# Patient Record
Sex: Female | Born: 1980 | Race: White | Hispanic: No | Marital: Married | Smoking: Never smoker
Health system: Southern US, Community
[De-identification: ages and names within clinical notes are randomized; demographics above are authoritative.]

## PROBLEM LIST (undated history)

## (undated) ENCOUNTER — Ambulatory Visit: Discharge status: Absent without leave | Payer: BC Managed Care – PPO

## (undated) DIAGNOSIS — F509 Eating disorder, unspecified: Secondary | ICD-10-CM

## (undated) DIAGNOSIS — F32A Depression, unspecified: Secondary | ICD-10-CM

## (undated) DIAGNOSIS — F329 Major depressive disorder, single episode, unspecified: Secondary | ICD-10-CM

## (undated) DIAGNOSIS — U071 COVID-19: Secondary | ICD-10-CM

## (undated) DIAGNOSIS — G43909 Migraine, unspecified, not intractable, without status migrainosus: Secondary | ICD-10-CM

## (undated) HISTORY — DX: Depression, unspecified: F32.A

## (undated) HISTORY — DX: Eating disorder, unspecified: F50.9

## (undated) HISTORY — DX: Major depressive disorder, single episode, unspecified: F32.9

## (undated) HISTORY — DX: Migraine, unspecified, not intractable, without status migrainosus: G43.909

## (undated) HISTORY — DX: COVID-19: U07.1

---

## 2004-07-21 ENCOUNTER — Ambulatory Visit: Payer: Self-pay | Admitting: Otolaryngology

## 2004-07-24 HISTORY — PX: TONSILLECTOMY: SUR1361

## 2005-10-30 ENCOUNTER — Ambulatory Visit: Payer: Self-pay | Admitting: Otolaryngology

## 2006-07-13 ENCOUNTER — Ambulatory Visit: Payer: Self-pay | Admitting: Family Medicine

## 2007-02-18 ENCOUNTER — Ambulatory Visit: Payer: Self-pay | Admitting: Gastroenterology

## 2007-07-25 HISTORY — PX: LAPAROSCOPIC GASTRIC BANDING: SHX1100

## 2007-07-25 HISTORY — PX: CHOLECYSTECTOMY: SHX55

## 2012-05-30 LAB — HM PAP SMEAR: HM Pap smear: NORMAL

## 2012-07-24 HISTORY — PX: UPPER GI ENDOSCOPY: SHX6162

## 2012-07-25 ENCOUNTER — Ambulatory Visit: Payer: Self-pay | Admitting: Specialist

## 2012-07-25 DIAGNOSIS — Z01810 Encounter for preprocedural cardiovascular examination: Secondary | ICD-10-CM

## 2012-07-26 ENCOUNTER — Other Ambulatory Visit: Payer: Self-pay | Admitting: Specialist

## 2012-07-26 LAB — COMPREHENSIVE METABOLIC PANEL
Albumin: 3.2 g/dL — ABNORMAL LOW (ref 3.4–5.0)
Alkaline Phosphatase: 71 U/L (ref 50–136)
Anion Gap: 7 (ref 7–16)
BUN: 12 mg/dL (ref 7–18)
Bilirubin,Total: 0.3 mg/dL (ref 0.2–1.0)
Calcium, Total: 8.8 mg/dL (ref 8.5–10.1)
Chloride: 108 mmol/L — ABNORMAL HIGH (ref 98–107)
Co2: 24 mmol/L (ref 21–32)
Creatinine: 0.74 mg/dL (ref 0.60–1.30)
EGFR (African American): >60
EGFR (Non-African Amer.): >60
Glucose: 89 mg/dL (ref 65–99)
Osmolality: 277 (ref 275–301)
Potassium: 3.9 mmol/L (ref 3.5–5.1)
SGOT(AST): 13 U/L — ABNORMAL LOW (ref 15–37)
SGPT (ALT): 19 U/L (ref 12–78)
Sodium: 139 mmol/L (ref 136–145)
Total Protein: 7.5 g/dL (ref 6.4–8.2)

## 2012-07-26 LAB — CBC WITH DIFFERENTIAL/PLATELET
Basophil #: 0 10*3/uL (ref 0.0–0.1)
Basophil %: 0.5 %
Eosinophil #: 0.2 10*3/uL (ref 0.0–0.7)
Eosinophil %: 2.9 %
HCT: 38.8 % (ref 35.0–47.0)
HGB: 12.9 g/dL (ref 12.0–16.0)
Lymphocyte #: 2.2 10*3/uL (ref 1.0–3.6)
Lymphocyte %: 25.7 %
MCH: 26.3 pg (ref 26.0–34.0)
MCHC: 33.3 g/dL (ref 32.0–36.0)
MCV: 79 fL — ABNORMAL LOW (ref 80–100)
Monocyte #: 0.5 x10 3/mm (ref 0.2–0.9)
Monocyte %: 6.1 %
Neutrophil #: 5.4 10*3/uL (ref 1.4–6.5)
Neutrophil %: 64.8 %
Platelet: 319 10*3/uL (ref 150–440)
RBC: 4.91 10*6/uL (ref 3.80–5.20)
RDW: 15.5 % — ABNORMAL HIGH (ref 11.5–14.5)
WBC: 8.4 10*3/uL (ref 3.6–11.0)

## 2012-07-26 LAB — PHOSPHORUS: Phosphorus: 3 mg/dL (ref 2.5–4.9)

## 2012-07-26 LAB — AMYLASE: Amylase: 58 U/L (ref 25–115)

## 2012-07-26 LAB — IRON AND TIBC
Iron Bind.Cap.(Total): 502 ug/dL — ABNORMAL HIGH (ref 250–450)
Iron Saturation: 15 %
Iron: 73 ug/dL (ref 50–170)
Unbound Iron-Bind.Cap.: 429 ug/dL

## 2012-07-26 LAB — FERRITIN: Ferritin (ARMC): 25 ng/mL (ref 8–388)

## 2012-07-26 LAB — TSH: Thyroid Stimulating Horm: 1.42 u[IU]/mL

## 2012-07-26 LAB — FOLATE: Folic Acid: 18.9 ng/mL (ref 3.1–100.0)

## 2012-07-26 LAB — PROTIME-INR
INR: 0.8
Prothrombin Time: 11.5 secs (ref 11.5–14.7)

## 2012-07-26 LAB — APTT: Activated PTT: 24.3 secs (ref 23.6–35.9)

## 2012-07-26 LAB — HEMOGLOBIN A1C: Hemoglobin A1C: 5.3 % (ref 4.2–6.3)

## 2012-07-26 LAB — LIPASE, BLOOD: Lipase: 296 U/L (ref 73–393)

## 2012-07-26 LAB — MAGNESIUM: Magnesium: 2 mg/dL

## 2012-07-26 LAB — BILIRUBIN, DIRECT: Bilirubin, Direct: <0.1 mg/dL (ref 0.00–0.20)

## 2012-08-02 ENCOUNTER — Ambulatory Visit: Payer: Self-pay | Admitting: Specialist

## 2012-08-30 ENCOUNTER — Encounter: Payer: Self-pay | Admitting: Internal Medicine

## 2012-08-30 ENCOUNTER — Ambulatory Visit (INDEPENDENT_AMBULATORY_CARE_PROVIDER_SITE_OTHER): Payer: BC Managed Care – PPO | Admitting: Internal Medicine

## 2012-08-30 DIAGNOSIS — E669 Obesity, unspecified: Secondary | ICD-10-CM | POA: Insufficient documentation

## 2012-08-30 DIAGNOSIS — G43909 Migraine, unspecified, not intractable, without status migrainosus: Secondary | ICD-10-CM

## 2012-08-30 DIAGNOSIS — F32A Depression, unspecified: Secondary | ICD-10-CM | POA: Insufficient documentation

## 2012-08-30 DIAGNOSIS — F3289 Other specified depressive episodes: Secondary | ICD-10-CM

## 2012-08-30 DIAGNOSIS — F329 Major depressive disorder, single episode, unspecified: Secondary | ICD-10-CM

## 2012-08-30 MED ORDER — DESVENLAFAXINE SUCCINATE ER 50 MG PO TB24: 50.0000 mg | ORAL_TABLET | Freq: Every day | ORAL | Status: DC

## 2012-08-30 NOTE — Assessment & Plan Note (Signed)
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 Patient would like to stop medications. Will taper Pristique to 50 mg daily. For now, we'll continue fluoxetine. Patient will followup in one month or sooner as needed.

## 2012-08-30 NOTE — Progress Notes (Signed)
Subjective:    Patient ID: Sue Hernandez, female    DOB: September 20, 1980, 31 y.o.   MRN: [id number]  HPI 32 year old female with history of obesity, depression, and migraine headaches presents to establish care. She reports that she had lap-band surgery several years ago. After the surgery, she experienced persistent nausea and vomiting. Ultimately, the band had to be released and she regained 75lbs. she has now sought the care of another bariatric surgeon and they're planning to do gastric sleeve procedure. She is currently keeping a food diary and is on a six-month schedule of monthly visits prior to her surgical procedure which should be in June.   She also notes a history of chronic depression. She describes this as low level depression. She has been able to function and works as a Runner, broadcasting/film/video. She has been on several medications to help with symptoms. She is currently taking proceed and fluoxetine. She reports minimal improvement with these medications. She is hoping to get off the medicines. She is currently under the care of a psychologist who she reports agrees with her plan to get off medicines. She denies suicidal or homicidal ideation. She has no history of suicide attempts in the past.  She also is a history of migraine headaches. She reports symptoms are very well-controlled with use of propanolol. She denies any recent headaches.  Outpatient Encounter Prescriptions as of 08/30/2012  Medication Sig Dispense Refill  . desvenlafaxine (PRISTIQ) 50 MG 24 hr tablet Take 1 tablet (50 mg total) by mouth daily.  30 tablet  6  . etonogestrel-ethinyl estradiol (NUVARING) 0.12-0.015 MG/24HR vaginal ring Place 1 each vaginally every 28 (twenty-eight) days. Insert vaginally and leave in place for 3 consecutive weeks, then remove for 1 week.      Marland Kitchen FLUoxetine (PROZAC) 10 MG capsule Take 10 mg by mouth Daily.      . Multiple Vitamin (MULTIVITAMIN) capsule Take 1 capsule by mouth daily.      . propranolol  (INDERAL) 10 MG tablet Take 10 mg by mouth Daily.      . vitamin B-12 (CYANOCOBALAMIN) 1000 MCG tablet Take 1,000 mcg by mouth daily.      . [DISCONTINUED] PRISTIQ 100 MG 24 hr tablet Take 100 mg by mouth Daily.       BP 120/79  Pulse 104  Temp 98.3 F (36.8 C) (Oral)  Ht 5\' 4"  (1.626 m)  Wt 244 lb (110.678 kg)  BMI 41.88 kg/m2  SpO2 98%  LMP 08/16/2012  Review of Systems  Constitutional: Negative for fever, chills, appetite change, fatigue and unexpected weight change.  HENT: Negative for ear pain, congestion, sore throat, trouble swallowing, neck pain, voice change and sinus pressure.   Eyes: Negative for visual disturbance.  Respiratory: Negative for cough, shortness of breath, wheezing and stridor.   Cardiovascular: Negative for chest pain, palpitations and leg swelling.  Gastrointestinal: Negative for nausea, vomiting, abdominal pain, diarrhea, constipation, blood in stool, abdominal distention and anal bleeding.  Genitourinary: Negative for dysuria and flank pain.  Musculoskeletal: Negative for myalgias, arthralgias and gait problem.  Skin: Negative for color change and rash.  Neurological: Negative for dizziness and headaches.  Hematological: Negative for adenopathy. Does not bruise/bleed easily.  Psychiatric/Behavioral: Positive for dysphoric mood. Negative for suicidal ideas and sleep disturbance. The patient is not nervous/anxious.        Objective:   Physical Exam  Constitutional: She is oriented to person, place, and time. She appears well-developed and well-nourished. No distress.  HENT:  Head: Normocephalic  and atraumatic.  Right Ear: External ear normal.  Left Ear: External ear normal.  Nose: Nose normal.  Mouth/Throat: Oropharynx is clear and moist. No oropharyngeal exudate.  Eyes: Conjunctivae normal are normal. Pupils are equal, round, and reactive to light. Right eye exhibits no discharge. Left eye exhibits no discharge. No scleral icterus.  Neck: Normal  range of motion. Neck supple. No tracheal deviation present. No thyromegaly present.  Cardiovascular: Normal rate, regular rhythm, normal heart sounds and intact distal pulses.  Exam reveals no gallop and no friction rub.   No murmur heard. Pulmonary/Chest: Effort normal and breath sounds normal. No respiratory distress. She has no wheezes. She has no rales. She exhibits no tenderness.  Musculoskeletal: Normal range of motion. She exhibits no edema and no tenderness.  Lymphadenopathy:    She has no cervical adenopathy.  Neurological: She is alert and oriented to person, place, and time. No cranial nerve deficit. She exhibits normal muscle tone. Coordination normal.  Skin: Skin is warm and dry. No rash noted. She is not diaphoretic. No erythema. No pallor.  Psychiatric: She has a normal mood and affect. Her behavior is normal. Judgment and thought content normal.          Assessment & Plan:

## 2012-08-30 NOTE — Assessment & Plan Note (Signed)
Symptoms well controlled on propanolol. Will continue.

## 2012-08-30 NOTE — Assessment & Plan Note (Signed)
Body mass index is 41.88 kg/(m^2). Patient is planning for gastric sleeve procedure. Will request records on previous evaluation and management from her bariatric surgeon. Discussed CBT to help in weight loss process. Gave reference of Feeling Good by Burns.

## 2012-10-09 ENCOUNTER — Ambulatory Visit (INDEPENDENT_AMBULATORY_CARE_PROVIDER_SITE_OTHER): Payer: BC Managed Care – PPO | Admitting: Internal Medicine

## 2012-10-09 ENCOUNTER — Ambulatory Visit: Payer: BC Managed Care – PPO | Admitting: Internal Medicine

## 2012-10-09 ENCOUNTER — Encounter: Payer: Self-pay | Admitting: Internal Medicine

## 2012-10-09 VITALS — BP 118/80 | HR 78 | Temp 98.2°F | Wt 250.0 lb

## 2012-10-09 DIAGNOSIS — F32A Depression, unspecified: Secondary | ICD-10-CM

## 2012-10-09 DIAGNOSIS — F3289 Other specified depressive episodes: Secondary | ICD-10-CM

## 2012-10-09 DIAGNOSIS — F329 Major depressive disorder, single episode, unspecified: Secondary | ICD-10-CM

## 2012-10-09 NOTE — Assessment & Plan Note (Signed)
Symptoms stable with decrease in Pristiq to 50mg  daily. Pt would like to taper off medication. Will stop medication. Continue Fluoxetine. If any problems coming off Pristiq, pt will call or email. Follow up 1 month and prn.

## 2012-10-09 NOTE — Progress Notes (Signed)
Subjective:    Patient ID: Sue Hernandez, female    DOB: Jun 06, 1981, 31 y.o.   MRN: [id number]  HPI 32 year old female with history of depression and anxiety presents for followup. At her last visit, we tapered down her dose of Pristiq to 50 mg daily. She reports no change in symptoms with tapering of medication. She would like to come off this medication. She would like to see how she deals with symptoms of anxiety and depression without use of medication. She feels that she is coping well at this point. She denies any new concerns today.  Outpatient Encounter Prescriptions as of 10/09/2012  Medication Sig Dispense Refill  . desvenlafaxine (PRISTIQ) 50 MG 24 hr tablet Take 1 tablet (50 mg total) by mouth daily.  30 tablet  6  . etonogestrel-ethinyl estradiol (NUVARING) 0.12-0.015 MG/24HR vaginal ring Place 1 each vaginally every 28 (twenty-eight) days. Insert vaginally and leave in place for 3 consecutive weeks, then remove for 1 week.      Marland Kitchen FLUoxetine (PROZAC) 10 MG capsule Take 10 mg by mouth Daily.      . Multiple Vitamin (MULTIVITAMIN) capsule Take 1 capsule by mouth daily.      . propranolol (INDERAL) 10 MG tablet Take 10 mg by mouth Daily.      . vitamin B-12 (CYANOCOBALAMIN) 1000 MCG tablet Take 1,000 mcg by mouth daily.       No facility-administered encounter medications on file as of 10/09/2012.   BP 118/80  Pulse 78  Temp(Src) 98.2 F (36.8 C) (Oral)  Wt 250 lb (113.399 kg)  BMI 42.89 kg/m2  SpO2 97%  LMP 09/11/2012  Review of Systems  Constitutional: Negative for fever, chills, appetite change, fatigue and unexpected weight change.  HENT: Negative for ear pain, congestion, sore throat, trouble swallowing, neck pain, voice change and sinus pressure.   Eyes: Negative for visual disturbance.  Respiratory: Negative for cough, shortness of breath, wheezing and stridor.   Cardiovascular: Negative for chest pain, palpitations and leg swelling.  Gastrointestinal: Negative for  nausea, vomiting, abdominal pain, diarrhea, constipation, blood in stool, abdominal distention and anal bleeding.  Genitourinary: Negative for dysuria and flank pain.  Musculoskeletal: Negative for myalgias, arthralgias and gait problem.  Skin: Negative for color change and rash.  Neurological: Negative for dizziness and headaches.  Hematological: Negative for adenopathy. Does not bruise/bleed easily.  Psychiatric/Behavioral: Negative for suicidal ideas, sleep disturbance and dysphoric mood. The patient is not nervous/anxious.        Objective:   Physical Exam  Constitutional: She is oriented to person, place, and time. She appears well-developed and well-nourished. No distress.  HENT:  Head: Normocephalic and atraumatic.  Right Ear: External ear normal.  Left Ear: External ear normal.  Nose: Nose normal.  Mouth/Throat: Oropharynx is clear and moist.  Eyes: Conjunctivae are normal. Pupils are equal, round, and reactive to light. Right eye exhibits no discharge. Left eye exhibits no discharge. No scleral icterus.  Neck: Normal range of motion. Neck supple. No tracheal deviation present. No thyromegaly present.  Cardiovascular: Normal rate, regular rhythm, normal heart sounds and intact distal pulses.  Exam reveals no gallop and no friction rub.   No murmur heard. Pulmonary/Chest: Effort normal and breath sounds normal. No respiratory distress. She has no wheezes. She has no rales. She exhibits no tenderness.  Musculoskeletal: Normal range of motion. She exhibits no edema and no tenderness.  Lymphadenopathy:    She has no cervical adenopathy.  Neurological: She is alert and oriented to  person, place, and time. No cranial nerve deficit. She exhibits normal muscle tone. Coordination normal.  Skin: Skin is warm and dry. No rash noted. She is not diaphoretic. No erythema. No pallor.  Psychiatric: She has a normal mood and affect. Her behavior is normal. Judgment and thought content normal.           Assessment & Plan:

## 2012-10-09 NOTE — Patient Instructions (Signed)
Stop Pristique. Follow up with email next week and then visit in 1 month.

## 2012-10-14 ENCOUNTER — Encounter: Payer: Self-pay | Admitting: Internal Medicine

## 2013-01-21 HISTORY — PX: LAPAROSCOPIC GASTRIC SLEEVE RESECTION: SHX5895

## 2013-02-10 ENCOUNTER — Encounter: Payer: Self-pay | Admitting: Adult Health

## 2013-02-10 ENCOUNTER — Ambulatory Visit: Payer: BC Managed Care – PPO | Admitting: Adult Health

## 2013-02-10 ENCOUNTER — Ambulatory Visit (INDEPENDENT_AMBULATORY_CARE_PROVIDER_SITE_OTHER): Payer: BC Managed Care – PPO | Admitting: Adult Health

## 2013-02-10 VITALS — BP 108/60 | HR 86 | Temp 98.1°F | Resp 12 | Wt 240.5 lb

## 2013-02-10 DIAGNOSIS — IMO0002 Reserved for concepts with insufficient information to code with codable children: Secondary | ICD-10-CM

## 2013-02-10 DIAGNOSIS — T8189XA Other complications of procedures, not elsewhere classified, initial encounter: Secondary | ICD-10-CM | POA: Insufficient documentation

## 2013-02-10 MED ORDER — TRIAMCINOLONE ACETONIDE 0.1 % EX CREA: TOPICAL_CREAM | Freq: Two times a day (BID) | CUTANEOUS | Status: DC

## 2013-02-10 MED ORDER — SULFAMETHOXAZOLE-TRIMETHOPRIM 200-40 MG/5ML PO SUSP: 10.0000 mL | Freq: Two times a day (BID) | ORAL | Status: DC

## 2013-02-10 NOTE — Patient Instructions (Addendum)
  Continue the augmentin  I have added another antibiotic - Bactrim. Take 10ml twice a day for 7 days.  Apply triamcinolone cream to the affected areas twice a day.  Please schedule an appointment with your surgeon for evaluation as soon as possible.

## 2013-02-10 NOTE — Assessment & Plan Note (Signed)
Patient is one week status post bariatric surgery who presents to clinic with irritation, hives on abdomen surrounding each incision. She also has diffuse rash. She was started on Augmentin by on-call surgeon. 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 Add Bactrim. Triamcinolone topical twice a day to affected areas causing significant itchiness. Discussed inability to use prednisone as this may impede healing process. Recommend seeing her surgeon as soon as possible. Patient called surgeon's office and obtained appointment for tomorrow at 11:30 AM.

## 2013-02-10 NOTE — Progress Notes (Signed)
  Subjective:    Patient ID: Sue Hernandez, female    DOB: 07/28/1980, 31 y.o.   MRN: [id number]  HPI  Sue Hernandez is a pleasant 32 year old female who presents to clinic status post bariatric surgery one week ago with complaints of redness, itching at the incision sites. She reports that the redness began yesterday morning. She has multiple incisions on her abdomen with redness and itching. She denies fever or drainage. She also reports a diffuse rash throughout her abdomen. She reports calling the surgeon's office but there was no one that was able to see her.   Review of Systems  Constitutional: Negative for fever and chills.  Respiratory: Negative.   Cardiovascular: Negative.   Skin: Positive for rash.       Multiple incisions on abdomen with redness and itching. Diffuse rash throughout abdomen  Neurological: Negative.   Psychiatric/Behavioral: The patient is nervous/anxious.      BP 108/60  Pulse 86  Temp(Src) 98.1 F (36.7 C) (Oral)  Resp 12  Wt 240 lb 8 oz (109.09 kg)  BMI 41.26 kg/m2  SpO2 97%    Objective:   Physical Exam  Constitutional: She is oriented to person, place, and time.  Cardiovascular: Normal rate and regular rhythm.   Pulmonary/Chest: Effort normal. No respiratory distress.  Neurological: She is alert and oriented to person, place, and time.  Skin: Rash noted. There is erythema.  Multiple incisions on the abdomen status post bariatric surgery one week ago with erythema, hives. She also has diffuse rash on the lateral sides of abdomen, lower abdomen. There is no drainage from any incision site. There is good approximation.  Psychiatric: She has a normal mood and affect. Her behavior is normal. Judgment and thought content normal.          Assessment & Plan:

## 2013-04-02 ENCOUNTER — Encounter: Payer: Self-pay | Admitting: Internal Medicine

## 2013-04-02 ENCOUNTER — Ambulatory Visit (INDEPENDENT_AMBULATORY_CARE_PROVIDER_SITE_OTHER): Payer: BC Managed Care – PPO | Admitting: Internal Medicine

## 2013-04-02 VITALS — BP 100/60 | HR 73 | Temp 98.2°F | Ht 64.0 in | Wt 225.0 lb

## 2013-04-02 DIAGNOSIS — F3289 Other specified depressive episodes: Secondary | ICD-10-CM

## 2013-04-02 DIAGNOSIS — F329 Major depressive disorder, single episode, unspecified: Secondary | ICD-10-CM

## 2013-04-02 DIAGNOSIS — F32A Depression, unspecified: Secondary | ICD-10-CM

## 2013-04-02 DIAGNOSIS — G43909 Migraine, unspecified, not intractable, without status migrainosus: Secondary | ICD-10-CM

## 2013-04-02 MED ORDER — PROPRANOLOL HCL 10 MG PO TABS: 10.0000 mg | ORAL_TABLET | Freq: Every day | ORAL | Status: DC

## 2013-04-02 MED ORDER — BUPROPION HCL ER (XL) 150 MG PO TB24: 150.0000 mg | ORAL_TABLET | Freq: Every day | ORAL | Status: DC

## 2013-04-02 NOTE — Assessment & Plan Note (Signed)
Wt Readings from Last 3 Encounters:  04/02/13 225 lb (102.059 kg)  02/10/13 240 lb 8 oz (109.09 kg)  10/09/12 250 lb (113.399 kg)   Body mass index is 38.6 kg/(m^2).  Congratulated pt on weight loss after gastric sleeve procedure. Encouraged her to continue effort at healthy diet and regular physical activity.

## 2013-04-02 NOTE — Progress Notes (Signed)
Subjective:    Patient ID: Sue Hernandez, female    DOB: [birth year], 32 y.o.   MRN: [id number]  HPI 32YO female with h/o depression, migraines, and obesity presents for follow up. Completed gastric sleeve procedure in 01/2013. Has lost 25lb since that time. Doing well with minimal nausea that is controlled by occasional Ondansetron.  Primary concern today increased symptoms of depressed mood and anxiety with ongoing family stressors. Currently her sister and sister's child are living with her. Also notes stress at work. Undergoing counseling and would like to restart Wellbutrin which worked well for her in the past.   Outpatient Encounter Prescriptions as of 04/02/2013  Medication Sig Dispense Refill  . Calcium 1200-1000 MG-UNIT CHEW Chew by mouth.      . etonogestrel-ethinyl estradiol (NUVARING) 0.12-0.015 MG/24HR vaginal ring Place 1 each vaginally every 28 (twenty-eight) days. Insert vaginally and leave in place for 3 consecutive weeks, then remove for 1 week.      . Prenatal Vit-Fe Fumarate-FA (M-VIT) tablet Take 1 tablet by mouth daily.      . propranolol (INDERAL) 10 MG tablet Take 1 tablet (10 mg total) by mouth daily.  30 tablet  6  . VITAMIN D, CHOLECALCIFEROL, PO Take by mouth.      . ondansetron (ZOFRAN-ODT) 4 MG disintegrating tablet        No facility-administered encounter medications on file as of 04/02/2013.   BP 100/60  Pulse 73  Temp(Src) 98.2 F (36.8 C) (Oral)  Ht 5\' 4"  (1.626 m)  Wt 225 lb (102.059 kg)  BMI 38.6 kg/m2  SpO2 97%  LMP 03/17/2013  Review of Systems  Constitutional: Negative for fever, chills, appetite change, fatigue and unexpected weight change.  HENT: Negative for ear pain, congestion, sore throat, trouble swallowing, neck pain, voice change and sinus pressure.   Eyes: Negative for visual disturbance.  Respiratory: Negative for cough, shortness of breath, wheezing and stridor.   Cardiovascular: Negative for chest pain, palpitations and leg  swelling.  Gastrointestinal: Negative for nausea, vomiting, abdominal pain, diarrhea, constipation, blood in stool, abdominal distention and anal bleeding.  Genitourinary: Negative for dysuria and flank pain.  Musculoskeletal: Negative for myalgias, arthralgias and gait problem.  Skin: Negative for color change and rash.  Neurological: Negative for dizziness and headaches.  Hematological: Negative for adenopathy. Does not bruise/bleed easily.  Psychiatric/Behavioral: Positive for dysphoric mood. Negative for suicidal ideas and sleep disturbance. The patient is not nervous/anxious.        Objective:   Physical Exam  Constitutional: She is oriented to person, place, and time. She appears well-developed and well-nourished. No distress.  HENT:  Head: Normocephalic and atraumatic.  Right Ear: External ear normal.  Left Ear: External ear normal.  Nose: Nose normal.  Mouth/Throat: Oropharynx is clear and moist. No oropharyngeal exudate.  Eyes: Conjunctivae are normal. Pupils are equal, round, and reactive to light. Right eye exhibits no discharge. Left eye exhibits no discharge. No scleral icterus.  Neck: Normal range of motion. Neck supple. No tracheal deviation present. No thyromegaly present.  Cardiovascular: Normal rate, regular rhythm, normal heart sounds and intact distal pulses.  Exam reveals no gallop and no friction rub.   No murmur heard. Pulmonary/Chest: Effort normal and breath sounds normal. No accessory muscle usage. Not tachypneic. No respiratory distress. She has no decreased breath sounds. She has no wheezes. She has no rhonchi. She has no rales. She exhibits no tenderness.  Musculoskeletal: Normal range of motion. She exhibits no edema and no tenderness.  Lymphadenopathy:    She has no cervical adenopathy.  Neurological: She is alert and oriented to person, place, and time. No cranial nerve deficit. She exhibits normal muscle tone. Coordination normal.  Skin: Skin is warm and  dry. No rash noted. She is not diaphoretic. No erythema. No pallor.  Psychiatric: She has a normal mood and affect. Her behavior is normal. Judgment and thought content normal. She expresses no suicidal ideation.          Assessment & Plan:

## 2013-04-02 NOTE — Assessment & Plan Note (Signed)
Symptoms well controlled with use of Propranolol 10mg  daily. Discussed that this is very low dose of medication. Pt may try stopping medication to see if any recurrent symptoms of headache.

## 2013-04-02 NOTE — Assessment & Plan Note (Signed)
Recent increased symptoms of depression and anxiety as pt deals with family stressors. Will restart Wellbutrin 150mg  daily. Follow up in 4 weeks or sooner as needed.

## 2013-05-05 ENCOUNTER — Ambulatory Visit: Payer: BC Managed Care – PPO | Admitting: Internal Medicine

## 2013-05-29 ENCOUNTER — Other Ambulatory Visit: Payer: Self-pay

## 2013-06-02 LAB — HM PAP SMEAR

## 2013-08-13 ENCOUNTER — Encounter: Payer: Self-pay | Admitting: Adult Health

## 2013-08-13 ENCOUNTER — Ambulatory Visit (INDEPENDENT_AMBULATORY_CARE_PROVIDER_SITE_OTHER): Payer: BC Managed Care – PPO | Admitting: Adult Health

## 2013-08-13 VITALS — BP 100/66 | HR 78 | Temp 98.3°F | Resp 12 | Wt 195.0 lb

## 2013-08-13 DIAGNOSIS — R509 Fever, unspecified: Secondary | ICD-10-CM

## 2013-08-13 DIAGNOSIS — R6889 Other general symptoms and signs: Secondary | ICD-10-CM

## 2013-08-13 DIAGNOSIS — J029 Acute pharyngitis, unspecified: Secondary | ICD-10-CM

## 2013-08-13 LAB — POCT INFLUENZA A/B
Influenza A, POC: NEGATIVE
Influenza B, POC: NEGATIVE

## 2013-08-13 LAB — POCT RAPID STREP A (OFFICE): Rapid Strep A Screen: NEGATIVE

## 2013-08-13 NOTE — Patient Instructions (Addendum)
  Take tylenol for general discomfort and fever  Flu test was negative  Strep test was negative.  Gargle with salt water. Ice cold drinks may soothe your throat.  Over the counter medications for symptoms such as Chloraseptic spray or lozenges.  If you develop a stuffy nose you can use Afrin for 3 days only.

## 2013-08-13 NOTE — Progress Notes (Signed)
   Subjective:    Patient ID: Sue Hernandez, female    DOB: 07/13/1981, 33 y.o.   MRN: [id number]  HPI  Patient is a pleasant 33 year old female who presents to clinic with flulike symptoms of sore throat, fever, chills and body ache. Symptoms began last night. She presents to clinic for evaluation. Denies cough, rash, shortness of breath.  Current Outpatient Prescriptions on File Prior to Visit  Medication Sig Dispense Refill  . buPROPion (WELLBUTRIN XL) 150 MG 24 hr tablet Take 1 tablet (150 mg total) by mouth daily.  30 tablet  6  . Calcium 1200-1000 MG-UNIT CHEW Chew by mouth.      . etonogestrel-ethinyl estradiol (NUVARING) 0.12-0.015 MG/24HR vaginal ring Place 1 each vaginally every 28 (twenty-eight) days. Insert vaginally and leave in place for 3 consecutive weeks, then remove for 1 week.      . ondansetron (ZOFRAN-ODT) 4 MG disintegrating tablet       . propranolol (INDERAL) 10 MG tablet Take 1 tablet (10 mg total) by mouth daily.  30 tablet  6  . VITAMIN D, CHOLECALCIFEROL, PO Take by mouth.       No current facility-administered medications on file prior to visit.     Review of Systems  Constitutional: Positive for fever and chills.  HENT: Positive for sore throat.   Respiratory: Negative for cough, shortness of breath and wheezing.   Cardiovascular: Negative.   Gastrointestinal: Negative.   Musculoskeletal:       Body aches, malaise  Neurological: Negative.   Psychiatric/Behavioral: Negative.        Objective:   Physical Exam  Constitutional: She is oriented to person, place, and time. She appears well-developed and well-nourished.  Appears acutely ill  HENT:  Head: Normocephalic and atraumatic.  Right Ear: External ear normal.  Left Ear: External ear normal.  Posterior pharynx with significant erythema.  Cardiovascular: Normal rate and regular rhythm.   Pulmonary/Chest: Effort normal. No respiratory distress.  Musculoskeletal: Normal range of motion.    Lymphadenopathy:    She has cervical adenopathy.  Neurological: She is alert and oriented to person, place, and time.  Skin: Skin is warm and dry.  Psychiatric: She has a normal mood and affect. Her behavior is normal. Judgment and thought content normal.          Assessment & Plan:

## 2013-08-13 NOTE — Assessment & Plan Note (Signed)
Negative for flu and strep. Drink fluids. Tylenol for general discomfort and fever. OTC medications for symptoms such as Chloraseptic spray or lozenges.

## 2013-08-13 NOTE — Progress Notes (Signed)
Pre visit review using our clinic review tool, if applicable. No additional management support is needed unless otherwise documented below in the visit note. 

## 2013-09-23 ENCOUNTER — Other Ambulatory Visit: Payer: Self-pay | Admitting: Specialist

## 2013-09-23 LAB — CBC WITH DIFFERENTIAL/PLATELET
Basophil #: 0 10*3/uL (ref 0.0–0.1)
Basophil %: 0.3 %
Eosinophil #: 0.1 10*3/uL (ref 0.0–0.7)
Eosinophil %: 1.7 %
HCT: 37.5 % (ref 35.0–47.0)
HGB: 12.6 g/dL (ref 12.0–16.0)
Lymphocyte #: 1.8 10*3/uL (ref 1.0–3.6)
Lymphocyte %: 26.8 %
MCH: 27.7 pg (ref 26.0–34.0)
MCHC: 33.7 g/dL (ref 32.0–36.0)
MCV: 82 fL (ref 80–100)
Monocyte #: 0.4 x10 3/mm (ref 0.2–0.9)
Monocyte %: 5.7 %
Neutrophil #: 4.3 10*3/uL (ref 1.4–6.5)
Neutrophil %: 65.5 %
Platelet: 310 10*3/uL (ref 150–440)
RBC: 4.55 10*6/uL (ref 3.80–5.20)
RDW: 13.8 % (ref 11.5–14.5)
WBC: 6.6 10*3/uL (ref 3.6–11.0)

## 2013-09-23 LAB — COMPREHENSIVE METABOLIC PANEL
Albumin: 3 g/dL — ABNORMAL LOW (ref 3.4–5.0)
Alkaline Phosphatase: 73 U/L
Anion Gap: 3 — ABNORMAL LOW (ref 7–16)
BUN: 11 mg/dL (ref 7–18)
Bilirubin,Total: 0.3 mg/dL (ref 0.2–1.0)
Calcium, Total: 8.7 mg/dL (ref 8.5–10.1)
Chloride: 110 mmol/L — ABNORMAL HIGH (ref 98–107)
Co2: 25 mmol/L (ref 21–32)
Creatinine: 0.93 mg/dL (ref 0.60–1.30)
EGFR (African American): >60
EGFR (Non-African Amer.): >60
Glucose: 102 mg/dL — ABNORMAL HIGH (ref 65–99)
Osmolality: 275 (ref 275–301)
Potassium: 3.6 mmol/L (ref 3.5–5.1)
SGOT(AST): 17 U/L (ref 15–37)
SGPT (ALT): 18 U/L (ref 12–78)
Sodium: 138 mmol/L (ref 136–145)
Total Protein: 7.1 g/dL (ref 6.4–8.2)

## 2013-09-23 LAB — IRON AND TIBC
Iron Bind.Cap.(Total): 451 ug/dL — ABNORMAL HIGH (ref 250–450)
Iron Saturation: 18 %
Iron: 82 ug/dL (ref 50–170)
Unbound Iron-Bind.Cap.: 369 ug/dL

## 2013-09-23 LAB — MAGNESIUM: Magnesium: 1.9 mg/dL

## 2013-09-23 LAB — PHOSPHORUS: Phosphorus: 3.3 mg/dL (ref 2.5–4.9)

## 2013-09-23 LAB — FOLATE: Folic Acid: 19.6 ng/mL (ref 3.1–100.0)

## 2013-09-23 LAB — FERRITIN: Ferritin (ARMC): 42 ng/mL (ref 8–388)

## 2013-09-23 LAB — AMYLASE: Amylase: 62 U/L (ref 25–115)

## 2013-09-30 ENCOUNTER — Encounter: Payer: Self-pay | Admitting: Internal Medicine

## 2013-09-30 ENCOUNTER — Ambulatory Visit (INDEPENDENT_AMBULATORY_CARE_PROVIDER_SITE_OTHER): Payer: BC Managed Care – PPO | Admitting: Internal Medicine

## 2013-09-30 VITALS — BP 108/68 | HR 63 | Temp 97.9°F | Wt 191.0 lb

## 2013-09-30 DIAGNOSIS — F3289 Other specified depressive episodes: Secondary | ICD-10-CM

## 2013-09-30 DIAGNOSIS — E669 Obesity, unspecified: Secondary | ICD-10-CM

## 2013-09-30 DIAGNOSIS — F329 Major depressive disorder, single episode, unspecified: Secondary | ICD-10-CM

## 2013-09-30 DIAGNOSIS — F32A Depression, unspecified: Secondary | ICD-10-CM

## 2013-09-30 MED ORDER — ESCITALOPRAM OXALATE 5 MG PO TABS: 5.0000 mg | ORAL_TABLET | Freq: Every day | ORAL | Status: DC

## 2013-09-30 NOTE — Progress Notes (Signed)
   Subjective:    Patient ID: Sue Hernandez, female    DOB: 06/12/1981, 33 y.o.   MRN: [id number]  HPI 32YO female with h/o depression and obesity s/p gastric sleeve presents for follow up. Continues to have some nausea, particularly in the morning. Taking Zofran with some improvement. Cannot tolerate protein shakes because of nausea. Sweet foods make nausea worse. Trying to get 60-80gm of protein per day. Eating chicken, cottage cheese. No abdominal pain. Symptoms of depression have recently been well controlled. Notes some decrease in Libido on Wellbutrin and would like to change to Lexapro, as this was recommended by her psychiatrist.     Review of Systems  Constitutional: Negative for fever, chills, appetite change, fatigue and unexpected weight change.  HENT: Negative for congestion, ear pain, sinus pressure, sore throat, trouble swallowing and voice change.   Eyes: Negative for visual disturbance.  Respiratory: Negative for cough, shortness of breath, wheezing and stridor.   Cardiovascular: Negative for chest pain, palpitations and leg swelling.  Gastrointestinal: Positive for nausea. Negative for vomiting, abdominal pain, diarrhea, constipation, blood in stool, abdominal distention and anal bleeding.  Genitourinary: Negative for dysuria and flank pain.  Musculoskeletal: Negative for arthralgias, gait problem, myalgias and neck pain.  Skin: Negative for color change and rash.  Neurological: Negative for dizziness and headaches.  Hematological: Negative for adenopathy. Does not bruise/bleed easily.  Psychiatric/Behavioral: Negative for suicidal ideas, sleep disturbance and dysphoric mood. The patient is not nervous/anxious.        Objective:    BP 108/68  Pulse 63  Temp(Src) 97.9 F (36.6 C) (Oral)  Wt 191 lb (86.637 kg)  SpO2 97%  LMP 09/30/2013 Physical Exam  Constitutional: She is oriented to person, place, and time. She appears well-developed and well-nourished. No  distress.  HENT:  Head: Normocephalic and atraumatic.  Right Ear: External ear normal.  Left Ear: External ear normal.  Nose: Nose normal.  Mouth/Throat: Oropharynx is clear and moist. No oropharyngeal exudate.  Eyes: Conjunctivae are normal. Pupils are equal, round, and reactive to light. Right eye exhibits no discharge. Left eye exhibits no discharge. No scleral icterus.  Neck: Normal range of motion. Neck supple. No tracheal deviation present. No thyromegaly present.  Cardiovascular: Normal rate, regular rhythm, normal heart sounds and intact distal pulses.  Exam reveals no gallop and no friction rub.   No murmur heard. Pulmonary/Chest: Effort normal and breath sounds normal. No accessory muscle usage. Not tachypneic. No respiratory distress. She has no decreased breath sounds. She has no wheezes. She has no rhonchi. She has no rales. She exhibits no tenderness.  Musculoskeletal: Normal range of motion. She exhibits no edema and no tenderness.  Lymphadenopathy:    She has no cervical adenopathy.  Neurological: She is alert and oriented to person, place, and time. No cranial nerve deficit. She exhibits normal muscle tone. Coordination normal.  Skin: Skin is warm and dry. No rash noted. She is not diaphoretic. No erythema. No pallor.  Psychiatric: She has a normal mood and affect. Her speech is normal and behavior is normal. Judgment and thought content normal. Cognition and memory are normal.          Assessment & Plan:   Problem List Items Addressed This Visit   Depression - Primary   Relevant Medications      escitalopram (LEXAPRO) tablet   Obesity (BMI 30-39.9)       Return in about 4 weeks (around 10/28/2013) for Follow up new medication.

## 2013-09-30 NOTE — Assessment & Plan Note (Signed)
Wt Readings from Last 3 Encounters:  09/30/13 191 lb (86.637 kg)  08/13/13 195 lb (88.451 kg)  04/02/13 225 lb (102.059 kg)   Congratulated pt on weight loss. Encouraged continued efforts at healthy diet and exercise. Will request recent labs from her bariatric surgeon.

## 2013-09-30 NOTE — Assessment & Plan Note (Signed)
Symptoms well controlled on Wellbutrin however pt notes decreased libido. We discussed that all SSRIs can have this side effect. Will give trial of Lexapro, starting at 5mg  daily. Follow up 4 weeks and prn.

## 2013-09-30 NOTE — Patient Instructions (Signed)
Email in 1-2 weeks with update regarding Lexapro.

## 2013-10-22 ENCOUNTER — Encounter: Payer: Self-pay | Admitting: *Deleted

## 2013-10-28 ENCOUNTER — Ambulatory Visit: Payer: Self-pay | Admitting: Specialist

## 2013-11-03 ENCOUNTER — Ambulatory Visit (INDEPENDENT_AMBULATORY_CARE_PROVIDER_SITE_OTHER): Payer: BC Managed Care – PPO | Admitting: Internal Medicine

## 2013-11-03 ENCOUNTER — Encounter: Payer: Self-pay | Admitting: Internal Medicine

## 2013-11-03 VITALS — BP 100/70 | HR 73 | Temp 98.2°F | Wt 196.0 lb

## 2013-11-03 DIAGNOSIS — F3289 Other specified depressive episodes: Secondary | ICD-10-CM

## 2013-11-03 DIAGNOSIS — L989 Disorder of the skin and subcutaneous tissue, unspecified: Secondary | ICD-10-CM | POA: Insufficient documentation

## 2013-11-03 DIAGNOSIS — R5383 Other fatigue: Secondary | ICD-10-CM

## 2013-11-03 DIAGNOSIS — F329 Major depressive disorder, single episode, unspecified: Secondary | ICD-10-CM

## 2013-11-03 DIAGNOSIS — R5381 Other malaise: Secondary | ICD-10-CM | POA: Insufficient documentation

## 2013-11-03 DIAGNOSIS — F32A Depression, unspecified: Secondary | ICD-10-CM

## 2013-11-03 LAB — CBC WITH DIFFERENTIAL/PLATELET
Basophils Absolute: 0 10*3/uL (ref 0.0–0.1)
Basophils Relative: 0.5 % (ref 0.0–3.0)
Eosinophils Absolute: 0.1 10*3/uL (ref 0.0–0.7)
Eosinophils Relative: 1.7 % (ref 0.0–5.0)
HCT: 38.8 % (ref 36.0–46.0)
Hemoglobin: 13 g/dL (ref 12.0–15.0)
Lymphocytes Relative: 28.8 % (ref 12.0–46.0)
Lymphs Abs: 2.1 10*3/uL (ref 0.7–4.0)
MCHC: 33.4 g/dL (ref 30.0–36.0)
MCV: 82.6 fl (ref 78.0–100.0)
Monocytes Absolute: 0.4 10*3/uL (ref 0.1–1.0)
Monocytes Relative: 6.1 % (ref 3.0–12.0)
Neutro Abs: 4.6 10*3/uL (ref 1.4–7.7)
Neutrophils Relative %: 62.9 % (ref 43.0–77.0)
Platelets: 309 10*3/uL (ref 150.0–400.0)
RBC: 4.7 Mil/uL (ref 3.87–5.11)
RDW: 13.6 % (ref 11.5–14.6)
WBC: 7.4 10*3/uL (ref 4.5–10.5)

## 2013-11-03 LAB — T4, FREE: Free T4: 0.83 ng/dL (ref 0.60–1.60)

## 2013-11-03 LAB — VITAMIN B12: Vitamin B-12: 438 pg/mL (ref 211–911)

## 2013-11-03 LAB — TSH: TSH: 1.72 u[IU]/mL (ref 0.35–5.50)

## 2013-11-03 MED ORDER — ESCITALOPRAM OXALATE 10 MG PO TABS: 10.0000 mg | ORAL_TABLET | Freq: Every day | ORAL | Status: DC

## 2013-11-03 MED ORDER — NYSTATIN-TRIAMCINOLONE 100000-0.1 UNIT/GM-% EX OINT: 1.0000 "application " | TOPICAL_OINTMENT | Freq: Two times a day (BID) | CUTANEOUS | Status: DC

## 2013-11-03 NOTE — Progress Notes (Signed)
Subjective:    Patient ID: Sue Hernandez, female    DOB: 02/12/1981, 33 y.o.   MRN: [id number]  HPI 32YO female presents for follow up.  Depression - Started Lexapro 5mg  last month. Notes some improvement in moodiness but not complete resolution of symptoms. Would like to increase dose of Lexapro.  Fatigue - Bariatric surgeon recently increased B12 with SL dosing. However, continues to feel extreme fatigue, particularly in the afternoons. No recent illnesses.  Skin lesion - scaling area of left leg x 1.5 weeks. No new lotions or creams. Not applying anything to area.  Review of Systems  Constitutional: Positive for fatigue. Negative for fever, chills, appetite change and unexpected weight change.  HENT: Negative for congestion, ear pain, sinus pressure, sore throat, trouble swallowing and voice change.   Eyes: Negative for visual disturbance.  Respiratory: Negative for cough, shortness of breath, wheezing and stridor.   Cardiovascular: Negative for chest pain, palpitations and leg swelling.  Gastrointestinal: Negative for nausea, vomiting, abdominal pain, diarrhea, constipation, blood in stool, abdominal distention and anal bleeding.  Genitourinary: Negative for dysuria and flank pain.  Musculoskeletal: Negative for arthralgias, gait problem, myalgias and neck pain.  Skin: Positive for color change and rash.  Neurological: Negative for dizziness and headaches.  Hematological: Negative for adenopathy. Does not bruise/bleed easily.  Psychiatric/Behavioral: Positive for dysphoric mood. Negative for suicidal ideas and sleep disturbance. The patient is not nervous/anxious.        Objective:    BP 100/70  Pulse 73  Temp(Src) 98.2 F (36.8 C) (Oral)  Wt 196 lb (88.905 kg)  SpO2 96%  LMP 10/27/2013 Physical Exam  Constitutional: She is oriented to person, place, and time. She appears well-developed and well-nourished. No distress.  HENT:  Head: Normocephalic and atraumatic.    Right Ear: External ear normal.  Left Ear: External ear normal.  Nose: Nose normal.  Mouth/Throat: Oropharynx is clear and moist. No oropharyngeal exudate.  Eyes: Conjunctivae are normal. Pupils are equal, round, and reactive to light. Right eye exhibits no discharge. Left eye exhibits no discharge. No scleral icterus.  Neck: Normal range of motion. Neck supple. No tracheal deviation present. No thyromegaly present.  Cardiovascular: Normal rate, regular rhythm, normal heart sounds and intact distal pulses.  Exam reveals no gallop and no friction rub.   No murmur heard. Pulmonary/Chest: Effort normal and breath sounds normal. No accessory muscle usage. Not tachypneic. No respiratory distress. She has no decreased breath sounds. She has no wheezes. She has no rhonchi. She has no rales. She exhibits no tenderness.  Musculoskeletal: Normal range of motion. She exhibits no edema and no tenderness.  Lymphadenopathy:    She has no cervical adenopathy.  Neurological: She is alert and oriented to person, place, and time. No cranial nerve deficit. She exhibits normal muscle tone. Coordination normal.  Skin: Skin is warm and dry. Lesion noted. No rash noted. She is not diaphoretic. There is erythema. No pallor.     Psychiatric: She has a normal mood and affect. Her behavior is normal. Judgment and thought content normal.          Assessment & Plan:   Problem List Items Addressed This Visit   Depression - Primary     Symptoms improved with Lexapro, but still have room for improvement. Will increase Lexapro to 10mg  daily. Follow up 4 weeks and prn.    Relevant Medications      escitalopram (LEXAPRO) tablet   Other malaise and fatigue  Recent extreme fatigue. Will recheck CBC, B12, TSH with labs today.    Relevant Orders      CBC with Differential      B12      TSH      T4, free   Skin lesion of left leg     Appearance most consistent with tinea corporis versus dermatitis. Will treat  with topical Mycolog. Follow up 4 weeks and prn    Relevant Medications      nystatin-triamcinolone (MYCOLOG) ointment       Return in about 4 weeks (around 12/01/2013) for Recheck.

## 2013-11-03 NOTE — Progress Notes (Signed)
Pre visit review using our clinic review tool, if applicable. No additional management support is needed unless otherwise documented below in the visit note. 

## 2013-11-03 NOTE — Patient Instructions (Addendum)
Increase Lexapro to 10mg  daily.  We will check labs today.

## 2013-11-03 NOTE — Assessment & Plan Note (Signed)
Symptoms improved with Lexapro, but still have room for improvement. Will increase Lexapro to 10mg  daily. Follow up 4 weeks and prn.

## 2013-11-03 NOTE — Assessment & Plan Note (Signed)
Appearance most consistent with tinea corporis versus dermatitis. Will treat with topical Mycolog. Follow up 4 weeks and prn

## 2013-11-03 NOTE — Assessment & Plan Note (Signed)
Recent extreme fatigue. Will recheck CBC, B12, TSH with labs today.

## 2013-11-21 ENCOUNTER — Ambulatory Visit: Payer: Self-pay | Admitting: Specialist

## 2013-11-27 ENCOUNTER — Encounter: Payer: Self-pay | Admitting: Internal Medicine

## 2013-11-27 NOTE — Telephone Encounter (Signed)
Appointment already canceled

## 2013-12-01 ENCOUNTER — Ambulatory Visit: Payer: BC Managed Care – PPO | Admitting: Internal Medicine

## 2014-01-12 ENCOUNTER — Other Ambulatory Visit: Payer: Self-pay | Admitting: Internal Medicine

## 2014-03-27 ENCOUNTER — Other Ambulatory Visit: Payer: Self-pay | Admitting: Internal Medicine

## 2014-04-13 ENCOUNTER — Ambulatory Visit (INDEPENDENT_AMBULATORY_CARE_PROVIDER_SITE_OTHER): Payer: BC Managed Care – PPO | Admitting: Internal Medicine

## 2014-04-13 ENCOUNTER — Encounter: Payer: Self-pay | Admitting: Internal Medicine

## 2014-04-13 VITALS — BP 108/68 | HR 77 | Temp 98.3°F | Wt 201.0 lb

## 2014-04-13 DIAGNOSIS — J0100 Acute maxillary sinusitis, unspecified: Secondary | ICD-10-CM

## 2014-04-13 DIAGNOSIS — T3695XA Adverse effect of unspecified systemic antibiotic, initial encounter: Secondary | ICD-10-CM

## 2014-04-13 DIAGNOSIS — B379 Candidiasis, unspecified: Secondary | ICD-10-CM

## 2014-04-13 MED ORDER — FLUCONAZOLE 150 MG PO TABS: 150.0000 mg | ORAL_TABLET | Freq: Once | ORAL | Status: DC

## 2014-04-13 MED ORDER — AMOXICILLIN-POT CLAVULANATE 875-125 MG PO TABS: 1.0000 | ORAL_TABLET | Freq: Two times a day (BID) | ORAL | Status: DC

## 2014-04-13 NOTE — Progress Notes (Signed)
Subjective:    Patient ID: Sue Hernandez, female    DOB: 08-Nov-1980, 33 y.o.   MRN: [id number]  HPI Patient presents with facial pain and pressure, sore throat, nasal congestion with brown nasal discharge, post nasal drip for the past three weeks.  She repots using a neti pot and tylenol cold and sinus with little relief.  She has had a low grade fever of 99 off and on.  She reports history of sinus infections.  Review of Systems  Past Medical History  Diagnosis Date  . Eating disorder     bulemia  . Migraines   . Depression     Dr. Ardelle Anton    Current Outpatient Prescriptions  Medication Sig Dispense Refill  . escitalopram (LEXAPRO) 10 MG tablet TAKE 1 TABLET (10 MG TOTAL) BY MOUTH DAILY.  30 tablet  3  . etonogestrel-ethinyl estradiol (NUVARING) 0.12-0.015 MG/24HR vaginal ring Place 1 each vaginally every 28 (twenty-eight) days. Insert vaginally and leave in place for 3 consecutive weeks, then remove for 1 week.      . propranolol (INDERAL) 10 MG tablet TAKE 1 TABLET EVERY DAY  30 tablet  2  . VITAMIN D, CHOLECALCIFEROL, PO Take by mouth.       No current facility-administered medications for this visit.    Allergies  Allergen Reactions  . Ibuprofen Swelling    Swelling of face and hands  . Derma Guard [Protective Adhesive Powder]     Dermabond - urticaria    Family History  Problem Relation Age of Onset  . Cancer Paternal Grandmother     Breast  . Heart disease Paternal Grandfather   . Cancer Paternal Grandfather     renal cancer went to his lungs  . Cancer Maternal Grandmother     lung  . Cancer Maternal Grandfather     lung    History   Social History  . Marital Status: Single    Spouse Name: N/A    Number of Children: N/A  . Years of Education: N/A   Occupational History  . Not on file.   Social History Main Topics  . Smoking status: Never Smoker   . Smokeless tobacco: Never Used  . Alcohol Use: Yes     Comment: Maybe once a month  . Drug  Use: No  . Sexual Activity: Not on file   Other Topics Concern  . Not on file   Social History Narrative   Lives in [place] with parents.      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  HEENT: Denies eye pain, eye redness, ear pain, ringing in the ears, wax buildup,  Respiratory: Denies difficulty breathing, shortness of breath,  No other specific complaints in a complete review of systems (except as listed in HPI above).     Objective:   Physical Exam  Wt 201 lb (91.173 kg) Wt Readings from Last 3 Encounters:  04/13/14 201 lb (91.173 kg)  11/03/13 196 lb (88.905 kg)  09/30/13 191 lb (86.637 kg)    General: Appears their stated age, well developed, well nourished in NAD. HEENT: Ears: Tm's gray and intact, normal light reflex; Nose: mucosa pink and moist, septum midline; Throat/Mouth: Slightly erythematous Neck:  Neck supple,no lymphadenopathy noted Cardiovascular: Normal rate and rhythm. S1,S2 noted.  No murmur, rubs or gallops noted.Pulmonary/Chest: Normal effort and positive vesicular  breath sounds. No respiratory distress. No wheezes, rales or ronchi noted.   BMET No results found for this basename: na, k, cl, co2, glucose, bun, creatinine, calcium, gfrnonaa, gfraa    Lipid Panel  No results found for this basename: chol, trig, hdl, cholhdl, vldl, ldlcalc    CBC    Component Value Date/Time   WBC 7.4 11/03/2013 0838   RBC 4.70 11/03/2013 0838   HGB 13.0 11/03/2013 0838   HCT 38.8 11/03/2013 0838   PLT 309.0 11/03/2013 0838   MCV 82.6 11/03/2013 0838   MCHC 33.4 11/03/2013 0838   RDW 13.6 11/03/2013 0838   LYMPHSABS 2.1 11/03/2013 0838   MONOABS 0.4 11/03/2013 0838   EOSABS 0.1 11/03/2013 0838   BASOSABS 0.0 11/03/2013 0838    Hgb A1C No results found for this basename: HGBA1C         Assessment & Plan:  1. Acute maxillary  sinusitis, recurrence not specified - amoxicillin-clavulanate (AUGMENTIN) 875-125 MG per tablet; Take 1 tablet by mouth 2 (two) times daily.  Dispense: 20 tablet; Refill: 0  2. Antibiotic-induced yeast infection  - fluconazole (DIFLUCAN) 150 MG tablet; Take 1 tablet (150 mg total) by mouth once.  Dispense: 1 tablet; Refill: 0

## 2014-04-13 NOTE — Progress Notes (Signed)
Pre visit review using our clinic review tool, if applicable. No additional management support is needed unless otherwise documented below in the visit note. 

## 2014-04-13 NOTE — Progress Notes (Signed)
HPI  Pt presents to the clinic today with c/o facial pain and pressure, nasal congestion and sore throat. She reports this started 3 weeks ago. She is blowing mucous out of her nose. She has run low grade fevers and had chills and body aches. She has tried Tylenol cold and sinus. She has also been using a Environmental consultant. She has had issues with sinus infection in the past. She has not had sick contacts that she is aware of.  Review of Systems    Past Medical History  Diagnosis Date  . Eating disorder     bulemia  . Migraines   . Depression     Dr. Ardelle Anton    Family History  Problem Relation Age of Onset  . Cancer Paternal Grandmother     Breast  . Heart disease Paternal Grandfather   . Cancer Paternal Grandfather     renal cancer went to his lungs  . Cancer Maternal Grandmother     lung  . Cancer Maternal Grandfather     lung    History   Social History  . Marital Status: Single    Spouse Name: N/A    Number of Children: N/A  . Years of Education: N/A   Occupational History  . Not on file.   Social History Main Topics  . Smoking status: Never Smoker   . Smokeless tobacco: Never Used  . Alcohol Use: Yes     Comment: Maybe once a month  . Drug Use: No  . Sexual Activity: Not on file   Other Topics Concern  . Not on file   Social History Narrative   Lives in [place] with parents.      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 Ibuprofen Swelling    Swelling of face and hands  . Derma Guard [Protective Adhesive Powder]     Dermabond - urticaria     Constitutional: Positive headache. Denies fatigue, fever or abrupt weight changes.  HEENT:  Positive facial pain, nasal congestion and sore throat. Denies eye redness, ear pain, ringing in the ears, wax buildup, runny nose or bloody nose. Respiratory:  Denies cough, difficulty breathing or shortness of breath.  Cardiovascular:  Denies chest pain, chest tightness, palpitations or swelling in the hands or feet.   No other specific complaints in a complete review of systems (except as listed in HPI above).  Objective:  BP 108/68  Pulse 77  Temp(Src) 98.3 F (36.8 C) (Oral)  Wt 201 lb (91.173 kg)  SpO2 98%   General: Appears his stated age, well developed, well nourished in NAD. HEENT: Head: normal shape and size, maxillary sinus tenderness noted; Eyes: sclera white, no icterus, conjunctiva pink, PERRLA and EOMs intact; Ears: Tm's gray and intact, normal light reflex; Nose: mucosa pink and moist, septum midline; Throat/Mouth: + PND. Teeth present, mucosa erythematous and moist, no exudate noted, no lesions or ulcerations noted.  Cardiovascular: Normal rate and rhythm. S1,S2 noted.  No murmur, rubs or gallops noted. No JVD or BLE edema. No carotid bruits noted. Pulmonary/Chest: Normal effort and positive vesicular breath sounds. No respiratory distress. No wheezes, rales or ronchi noted.      Assessment & Plan:   Acute bacterial sinusitis  Can use a Neti Pot which can be purchased from your local drug store. Flonase 2 sprays each nostril for 3 days and then as needed. Augmentin  BID for 10 days Diflucan for antibiotic induced yeast  RTC as needed or if symptoms persist.

## 2014-04-13 NOTE — Patient Instructions (Signed)

## 2014-04-20 ENCOUNTER — Other Ambulatory Visit: Payer: Self-pay | Admitting: *Deleted

## 2014-04-20 DIAGNOSIS — B379 Candidiasis, unspecified: Secondary | ICD-10-CM

## 2014-04-20 DIAGNOSIS — T3695XA Adverse effect of unspecified systemic antibiotic, initial encounter: Principal | ICD-10-CM

## 2014-04-20 MED ORDER — FLUCONAZOLE 150 MG PO TABS: 150.0000 mg | ORAL_TABLET | Freq: Once | ORAL | Status: DC

## 2014-04-20 NOTE — Telephone Encounter (Signed)
Pt was prescribed Diflucan last week to take after abx, she says she needs a 2nd dose. Ok to refill?

## 2014-04-21 NOTE — Telephone Encounter (Signed)
I called and lmom that rx was sent in.

## 2014-05-07 ENCOUNTER — Other Ambulatory Visit: Payer: Self-pay | Admitting: Internal Medicine

## 2014-06-16 ENCOUNTER — Ambulatory Visit (INDEPENDENT_AMBULATORY_CARE_PROVIDER_SITE_OTHER): Payer: BC Managed Care – PPO | Admitting: *Deleted

## 2014-06-16 ENCOUNTER — Ambulatory Visit (INDEPENDENT_AMBULATORY_CARE_PROVIDER_SITE_OTHER): Payer: BC Managed Care – PPO | Admitting: Internal Medicine

## 2014-06-16 ENCOUNTER — Encounter: Payer: Self-pay | Admitting: Internal Medicine

## 2014-06-16 VITALS — BP 106/60 | HR 75 | Temp 98.3°F | Ht 64.0 in | Wt 201.5 lb

## 2014-06-16 DIAGNOSIS — F32A Depression, unspecified: Secondary | ICD-10-CM

## 2014-06-16 DIAGNOSIS — Z23 Encounter for immunization: Secondary | ICD-10-CM

## 2014-06-16 DIAGNOSIS — F329 Major depressive disorder, single episode, unspecified: Secondary | ICD-10-CM

## 2014-06-16 DIAGNOSIS — E669 Obesity, unspecified: Secondary | ICD-10-CM

## 2014-06-16 DIAGNOSIS — G43009 Migraine without aura, not intractable, without status migrainosus: Secondary | ICD-10-CM

## 2014-06-16 MED ORDER — ESCITALOPRAM OXALATE 10 MG PO TABS: ORAL_TABLET | ORAL | Status: DC

## 2014-06-16 MED ORDER — PROPRANOLOL HCL 10 MG PO TABS: 10.0000 mg | ORAL_TABLET | Freq: Every day | ORAL | Status: DC

## 2014-06-16 NOTE — Assessment & Plan Note (Signed)
Wt Readings from Last 3 Encounters:  06/16/14 201 lb 8 oz (91.4 kg)  04/13/14 201 lb (91.173 kg)  11/03/13 196 lb (88.905 kg)   Body mass index is 34.57 kg/(m^2). Encourage healthy diet and exercise.

## 2014-06-16 NOTE — Progress Notes (Signed)
Pre visit review using our clinic review tool, if applicable. No additional management support is needed unless otherwise documented below in the visit note. 

## 2014-06-16 NOTE — Progress Notes (Signed)
   Subjective:    Patient ID: Sue Hernandez, female    DOB: 01/31/1981, 33 y.o.   MRN: [id number]  HPI 33YO female presents for follow up.  Had PAP today with Acquanetta BellingAngela Lugiano. Results pending.  Symptoms of depression well controlled with lexapro.  No recent migraine headaches with use of Propranolol.  Feeling well. No concerns today.   Review of Systems  Constitutional: Negative for fever, chills, appetite change, fatigue and unexpected weight change.  Eyes: Negative for visual disturbance.  Respiratory: Negative for shortness of breath.   Cardiovascular: Negative for chest pain and leg swelling.  Gastrointestinal: Negative for nausea, vomiting, abdominal pain, diarrhea and constipation.  Skin: Negative for color change and rash.  Hematological: Negative for adenopathy. Does not bruise/bleed easily.  Psychiatric/Behavioral: Negative for dysphoric mood. The patient is not nervous/anxious.        Objective:    BP 106/60 mmHg  Pulse 75  Temp(Src) 98.3 F (36.8 C) (Oral)  Ht 5\' 4"  (1.626 m)  Wt 201 lb 8 oz (91.4 kg)  BMI 34.57 kg/m2  SpO2 97%  LMP 05/16/2014 Physical Exam  Constitutional: She is oriented to person, place, and time. She appears well-developed and well-nourished. No distress.  HENT:  Head: Normocephalic and atraumatic.  Right Ear: External ear normal.  Left Ear: External ear normal.  Nose: Nose normal.  Mouth/Throat: Oropharynx is clear and moist. No oropharyngeal exudate.  Eyes: Conjunctivae are normal. Pupils are equal, round, and reactive to light. Right eye exhibits no discharge. Left eye exhibits no discharge. No scleral icterus.  Neck: Normal range of motion. Neck supple. No tracheal deviation present. No thyromegaly present.  Cardiovascular: Normal rate, regular rhythm, normal heart sounds and intact distal pulses.  Exam reveals no gallop and no friction rub.   No murmur heard. Pulmonary/Chest: Effort normal and breath sounds normal. No accessory  muscle usage. No tachypnea. No respiratory distress. She has no decreased breath sounds. She has no wheezes. She has no rhonchi. She has no rales. She exhibits no tenderness.  Musculoskeletal: Normal range of motion. She exhibits no edema or tenderness.  Lymphadenopathy:    She has no cervical adenopathy.  Neurological: She is alert and oriented to person, place, and time. No cranial nerve deficit. She exhibits normal muscle tone. Coordination normal.  Skin: Skin is warm and dry. No rash noted. She is not diaphoretic. No erythema. No pallor.  Psychiatric: She has a normal mood and affect. Her behavior is normal. Judgment and thought content normal.          Assessment & Plan:   Problem List Items Addressed This Visit      Unprioritized   Depression - Primary    Symptoms well controlled on Lexapro. Will continue.     Relevant Medications      escitalopram (LEXAPRO) tablet   Migraine    Symptoms well controlled on Propranolol. Will continue.    Relevant Medications      propranolol (INDERAL) IR tablet      escitalopram (LEXAPRO) tablet   Obesity (BMI 30-39.9)    Wt Readings from Last 3 Encounters:  06/16/14 201 lb 8 oz (91.4 kg)  04/13/14 201 lb (91.173 kg)  11/03/13 196 lb (88.905 kg)   Body mass index is 34.57 kg/(m^2). Encourage healthy diet and exercise.        No Follow-up on file.

## 2014-06-16 NOTE — Assessment & Plan Note (Signed)
Symptoms well controlled on Propranolol. Will continue.

## 2014-06-16 NOTE — Assessment & Plan Note (Signed)
Symptoms well controlled on Lexapro. Will continue. 

## 2014-06-16 NOTE — Patient Instructions (Signed)
Follow up in 1 year.

## 2014-09-11 ENCOUNTER — Encounter: Payer: Self-pay | Admitting: Internal Medicine

## 2014-09-22 ENCOUNTER — Other Ambulatory Visit (INDEPENDENT_AMBULATORY_CARE_PROVIDER_SITE_OTHER): Payer: BC Managed Care – PPO

## 2014-09-22 ENCOUNTER — Telehealth: Payer: Self-pay | Admitting: *Deleted

## 2014-09-22 DIAGNOSIS — E669 Obesity, unspecified: Secondary | ICD-10-CM

## 2014-09-22 LAB — CBC WITH DIFFERENTIAL/PLATELET
Basophils Absolute: 0 10*3/uL (ref 0.0–0.1)
Basophils Relative: 0.7 % (ref 0.0–3.0)
Eosinophils Absolute: 0.1 10*3/uL (ref 0.0–0.7)
Eosinophils Relative: 2.2 % (ref 0.0–5.0)
HCT: 40.7 % (ref 36.0–46.0)
Hemoglobin: 13.5 g/dL (ref 12.0–15.0)
Lymphocytes Relative: 31.3 % (ref 12.0–46.0)
Lymphs Abs: 1.8 10*3/uL (ref 0.7–4.0)
MCHC: 33.2 g/dL (ref 30.0–36.0)
MCV: 81.8 fl (ref 78.0–100.0)
Monocytes Absolute: 0.4 10*3/uL (ref 0.1–1.0)
Monocytes Relative: 6.5 % (ref 3.0–12.0)
Neutro Abs: 3.4 10*3/uL (ref 1.4–7.7)
Neutrophils Relative %: 59.3 % (ref 43.0–77.0)
Platelets: 224 10*3/uL (ref 150.0–400.0)
RBC: 4.97 Mil/uL (ref 3.87–5.11)
RDW: 13.6 % (ref 11.5–15.5)
WBC: 5.7 10*3/uL (ref 4.0–10.5)

## 2014-09-22 LAB — COMPREHENSIVE METABOLIC PANEL
ALT: 13 U/L (ref 0–35)
AST: 12 U/L (ref 0–37)
Albumin: 3.8 g/dL (ref 3.5–5.2)
Alkaline Phosphatase: 49 U/L (ref 39–117)
BUN: 9 mg/dL (ref 6–23)
CO2: 25 mEq/L (ref 19–32)
Calcium: 9.5 mg/dL (ref 8.4–10.5)
Chloride: 108 mEq/L (ref 96–112)
Creatinine, Ser: 0.83 mg/dL (ref 0.40–1.20)
GFR: 84.41 mL/min (ref >60.00)
Glucose, Bld: 123 mg/dL — ABNORMAL HIGH (ref 70–99)
Potassium: 3.9 mEq/L (ref 3.5–5.1)
Sodium: 140 mEq/L (ref 135–145)
Total Bilirubin: 0.3 mg/dL (ref 0.2–1.2)
Total Protein: 6.7 g/dL (ref 6.0–8.3)

## 2014-09-22 LAB — HEMOGLOBIN A1C: Hgb A1c MFr Bld: 5.6 % (ref 4.6–6.5)

## 2014-09-22 LAB — LIPID PANEL
Cholesterol: 176 mg/dL (ref 0–200)
HDL: 51.1 mg/dL (ref >39.00)
LDL Cholesterol: 86 mg/dL (ref 0–99)
NonHDL: 124.9
Total CHOL/HDL Ratio: 3
Triglycerides: 197 mg/dL — ABNORMAL HIGH (ref 0.0–149.0)
VLDL: 39.4 mg/dL (ref 0.0–40.0)

## 2014-09-22 LAB — VITAMIN B12: Vitamin B-12: 470 pg/mL (ref 211–911)

## 2014-09-22 LAB — TSH: TSH: 1.9 u[IU]/mL (ref 0.35–4.50)

## 2014-09-22 NOTE — Telephone Encounter (Signed)
B12, TSH, CMP, CBC, lipids, A1c obesity

## 2014-09-22 NOTE — Telephone Encounter (Signed)
Labs and dx?  

## 2014-11-07 ENCOUNTER — Encounter: Payer: Self-pay | Admitting: Internal Medicine

## 2014-11-11 ENCOUNTER — Encounter: Payer: Self-pay | Admitting: Internal Medicine

## 2014-11-11 ENCOUNTER — Ambulatory Visit (INDEPENDENT_AMBULATORY_CARE_PROVIDER_SITE_OTHER): Payer: BC Managed Care – PPO | Admitting: Internal Medicine

## 2014-11-11 VITALS — BP 96/68 | HR 84 | Temp 98.6°F | Ht 64.0 in | Wt 203.0 lb

## 2014-11-11 DIAGNOSIS — G43009 Migraine without aura, not intractable, without status migrainosus: Secondary | ICD-10-CM | POA: Diagnosis not present

## 2014-11-11 DIAGNOSIS — B001 Herpesviral vesicular dermatitis: Secondary | ICD-10-CM | POA: Diagnosis not present

## 2014-11-11 DIAGNOSIS — F32A Depression, unspecified: Secondary | ICD-10-CM

## 2014-11-11 DIAGNOSIS — F329 Major depressive disorder, single episode, unspecified: Secondary | ICD-10-CM | POA: Diagnosis not present

## 2014-11-11 MED ORDER — VALACYCLOVIR HCL 1 G PO TABS: 1000.0000 mg | ORAL_TABLET | Freq: Two times a day (BID) | ORAL | Status: DC

## 2014-11-11 MED ORDER — ESCITALOPRAM OXALATE 20 MG PO TABS: ORAL_TABLET | ORAL | Status: DC

## 2014-11-11 NOTE — Progress Notes (Signed)
   Subjective:    Patient ID: [name], female    DOB: 02/11/1981, 34 y.o.   MRN: [id number]  HPI  34YO female presents for acute visit.  Depression - Worsening depressed mood. Apathy. Sleeping more. Decreased concentration. Inability to focus. Taking 15mg  Lexapro last few days.  Having more cold sores. Would like to start back on Valtrex.  Migraine - Has loss of peripheral vision with migraine. On Nuvaring for birth control  Past medical, surgical, family  and social history per today's encounter.  Review of Systems  Constitutional: Negative for fever, chills, appetite change, fatigue and unexpected weight change.  Eyes: Negative for visual disturbance.  Respiratory: Negative for shortness of breath.   Cardiovascular: Negative for chest pain and leg swelling.  Gastrointestinal: Negative for abdominal pain.  Skin: Negative for color change and rash.  Hematological: Negative for adenopathy. Does not bruise/bleed easily.  Psychiatric/Behavioral: Positive for dysphoric mood. The patient is not nervous/anxious.        Objective:    BP 96/68 mmHg  Pulse 84  Temp(Src) 98.6 F (37 C) (Oral)  Ht 5\' 4"  (1.626 m)  Wt 203 lb (92.08 kg)  BMI 34.83 kg/m2  SpO2 99%  LMP 11/06/2014 Physical Exam  Constitutional: She is oriented to person, place, and time. She appears well-developed and well-nourished. No distress.  HENT:  Head: Normocephalic and atraumatic.  Right Ear: External ear normal.  Left Ear: External ear normal.  Nose: Nose normal.  Mouth/Throat: Oropharynx is clear and moist. No oropharyngeal exudate.  Eyes: Conjunctivae are normal. Pupils are equal, round, and reactive to light. Right eye exhibits no discharge. Left eye exhibits no discharge. No scleral icterus.  Neck: Normal range of motion. Neck supple. No tracheal deviation present. No thyromegaly present.  Cardiovascular: Normal rate, regular rhythm, normal heart sounds and intact distal pulses.  Exam reveals  no gallop and no friction rub.   No murmur heard. Pulmonary/Chest: Effort normal and breath sounds normal. No respiratory distress. She has no wheezes. She has no rales. She exhibits no tenderness.  Musculoskeletal: Normal range of motion. She exhibits no edema or tenderness.  Lymphadenopathy:    She has no cervical adenopathy.  Neurological: She is alert and oriented to person, place, and time. No cranial nerve deficit. She exhibits normal muscle tone. Coordination normal.  Skin: Skin is warm and dry. No rash noted. She is not diaphoretic. No erythema. No pallor.  Psychiatric: Her speech is normal and behavior is normal. Judgment and thought content normal. She exhibits a depressed mood. She expresses no suicidal ideation.          Assessment & Plan:   Problem List Items Addressed This Visit      Unprioritized   Cold sore    Will restart prn Valtrex.      Relevant Medications   valACYclovir (VALTREX) 1000 MG tablet   Depression - Primary    Worsening depression. Will increase Lexapro to 20mg  daily. Continue counseling. Follow up in 2 weeks and prn.      Relevant Medications   escitalopram (LEXAPRO) 20 MG tablet   Migraine    Reviewed CDC guidelines about avoidance of combined contraceptives in women with migraine with aura. She will discuss with her OB.      Relevant Medications   escitalopram (LEXAPRO) 20 MG tablet       Return in about 2 weeks (around 11/25/2014) for Recheck.

## 2014-11-11 NOTE — Assessment & Plan Note (Signed)
Will restart prn Valtrex.

## 2014-11-11 NOTE — Assessment & Plan Note (Signed)
Worsening depression. Will increase Lexapro to 20mg  daily. Continue counseling. Follow up in 2 weeks and prn.

## 2014-11-11 NOTE — Progress Notes (Signed)
Pre visit review using our clinic review tool, if applicable. No additional management support is needed unless otherwise documented below in the visit note. 

## 2014-11-11 NOTE — Patient Instructions (Addendum)
Increase Lexapro to 20mg  daily.  Follow up in 2 weeks or sooner as needed.

## 2014-11-11 NOTE — Assessment & Plan Note (Signed)
Reviewed CDC guidelines about avoidance of combined contraceptives in women with migraine with aura. She will discuss with her OB.

## 2014-12-02 ENCOUNTER — Encounter: Payer: Self-pay | Admitting: Internal Medicine

## 2014-12-02 ENCOUNTER — Ambulatory Visit (INDEPENDENT_AMBULATORY_CARE_PROVIDER_SITE_OTHER): Payer: BC Managed Care – PPO | Admitting: Internal Medicine

## 2014-12-02 VITALS — BP 110/76 | HR 82 | Temp 98.4°F | Ht 64.0 in | Wt 203.2 lb

## 2014-12-02 DIAGNOSIS — E669 Obesity, unspecified: Secondary | ICD-10-CM

## 2014-12-02 DIAGNOSIS — M545 Low back pain, unspecified: Secondary | ICD-10-CM | POA: Insufficient documentation

## 2014-12-02 DIAGNOSIS — F32A Depression, unspecified: Secondary | ICD-10-CM

## 2014-12-02 DIAGNOSIS — M5442 Lumbago with sciatica, left side: Secondary | ICD-10-CM

## 2014-12-02 DIAGNOSIS — F329 Major depressive disorder, single episode, unspecified: Secondary | ICD-10-CM

## 2014-12-02 NOTE — Assessment & Plan Note (Signed)
Wt Readings from Last 3 Encounters:  12/02/14 203 lb 4 oz (92.194 kg)  11/11/14 203 lb (92.08 kg)  06/16/14 201 lb 8 oz (91.4 kg)   Body mass index is 34.87 kg/(m^2). Encouraged healthy diet and exercise such as water activity.

## 2014-12-02 NOTE — Assessment & Plan Note (Signed)
Pt reports xray showed compression fracture. She will request a copy of these for us to review. Continue prn Naprosyn and decompression therapy. Follow up prn.

## 2014-12-02 NOTE — Assessment & Plan Note (Signed)
Symptoms improved. Continue Lexapro and counseling.

## 2014-12-02 NOTE — Progress Notes (Signed)
Subjective:    Patient ID: Sue Hernandez, female    DOB: 11/20/1980, 34 y.o.   MRN: [id number]  HPI  33YO female presents for follow up.  Last seen 4/20 for worsening depression. Dose of Lexapro increased to 20mg  daily. Symptoms much improved with higher dose of Lexapro. No side effects noted. Continues with counseling. 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  Left hip pain - over last 3 months, "searing" pain. Radiating down leg to knee. Worse with weight bearing or walking. Limited ability to walk and do other cardio. Seen by chiropractor with some intermittent improvement. Xray showed compression at L5-S1 by her report. Had previous injury with falls, so questions if this may have caused compression. Started decompression therapy. Pain has improved. Taking occasional Naproxen for pain.    Past medical, surgical, family and social history per today's encounter.  Review of Systems  Constitutional: Negative for fever, chills, appetite change, fatigue and unexpected weight change.  Eyes: Negative for visual disturbance.  Respiratory: Negative for shortness of breath.   Cardiovascular: Negative for chest pain and leg swelling.  Gastrointestinal: Negative for abdominal pain.  Musculoskeletal: Positive for myalgias, back pain and arthralgias.  Skin: Negative for color change and rash.  Hematological: Negative for adenopathy. Does not bruise/bleed easily.  Psychiatric/Behavioral: Negative for sleep disturbance and dysphoric mood. The patient is not nervous/anxious.        Objective:    BP 110/76 mmHg  Pulse 82  Temp(Src) 98.4 F (36.9 C) (Oral)  Ht 5\' 4"  (1.626 m)  Wt 203 lb 4 oz (92.194 kg)  BMI 34.87 kg/m2  SpO2 97%  LMP 11/06/2014 Physical Exam  Constitutional: She is oriented to person, place, and time. She appears well-developed and well-nourished. No distress.  HENT:  Head: Normocephalic and atraumatic.  Right Ear: External ear normal.  Left Ear:  External ear normal.  Nose: Nose normal.  Mouth/Throat: Oropharynx is clear and moist. No oropharyngeal exudate.  Eyes: Conjunctivae are normal. Pupils are equal, round, and reactive to light. Right eye exhibits no discharge. Left eye exhibits no discharge. No scleral icterus.  Neck: Normal range of motion. Neck supple. No tracheal deviation present. No thyromegaly present.  Cardiovascular: Normal rate, regular rhythm, normal heart sounds and intact distal pulses.  Exam reveals no gallop and no friction rub.   No murmur heard. Pulmonary/Chest: Effort normal and breath sounds normal. No respiratory distress. She has no wheezes. She has no rales. She exhibits no tenderness.  Musculoskeletal: She exhibits no edema or tenderness.       Lumbar back: She exhibits normal range of motion, no tenderness and no pain.  Lymphadenopathy:    She has no cervical adenopathy.  Neurological: She is alert and oriented to person, place, and time. No cranial nerve deficit. She exhibits normal muscle tone. Coordination normal.  Skin: Skin is warm and dry. No rash noted. She is not diaphoretic. No erythema. No pallor.  Psychiatric: She has a normal mood and affect. Her behavior is normal. Judgment and thought content normal.          Assessment & Plan:   Problem List Items Addressed This Visit      Unprioritized   Depression - Primary    Symptoms improved. Continue Lexapro and counseling.      Lumbago    Pt reports xray showed compression fracture. She will request a copy of these for us to review. Continue prn Naprosyn and decompression therapy. Follow up  prn.      Obesity (BMI 30-39.9)    Wt Readings from Last 3 Encounters:  12/02/14 203 lb 4 oz (92.194 kg)  11/11/14 203 lb (92.08 kg)  06/16/14 201 lb 8 oz (91.4 kg)   Body mass index is 34.87 kg/(m^2). Encouraged healthy diet and exercise such as water activity.          Return in about 6 months (around 06/04/2015) for Recheck.

## 2014-12-02 NOTE — Patient Instructions (Signed)
Follow up 6 months and prn.

## 2014-12-02 NOTE — Progress Notes (Signed)
Pre visit review using our clinic review tool, if applicable. No additional management support is needed unless otherwise documented below in the visit note. 

## 2014-12-09 ENCOUNTER — Ambulatory Visit (INDEPENDENT_AMBULATORY_CARE_PROVIDER_SITE_OTHER): Payer: BC Managed Care – PPO | Admitting: Nurse Practitioner

## 2014-12-09 VITALS — BP 108/60 | HR 86 | Temp 98.3°F | Resp 16 | Ht 63.0 in | Wt 202.8 lb

## 2014-12-09 DIAGNOSIS — N898 Other specified noninflammatory disorders of vagina: Secondary | ICD-10-CM | POA: Diagnosis not present

## 2014-12-09 DIAGNOSIS — Z139 Encounter for screening, unspecified: Secondary | ICD-10-CM | POA: Diagnosis not present

## 2014-12-09 NOTE — Progress Notes (Signed)
   Subjective:    Patient ID: Sue Hernandez, female    DOB: 03/19/1981, 34 y.o.   MRN: [id number]  HPI  Sue Hernandez is a 34 yo female with a CC of vaginitis x 3 days.   1) No antibiotics recently, period last week. Itchy, burning, and increase in clear discharge. She describes burning on the external vulva.   Review of Systems  Constitutional: Negative for fever, chills, diaphoresis and fatigue.  Eyes: Negative for visual disturbance.  Gastrointestinal: Negative for nausea, vomiting and diarrhea.  Genitourinary: Positive for vaginal discharge. Negative for dysuria, urgency, frequency, hematuria, flank pain, difficulty urinating, menstrual problem and pelvic pain.       Vaginal irritation with burning  Skin: Negative for rash.      Objective:   Physical Exam  Constitutional:  BP 108/60 mmHg  Pulse 86  Temp(Src) 98.3 F (36.8 C)  Resp 16  Ht 5\' 3"  (1.6 m)  Wt 202 lb 12.8 oz (91.989 kg)  BMI 35.93 kg/m2  SpO2 98%  LMP 11/06/2014   Genitourinary: Vagina normal. No vaginal discharge found.  Did not visualize any abnormalities on vaginal exam.   Skin: Skin is warm and dry. No rash noted. No erythema. No pallor.  Psychiatric: She has a normal mood and affect. Her behavior is normal. Judgment and thought content normal.      Assessment & Plan:

## 2014-12-09 NOTE — Progress Notes (Signed)
Pre visit review using our clinic review tool, if applicable. No additional management support is needed unless otherwise documented below in the visit note. 

## 2014-12-10 ENCOUNTER — Encounter: Payer: Self-pay | Admitting: Nurse Practitioner

## 2014-12-10 LAB — WET PREP BY MOLECULAR PROBE
Candida species: NEGATIVE
Gardnerella vaginalis: NEGATIVE
Trichomonas vaginosis: NEGATIVE

## 2014-12-15 ENCOUNTER — Ambulatory Visit: Payer: BC Managed Care – PPO | Admitting: Nurse Practitioner

## 2014-12-26 ENCOUNTER — Encounter: Payer: Self-pay | Admitting: Nurse Practitioner

## 2014-12-26 DIAGNOSIS — N898 Other specified noninflammatory disorders of vagina: Secondary | ICD-10-CM | POA: Insufficient documentation

## 2014-12-26 NOTE — Assessment & Plan Note (Signed)
Wet prep performed today. Worsening symptoms over 3 days. Will follow up after results.

## 2015-02-04 LAB — HEPATIC FUNCTION PANEL
ALT: 20 U/L (ref 7–35)
AST: 14 U/L (ref 13–35)
Alkaline Phosphatase: 61 U/L (ref 25–125)
Bilirubin, Total: 0.2 mg/dL

## 2015-02-04 LAB — BASIC METABOLIC PANEL
BUN: 10 mg/dL (ref 4–21)
Creatinine: 0.8 mg/dL (ref 0.5–1.1)
Glucose: 98 mg/dL
Potassium: 4.1 mmol/L (ref 3.4–5.3)
Sodium: 142 mmol/L (ref 137–147)

## 2015-02-04 LAB — CBC AND DIFFERENTIAL
HCT: 38 % (ref 36–46)
Hemoglobin: 12.8 g/dL (ref 12.0–16.0)
Neutrophils Absolute: 4 /uL
Platelets: 279 10*3/uL (ref 150–399)
WBC: 6.5 10^3/mL

## 2015-02-04 LAB — TSH: TSH: 2.18 u[IU]/mL (ref 0.41–5.90)

## 2015-02-04 LAB — HEMOGLOBIN A1C: Hemoglobin A1C: 5.5

## 2015-03-12 ENCOUNTER — Encounter: Payer: Self-pay | Admitting: Family Medicine

## 2015-03-12 ENCOUNTER — Ambulatory Visit (INDEPENDENT_AMBULATORY_CARE_PROVIDER_SITE_OTHER): Payer: BC Managed Care – PPO | Admitting: Family Medicine

## 2015-03-12 VITALS — BP 116/74 | HR 93 | Temp 98.3°F | Ht 63.0 in | Wt 204.8 lb

## 2015-03-12 DIAGNOSIS — R05 Cough: Secondary | ICD-10-CM | POA: Diagnosis not present

## 2015-03-12 DIAGNOSIS — R059 Cough, unspecified: Secondary | ICD-10-CM | POA: Insufficient documentation

## 2015-03-12 MED ORDER — GUAIFENESIN-CODEINE 100-10 MG/5ML PO SYRP: 5.0000 mL | ORAL_SOLUTION | Freq: Four times a day (QID) | ORAL | Status: DC | PRN

## 2015-03-12 NOTE — Progress Notes (Addendum)
Patient ID: Bascom Levels, female   DOB: 15-Nov-1980, 35 y.o.   MRN: [id number]  Marikay Alar, MD Phone: [phone]  Sue Hernandez is a 34 y.o. female who presents today for same day appointment.  Cough: patient reports cough, sore throat, sinus congestion and hoarseness since last Friday. Was seen on 8/14 at [facility] and started on omnicef for possible sinusitis. She notes she had fevers and chills with this as well. Cough was productive of clear sputum until last night when she coughed up grayish sputum on one episode. Notes she has not coughed anything up since then. No blood in sputum. Notes she had mild shortness of breath after walking up several flights of stairs during the first couple of days of illness. No issues with shortness of breath in the past several days. No chest pain with this. No shortness of breath at this time. Notes her cough has improved, is not productive at this time. Sinus congestion has improved. No fevers in several days. Feels much improved overall. Most bothersome thing is her cough.   PMH: nonsmoker. History of bronchitis in past.    ROS see HPI  Objective  Physical Exam Filed Vitals:   03/12/15 1406  BP: 116/74  Pulse: 93  Temp: 98.3 F (36.8 C)  Ambulatory O2 sat 95%  Physical Exam  Constitutional: She is well-developed, well-nourished, and in no distress.  Well appearing, dry cough during exam  HENT:  Head: Normocephalic and atraumatic.  Right Ear: External ear normal.  Left Ear: External ear normal.  Mouth/Throat: Oropharynx is clear and moist. No oropharyngeal exudate.  Normal TM bilaterally  Eyes: Conjunctivae are normal. Pupils are equal, round, and reactive to light.  Neck: Neck supple.  Cardiovascular: Normal rate and normal heart sounds.   Pulmonary/Chest: Effort normal. No respiratory distress. She has no wheezes.  Right lower lobe with few scattered crackles, no wheezes  Musculoskeletal: She exhibits no edema.    Lymphadenopathy:    She has no cervical adenopathy.  Neurological: She is alert.  Skin: Skin is warm and dry. She is not diaphoretic.     Assessment/Plan: Please see individual problem list.  Cough Patient with improving symptoms at this time. Mild right lower lobe crackles possibly indicating atelectasis vs resolving PNA. Suspect patient may have had PNA given symptoms, though could also have been sinusitis. At this time she appears well. Symptoms over all improving. Her vitals are stable. Had ambulatory O2 sat of 95%. No shortness of breath in several days. Discussed CXR with patient given crackles, though she declined given improvement. Will continue omnicef. Prescription given for cough suppressant. Given return precautions.     Meds ordered this encounter  Medications  . cefdinir (OMNICEF) 300 MG capsule    Sig: Take by mouth.  . DISCONTD: guaiFENesin-codeine (ROBITUSSIN AC) 100-10 MG/5ML syrup    Sig: Take by mouth.  Marland Kitchen guaiFENesin-codeine (ROBITUSSIN AC) 100-10 MG/5ML syrup    Sig: Take 5 mLs by mouth 4 (four) times daily as needed for cough.    Dispense:  120 mL    Refill:  0    Marikay Alar

## 2015-03-12 NOTE — Patient Instructions (Signed)
Nice to meet you. I'm glad you are doing better. Please finish the course of antibiotics. You can use the cough suppresant for cough.  If you develop worsening cough, or fever, shortness of breath, chest pain, chills, sweats, or return of symptoms please seek medical attention.

## 2015-03-12 NOTE — Assessment & Plan Note (Signed)
Patient with improving symptoms at this time. Mild right lower lobe crackles possibly indicating atelectasis vs resolving PNA. Suspect patient may have had PNA given symptoms, though could also have been sinusitis. At this time she appears well. Symptoms over all improving. Her vitals are stable. Had ambulatory O2 sat of 95%. No shortness of breath in several days. Discussed CXR with patient given crackles, though she declined given improvement. Will continue omnicef. Prescription given for cough suppressant. Given return precautions.

## 2015-03-12 NOTE — Progress Notes (Signed)
Pre visit review using our clinic review tool, if applicable. No additional management support is needed unless otherwise documented below in the visit note. 

## 2015-04-01 ENCOUNTER — Other Ambulatory Visit: Payer: Self-pay | Admitting: Internal Medicine

## 2015-06-09 ENCOUNTER — Ambulatory Visit (INDEPENDENT_AMBULATORY_CARE_PROVIDER_SITE_OTHER): Payer: BC Managed Care – PPO | Admitting: Internal Medicine

## 2015-06-09 ENCOUNTER — Encounter: Payer: Self-pay | Admitting: Internal Medicine

## 2015-06-09 VITALS — BP 95/65 | HR 79 | Temp 98.7°F | Ht 64.0 in | Wt 213.5 lb

## 2015-06-09 DIAGNOSIS — Z23 Encounter for immunization: Secondary | ICD-10-CM

## 2015-06-09 DIAGNOSIS — F32A Depression, unspecified: Secondary | ICD-10-CM

## 2015-06-09 DIAGNOSIS — E669 Obesity, unspecified: Secondary | ICD-10-CM | POA: Diagnosis not present

## 2015-06-09 DIAGNOSIS — G43009 Migraine without aura, not intractable, without status migrainosus: Secondary | ICD-10-CM

## 2015-06-09 DIAGNOSIS — F329 Major depressive disorder, single episode, unspecified: Secondary | ICD-10-CM

## 2015-06-09 MED ORDER — PROPRANOLOL HCL 10 MG PO TABS: 10.0000 mg | ORAL_TABLET | Freq: Every day | ORAL | Status: DC

## 2015-06-09 MED ORDER — ESCITALOPRAM OXALATE 20 MG PO TABS: 20.0000 mg | ORAL_TABLET | Freq: Every day | ORAL | Status: DC

## 2015-06-09 NOTE — Assessment & Plan Note (Signed)
Symptoms well controlled on Lexapro. Discussed potential implications of taking SSRI during pregnancy. She will discuss with OB.

## 2015-06-09 NOTE — Assessment & Plan Note (Signed)
No recent migraines. Will try stopping Propranolol. Follow up prn.

## 2015-06-09 NOTE — Patient Instructions (Addendum)
It would fine to give a trial off Propranol. Check blood pressure 1-2 times weekly after stopping. Call or email if any concerns.    Follow up in 6 months or sooner as needed.

## 2015-06-09 NOTE — Progress Notes (Signed)
Subjective:    Patient ID: Sue Hernandez, female    DOB: 03/06/1981, 34 y.o.   MRN: [id number]  HPI  34YO female presents for follow up.  Got married last week. No recent migraines. Last about 2 months ago. Considering stopping Propranolol. Plans to meet with OB. Planning to try to get pregnant at some point soon.  Concerned about using Lexapro during pregnancy and breastfeeding.  Wt Readings from Last 3 Encounters:  06/09/15 213 lb 8 oz (96.843 kg)  03/12/15 204 lb 12.8 oz (92.897 kg)  12/09/14 202 lb 12.8 oz (91.989 kg)   BP Readings from Last 3 Encounters:  06/09/15 95/65  03/12/15 116/74  12/09/14 108/60    Past Medical History  Diagnosis Date  . Eating disorder     bulemia  . Migraines   . Depression     Dr. Ardelle AntonWagoner   Family History  Problem Relation Age of Onset  . Cancer Paternal Grandmother     Breast  . Heart disease Paternal Grandfather   . Cancer Paternal Grandfather     renal cancer went to his lungs  . Cancer Maternal Grandmother     lung  . Cancer Maternal Grandfather     lung   Past Surgical History  Procedure Laterality Date  . Cholecystectomy  2009  . Laparoscopic gastric banding  2009    Dr. Terrill MohrEanic  . Tonsillectomy  2006  . Upper gi endoscopy  2014    hiatial hernia, Dr. Smitty CordsBruce  . 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 Marital Status: Single    Spouse Name: N/A  . Number of Children: N/A  . Years of Education: N/A   Social History Main Topics  . Smoking status: Never Smoker   . Smokeless tobacco: Never Used  . Alcohol Use: Yes     Comment: Maybe once a month  . Drug Use: No  . Sexual Activity: Not Asked   Other Topics Concern  . None   Social History Narrative   [place]      Work - Runner, broadcasting/film/videoTeacher for [employer]      Diet - regular, followed bariatric clinic    Review of Systems  Constitutional: Negative for fever, chills, appetite change,  fatigue and unexpected weight change.  Eyes: Negative for visual disturbance.  Respiratory: Negative for shortness of breath.   Cardiovascular: Negative for chest pain and leg swelling.  Gastrointestinal: Negative for nausea, vomiting, abdominal pain, diarrhea and constipation.  Musculoskeletal: Negative for myalgias and arthralgias.  Skin: Negative for color change and rash.  Hematological: Negative for adenopathy. Does not bruise/bleed easily.  Psychiatric/Behavioral: Negative for sleep disturbance and dysphoric mood. The patient is not nervous/anxious.        Objective:    BP 95/65 mmHg  Pulse 79  Temp(Src) 98.7 F (37.1 C) (Oral)  Ht 5\' 4"  (1.626 m)  Wt 213 lb 8 oz (96.843 kg)  BMI 36.63 kg/m2  SpO2 98%  LMP 05/18/2015 (Approximate) Physical Exam  Constitutional: She is oriented to person, place, and time. She appears well-developed and well-nourished. No distress.  HENT:  Head: Normocephalic and atraumatic.  Right Ear: External ear normal.  Left Ear: External ear normal.  Nose: Nose normal.  Mouth/Throat: Oropharynx is clear and moist. No oropharyngeal exudate.  Eyes: Conjunctivae are normal. Pupils are equal, round, and reactive to light. Right eye exhibits no discharge. Left eye exhibits no discharge. No scleral  icterus.  Neck: Normal range of motion. Neck supple. No tracheal deviation present. No thyromegaly present.  Cardiovascular: Normal rate, regular rhythm, normal heart sounds and intact distal pulses.  Exam reveals no gallop and no friction rub.   No murmur heard. Pulmonary/Chest: Effort normal and breath sounds normal. No respiratory distress. She has no wheezes. She has no rales. She exhibits no tenderness.  Musculoskeletal: Normal range of motion. She exhibits no edema or tenderness.  Lymphadenopathy:    She has no cervical adenopathy.  Neurological: She is alert and oriented to person, place, and time. No cranial nerve deficit. She exhibits normal muscle tone.  Coordination normal.  Skin: Skin is warm and dry. No rash noted. She is not diaphoretic. No erythema. No pallor.  Psychiatric: She has a normal mood and affect. Her behavior is normal. Judgment and thought content normal.          Assessment & Plan:   Problem List Items Addressed This Visit      Unprioritized   Depression - Primary    Symptoms well controlled on Lexapro. Discussed potential implications of taking SSRI during pregnancy. She will discuss with OB.      Relevant Medications   escitalopram (LEXAPRO) 20 MG tablet   Migraine    No recent migraines. Will try stopping Propranolol. Follow up prn.      Relevant Medications   escitalopram (LEXAPRO) 20 MG tablet   propranolol (INDERAL) 10 MG tablet   Obesity (BMI 30-39.9)    Wt Readings from Last 3 Encounters:  06/09/15 213 lb 8 oz (96.843 kg)  03/12/15 204 lb 12.8 oz (92.897 kg)  12/09/14 202 lb 12.8 oz (91.989 kg)   Encouraged healthy diet and exercise.           Return in about 6 months (around 12/07/2015) for Recheck.

## 2015-06-09 NOTE — Progress Notes (Signed)
Pre visit review using our clinic review tool, if applicable. No additional management support is needed unless otherwise documented below in the visit note. 

## 2015-06-09 NOTE — Assessment & Plan Note (Signed)
Wt Readings from Last 3 Encounters:  06/09/15 213 lb 8 oz (96.843 kg)  03/12/15 204 lb 12.8 oz (92.897 kg)  12/09/14 202 lb 12.8 oz (91.989 kg)   Encouraged healthy diet and exercise.

## 2015-06-21 ENCOUNTER — Telehealth: Payer: Self-pay | Admitting: *Deleted

## 2015-06-21 NOTE — Telephone Encounter (Signed)
Patient dropped off lab result sheet from [facility], sheet is in  Dr. Sonny DandyWalkers mailbox

## 2015-07-20 ENCOUNTER — Encounter: Payer: Self-pay | Admitting: Family Medicine

## 2015-07-20 ENCOUNTER — Ambulatory Visit (INDEPENDENT_AMBULATORY_CARE_PROVIDER_SITE_OTHER): Payer: BC Managed Care – PPO | Admitting: Family Medicine

## 2015-07-20 VITALS — BP 122/70 | HR 90 | Temp 98.5°F | Ht 64.0 in | Wt 215.5 lb

## 2015-07-20 DIAGNOSIS — J0110 Acute frontal sinusitis, unspecified: Secondary | ICD-10-CM | POA: Insufficient documentation

## 2015-07-20 DIAGNOSIS — J0100 Acute maxillary sinusitis, unspecified: Secondary | ICD-10-CM | POA: Diagnosis not present

## 2015-07-20 DIAGNOSIS — J019 Acute sinusitis, unspecified: Secondary | ICD-10-CM | POA: Insufficient documentation

## 2015-07-20 MED ORDER — AMOXICILLIN-POT CLAVULANATE 875-125 MG PO TABS: 1.0000 | ORAL_TABLET | Freq: Two times a day (BID) | ORAL | Status: DC

## 2015-07-20 MED ORDER — FLUCONAZOLE 150 MG PO TABS: 150.0000 mg | ORAL_TABLET | Freq: Once | ORAL | Status: DC

## 2015-07-20 NOTE — Assessment & Plan Note (Signed)
New problem. Patient with signs and symptoms consistent with acute maxillary sinusitis. Treating with Augmentin. Diflucan given as patient is prone to yeast infections antibiotic therapy.

## 2015-07-20 NOTE — Progress Notes (Signed)
Subjective:  Patient ID: Sue Hernandez, female    DOB: 01/13/1981  Age: 34 y.o. MRN: [id number]  CC: Sinus pain/pressure  HPI:  34 year old female presents to clinic today for an acute visit with the above complaints.  Sinus pain/pressure  Patient states that she has had severe sinus pain and pressure for the past 5 days.  She reports that she's blowing discolored mucus out of her nose. She states that it is yellow.  She been taking Tylenol Sinus and over-the-counter Zyrtec with no improvement.  No known exacerbating factors.  No associated fevers or chills.  No other associated symptoms.  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  Social Hx   Social History   Social History  . Marital Status: Single    Spouse Name: N/A  . Number of Children: N/A  . Years of Education: N/A   Social History Main Topics  . Smoking status: Never Smoker   . Smokeless tobacco: Never Used  . Alcohol Use: Yes     Comment: Maybe once a month  . Drug Use: No  . Sexual Activity: Not Asked   Other Topics Concern  . 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  HENT: Positive for congestion and sinus pressure.    Objective:  BP 122/70 mmHg  Pulse 90  Temp(Src) 98.5 F (36.9 C) (Oral)  Ht 5\' 4"  (1.626 m)  Wt 215 lb 8 oz (97.75 kg)  BMI 36.97 kg/m2  SpO2 97%  BP/Weight 07/20/2015 06/09/2015 03/12/2015  Systolic BP 122 95 116  Diastolic BP 70 65 74  Wt. (Lbs) 215.5 213.5 204.8  BMI 36.97 36.63 36.29   Physical Exam  Constitutional: She is oriented to person, place, and time. She appears well-developed. No distress.  HENT:  Head: Normocephalic and atraumatic.  Mild oropharyngeal erythema. Severe right maxillary sinus tenderness. Swollen nasal turbinates.    Eyes: Conjunctivae are normal.  Neck: Neck supple.  Cardiovascular: Normal rate and regular rhythm.   No murmur heard. Pulmonary/Chest: Effort normal and breath sounds normal. No respiratory distress. She has no wheezes. She has no rales.  Lymphadenopathy:    She has no cervical adenopathy.  Neurological: She is alert and oriented to person, place, and time.  Psychiatric: She has a normal mood and affect.  Vitals reviewed.  Lab Results  Component Value Date   WBC 5.7 09/22/2014   HGB 13.5 09/22/2014   HCT 40.7 09/22/2014   PLT 224.0 09/22/2014   GLUCOSE 123* 09/22/2014   CHOL 176 09/22/2014   TRIG 197.0* 09/22/2014   HDL 51.10 09/22/2014   LDLCALC 86 09/22/2014   ALT 13 09/22/2014   AST 12 09/22/2014   NA 140 09/22/2014   K 3.9 09/22/2014   CL 108 09/22/2014   CREATININE 0.83 09/22/2014   BUN 9 09/22/2014   CO2 25 09/22/2014   TSH 1.90 09/22/2014   INR 0.8 07/26/2012   HGBA1C 5.6 09/22/2014   Assessment & Plan:   Problem List Items Addressed This Visit    Acute sinusitis - Primary    New problem. Patient with signs and symptoms consistent with acute maxillary sinusitis. Treating with Augmentin. Diflucan given as patient is prone to yeast infections antibiotic therapy.  Relevant Medications   amoxicillin-clavulanate (AUGMENTIN) 875-125 MG tablet   fluconazole (DIFLUCAN) 150 MG tablet      Meds ordered this encounter  Medications  . amoxicillin-clavulanate (AUGMENTIN) 875-125 MG tablet    Sig: Take 1 tablet by mouth 2 (two) times daily.    Dispense:  20 tablet    Refill:  0  . fluconazole (DIFLUCAN) 150 MG tablet    Sig: Take 1 tablet (150 mg total) by mouth once. Repeat x 1 in 72 hours.    Dispense:  2 tablet    Refill:  2    Follow-up: Return if symptoms worsen or fail to improve.  Everlene Other DO [facility]

## 2015-07-20 NOTE — Progress Notes (Signed)
Pre visit review using our clinic review tool, if applicable. No additional management support is needed unless otherwise documented below in the visit note. 

## 2015-07-20 NOTE — Patient Instructions (Addendum)
It was nice to see you today.  Take the antibiotic (augmentin) as prescribed.  Call if you worsen or fail to improve.  Follow up:  Return if symptoms worsen or fail to improve.  Take care & have a happy new year  Dr. Adriana Simasook

## 2015-09-30 ENCOUNTER — Encounter: Payer: Self-pay | Admitting: Internal Medicine

## 2015-11-29 ENCOUNTER — Encounter: Payer: Self-pay | Admitting: Internal Medicine

## 2016-01-17 ENCOUNTER — Other Ambulatory Visit: Payer: Self-pay | Admitting: Internal Medicine

## 2016-01-28 ENCOUNTER — Encounter: Payer: Self-pay | Admitting: Internal Medicine

## 2016-01-28 ENCOUNTER — Ambulatory Visit (INDEPENDENT_AMBULATORY_CARE_PROVIDER_SITE_OTHER): Payer: BC Managed Care – PPO | Admitting: Internal Medicine

## 2016-01-28 VITALS — BP 104/62 | HR 85 | Ht 63.0 in | Wt 217.4 lb

## 2016-01-28 DIAGNOSIS — F329 Major depressive disorder, single episode, unspecified: Secondary | ICD-10-CM | POA: Diagnosis not present

## 2016-01-28 DIAGNOSIS — B001 Herpesviral vesicular dermatitis: Secondary | ICD-10-CM | POA: Diagnosis not present

## 2016-01-28 DIAGNOSIS — F32A Depression, unspecified: Secondary | ICD-10-CM

## 2016-01-28 MED ORDER — VALACYCLOVIR HCL 1 G PO TABS: 1000.0000 mg | ORAL_TABLET | Freq: Two times a day (BID) | ORAL | Status: DC

## 2016-01-28 MED ORDER — PRENATAL VITAMIN PLUS LOW IRON 27-1 MG PO TABS: 1.0000 | ORAL_TABLET | Freq: Every day | ORAL | Status: DC

## 2016-01-28 NOTE — Progress Notes (Signed)
Subjective:    Patient ID: Sue Hernandez, female    DOB: 11/22/1980, 35 y.o.   MRN: [id number]  HPI  35YO female presents for follow up.  Feeling well. Trying to get pregnant. Working with [employer] at [employer]. On prenatal vitamin. No concerns today. No recent symptoms of depression.   Wt Readings from Last 3 Encounters:  01/28/16 217 lb 6.4 oz (98.612 kg)  07/20/15 215 lb 8 oz (97.75 kg)  06/09/15 213 lb 8 oz (96.843 kg)   BP Readings from Last 3 Encounters:  01/28/16 104/62  07/20/15 122/70  06/09/15 95/65    Past Medical History  Diagnosis Date  . Eating disorder     bulemia  . Migraines   . Depression     Dr. Ardelle AntonWagoner   Family History  Problem Relation Age of Onset  . Cancer Paternal Grandmother     Breast  . Heart disease Paternal Grandfather   . Cancer Paternal Grandfather     renal cancer went to his lungs  . Cancer Maternal Grandmother     lung  . Cancer Maternal Grandfather     lung   Past Surgical History  Procedure Laterality Date  . Cholecystectomy  2009  . Laparoscopic gastric banding  2009    Dr. Terrill MohrEanic  . Tonsillectomy  2006  . Upper gi endoscopy  2014    hiatial hernia, Dr. Smitty CordsBruce  . 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 Marital Status: Single    Spouse Name: N/A  . Number of Children: N/A  . Years of Education: N/A   Social History Main Topics  . Smoking status: Never Smoker   . Smokeless tobacco: Never Used  . Alcohol Use: Yes     Comment: Maybe once a month  . Drug Use: No  . Sexual Activity: Not Asked   Other Topics Concern  . None   Social History Narrative   [place]      Work - Runner, broadcasting/film/videoTeacher for [employer]      Diet - regular, followed bariatric clinic    Review of Systems  Constitutional: Negative for fever, chills, appetite change, fatigue and unexpected weight change.  Eyes: Negative for visual disturbance.  Respiratory: Negative for  shortness of breath.   Cardiovascular: Negative for chest pain and leg swelling.  Gastrointestinal: Negative for nausea, vomiting, abdominal pain, diarrhea and constipation.  Skin: Negative for color change and rash.  Hematological: Negative for adenopathy. Does not bruise/bleed easily.  Psychiatric/Behavioral: Negative for suicidal ideas, sleep disturbance and dysphoric mood. The patient is not nervous/anxious.        Objective:    BP 104/62 mmHg  Pulse 85  Ht 5\' 3"  (1.6 m)  Wt 217 lb 6.4 oz (98.612 kg)  BMI 38.52 kg/m2  SpO2 99%  LMP 01/06/2016 (Approximate) Physical Exam  Constitutional: She is oriented to person, place, and time. She appears well-developed and well-nourished. No distress.  HENT:  Head: Normocephalic and atraumatic.  Right Ear: External ear normal.  Left Ear: External ear normal.  Nose: Nose normal.  Mouth/Throat: Oropharynx is clear and moist. No oropharyngeal exudate.  Eyes: Conjunctivae are normal. Pupils are equal, round, and reactive to light. Right eye exhibits no discharge. Left eye exhibits no discharge. No scleral icterus.  Neck: Normal range of motion. Neck supple. No tracheal deviation present. No thyromegaly present.  Cardiovascular: Normal rate, regular rhythm, normal heart sounds and intact distal  pulses.  Exam reveals no gallop and no friction rub.   No murmur heard. Pulmonary/Chest: Effort normal and breath sounds normal. No respiratory distress. She has no wheezes. She has no rales. She exhibits no tenderness.  Musculoskeletal: Normal range of motion. She exhibits no edema or tenderness.  Lymphadenopathy:    She has no cervical adenopathy.  Neurological: She is alert and oriented to person, place, and time. No cranial nerve deficit. She exhibits normal muscle tone. Coordination normal.  Skin: Skin is warm and dry. No rash noted. She is not diaphoretic. No erythema. No pallor.  Psychiatric: She has a normal mood and affect. Her behavior is  normal. Judgment and thought content normal.          Assessment & Plan:   Problem List Items Addressed This Visit      Unprioritized   Depression - Primary    Symptoms stable on Lexapro. Will plan to continue medication. Follow up with new provider in 6 months and prn.       Other Visit Diagnoses    Cold sore        Relevant Medications    valACYclovir (VALTREX) 1000 MG tablet        Return in about 6 months (around 07/30/2016) for New Patient.  Ronna PolioJennifer Anysia Choi, MD Internal Medicine [facility]

## 2016-01-28 NOTE — Assessment & Plan Note (Signed)
Symptoms stable on Lexapro. Will plan to continue medication. Follow up with new provider in 6 months and prn.

## 2016-01-28 NOTE — Progress Notes (Signed)
Pre visit review using our clinic review tool, if applicable. No additional management support is needed unless otherwise documented below in the visit note. 

## 2016-02-11 ENCOUNTER — Ambulatory Visit: Payer: BC Managed Care – PPO | Admitting: Family Medicine

## 2016-03-15 LAB — OB RESULTS CONSOLE VARICELLA ZOSTER ANTIBODY, IGG: Varicella: IMMUNE

## 2016-03-15 LAB — OB RESULTS CONSOLE HIV ANTIBODY (ROUTINE TESTING): HIV: NONREACTIVE

## 2016-03-15 LAB — OB RESULTS CONSOLE HEPATITIS B SURFACE ANTIGEN: Hepatitis B Surface Ag: NEGATIVE

## 2016-03-15 LAB — OB RESULTS CONSOLE RUBELLA ANTIBODY, IGM: Rubella: IMMUNE

## 2016-07-28 ENCOUNTER — Telehealth: Payer: Self-pay | Admitting: Family

## 2016-07-28 NOTE — Telephone Encounter (Signed)
Pt continued care with Rennie PlowmanMargaret Arnett does not need appt at this time. Thank you!

## 2016-09-08 LAB — OB RESULTS CONSOLE RPR: RPR: NONREACTIVE

## 2016-09-11 ENCOUNTER — Other Ambulatory Visit: Payer: Self-pay | Admitting: Obstetrics and Gynecology

## 2016-09-11 DIAGNOSIS — Z3689 Encounter for other specified antenatal screening: Secondary | ICD-10-CM

## 2016-09-14 LAB — OB RESULTS CONSOLE GC/CHLAMYDIA
Chlamydia: NEGATIVE
Gonorrhea: NEGATIVE

## 2016-09-14 LAB — OB RESULTS CONSOLE GBS: GBS: NEGATIVE

## 2016-09-28 ENCOUNTER — Other Ambulatory Visit: Payer: BC Managed Care – PPO

## 2016-09-28 ENCOUNTER — Ambulatory Visit: Payer: BC Managed Care – PPO

## 2016-09-28 ENCOUNTER — Ambulatory Visit
Admission: RE | Admit: 2016-09-28 | Discharge: 2016-09-28 | Disposition: A | Discharge status: Home / Self Care | Payer: BC Managed Care – PPO | Source: Ambulatory Visit | Attending: Obstetrics & Gynecology | Admitting: Obstetrics & Gynecology

## 2016-09-28 ENCOUNTER — Ambulatory Visit (HOSPITAL_BASED_OUTPATIENT_CLINIC_OR_DEPARTMENT_OTHER)
Admission: RE | Admit: 2016-09-28 | Discharge: 2016-09-28 | Disposition: A | Discharge status: Home / Self Care | Payer: BC Managed Care – PPO | Source: Ambulatory Visit | Attending: Obstetrics & Gynecology | Admitting: Obstetrics & Gynecology

## 2016-09-28 DIAGNOSIS — Z3689 Encounter for other specified antenatal screening: Secondary | ICD-10-CM

## 2016-09-28 DIAGNOSIS — Z3A38 38 weeks gestation of pregnancy: Secondary | ICD-10-CM | POA: Insufficient documentation

## 2016-09-28 DIAGNOSIS — O403XX0 Polyhydramnios, third trimester, not applicable or unspecified: Secondary | ICD-10-CM | POA: Insufficient documentation

## 2016-09-28 DIAGNOSIS — O409XX0 Polyhydramnios, unspecified trimester, not applicable or unspecified: Secondary | ICD-10-CM

## 2016-09-28 NOTE — Progress Notes (Signed)
[facility] See US report from today.  Berry Godsey, [place] A, MD

## 2016-10-06 ENCOUNTER — Inpatient Hospital Stay
Admission: EM | Admit: 2016-10-06 | Discharge: 2016-10-11 | DRG: 765 | Disposition: A | Discharge status: Home / Self Care | Payer: BC Managed Care – PPO | Attending: Obstetrics and Gynecology | Admitting: Obstetrics and Gynecology

## 2016-10-06 DIAGNOSIS — B001 Herpesviral vesicular dermatitis: Secondary | ICD-10-CM | POA: Diagnosis present

## 2016-10-06 DIAGNOSIS — E669 Obesity, unspecified: Secondary | ICD-10-CM | POA: Diagnosis present

## 2016-10-06 DIAGNOSIS — O99344 Other mental disorders complicating childbirth: Secondary | ICD-10-CM | POA: Diagnosis present

## 2016-10-06 DIAGNOSIS — Z3493 Encounter for supervision of normal pregnancy, unspecified, third trimester: Secondary | ICD-10-CM | POA: Diagnosis present

## 2016-10-06 DIAGNOSIS — Z6841 Body Mass Index (BMI) 40.0 and over, adult: Secondary | ICD-10-CM | POA: Diagnosis not present

## 2016-10-06 DIAGNOSIS — Z3A39 39 weeks gestation of pregnancy: Secondary | ICD-10-CM | POA: Diagnosis not present

## 2016-10-06 DIAGNOSIS — O9852 Other viral diseases complicating childbirth: Secondary | ICD-10-CM | POA: Diagnosis present

## 2016-10-06 DIAGNOSIS — O99214 Obesity complicating childbirth: Secondary | ICD-10-CM | POA: Diagnosis present

## 2016-10-06 DIAGNOSIS — K909 Intestinal malabsorption, unspecified: Secondary | ICD-10-CM | POA: Diagnosis present

## 2016-10-06 DIAGNOSIS — O409XX0 Polyhydramnios, unspecified trimester, not applicable or unspecified: Secondary | ICD-10-CM

## 2016-10-06 DIAGNOSIS — O403XX0 Polyhydramnios, third trimester, not applicable or unspecified: Secondary | ICD-10-CM | POA: Diagnosis present

## 2016-10-06 DIAGNOSIS — Z8249 Family history of ischemic heart disease and other diseases of the circulatory system: Secondary | ICD-10-CM | POA: Diagnosis not present

## 2016-10-06 DIAGNOSIS — Z98891 History of uterine scar from previous surgery: Secondary | ICD-10-CM

## 2016-10-06 DIAGNOSIS — O329XX0 Maternal care for malpresentation of fetus, unspecified, not applicable or unspecified: Secondary | ICD-10-CM | POA: Diagnosis present

## 2016-10-06 DIAGNOSIS — F329 Major depressive disorder, single episode, unspecified: Secondary | ICD-10-CM | POA: Diagnosis present

## 2016-10-06 DIAGNOSIS — L259 Unspecified contact dermatitis, unspecified cause: Secondary | ICD-10-CM | POA: Diagnosis present

## 2016-10-06 LAB — CBC
HCT: 36.5 % (ref 35.0–47.0)
Hemoglobin: 12.2 g/dL (ref 12.0–16.0)
MCH: 26.5 pg (ref 26.0–34.0)
MCHC: 33.5 g/dL (ref 32.0–36.0)
MCV: 79.3 fL — ABNORMAL LOW (ref 80.0–100.0)
Platelets: 305 10*3/uL (ref 150–440)
RBC: 4.6 MIL/uL (ref 3.80–5.20)
RDW: 15.4 % — ABNORMAL HIGH (ref 11.5–14.5)
WBC: 16.3 10*3/uL — ABNORMAL HIGH (ref 3.6–11.0)

## 2016-10-06 LAB — RAPID HIV SCREEN (HIV 1/2 AB+AG)
HIV 1/2 Antibodies: NONREACTIVE
HIV-1 P24 Antigen - HIV24: NONREACTIVE

## 2016-10-06 LAB — TYPE AND SCREEN
ABO/RH(D): A POS
Antibody Screen: NEGATIVE

## 2016-10-06 MED ORDER — ONDANSETRON HCL 4 MG/2ML IJ SOLN: 4.0000 mg | Freq: Four times a day (QID) | INTRAMUSCULAR | Status: DC | PRN

## 2016-10-06 MED ORDER — OXYTOCIN 40 UNITS IN LACTATED RINGERS INFUSION - SIMPLE MED: 2.5000 [IU]/h | INTRAVENOUS | Status: DC

## 2016-10-06 MED ORDER — ACETAMINOPHEN 325 MG PO TABS: 650.0000 mg | ORAL_TABLET | ORAL | Status: DC | PRN

## 2016-10-06 MED ORDER — LIDOCAINE HCL (PF) 1 % IJ SOLN: 30.0000 mL | INTRAMUSCULAR | Status: DC | PRN

## 2016-10-06 MED ORDER — OXYTOCIN BOLUS FROM INFUSION: 500.0000 mL | Freq: Once | INTRAVENOUS | Status: DC

## 2016-10-06 MED ORDER — SOD CITRATE-CITRIC ACID 500-334 MG/5ML PO SOLN
30.0000 mL | ORAL | Status: DC | PRN
  Administered 2016-10-07 (×3): 30 mL via ORAL
  Filled 2016-10-06 (×3): qty 15

## 2016-10-06 MED ORDER — LACTATED RINGERS IV SOLN
500.0000 mL | INTRAVENOUS | Status: DC | PRN
  Administered 2016-10-07: 500 mL via INTRAVENOUS

## 2016-10-06 MED ORDER — LACTATED RINGERS IV SOLN
INTRAVENOUS | Status: DC
  Administered 2016-10-06 – 2016-10-07 (×2): via INTRAVENOUS

## 2016-10-06 NOTE — H&P (Signed)
Sue Hernandez is a 36 y.o. female G1P0 with LMP of 01/05/16 & EDD 10/11/16 presenting for  Labor contractions since [facility] significant for polyhydramnios, depression, hx of gastric sleeve, LGSIL, Type 1 HSV (oral lesions only), Bulimia, migraines, malabsorption syndrome, Obesity. Pt has had no VB, decreased FM, no LOF, +UC's becoming more uncomfortable.  OB History    Gravida Para Term Preterm AB Living   1         0   SAB TAB Ectopic Multiple Live Births                 Past Medical History:  Diagnosis Date  . Depression    Dr. Ardelle AntonWagoner  . Eating disorder    bulemia  . Migraines    Past Surgical History:  Procedure Laterality Date  . CHOLECYSTECTOMY  2009  . LAPAROSCOPIC GASTRIC BANDING  2009   Dr. Terrill MohrEanic  . LAPAROSCOPIC GASTRIC SLEEVE RESECTION  01/2013  . TONSILLECTOMY  2006  . UPPER GI ENDOSCOPY  2014   hiatial hernia, Dr. Smitty CordsBruce   Family History: family history includes Cancer in her maternal grandfather, maternal grandmother, paternal grandfather, and paternal grandmother; Heart disease in her paternal grandfather. Social History:  reports that she has never smoked. She has never used smokeless tobacco. She reports that she does not drink alcohol or use drugs. Allergy: Ibuprofen, Morphine    Maternal Diabetes: 128 Genetic Screening: see AP chart Maternal Ultrasounds/Referrals: normal anatomy Fetal Ultrasounds or other Referrals:   Maternal Substance Abuse: neg Significant Maternal Medications: PNV Significant Maternal Lab Results:   Other Comments:    Review of Systems  Constitutional: Negative.   HENT: Negative.   Eyes: Negative.   Respiratory: Negative.   Cardiovascular: Negative.   Gastrointestinal: Negative.   Genitourinary: Negative.   Musculoskeletal: Negative.   Skin: Negative.   Neurological: Negative.   Endo/Heme/Allergies: Negative.   Psychiatric/Behavioral: Negative.   GYN:+UC's History Dilation: 10 Effacement (%):  100 Station: -1 Exam by:: Yetta Barre. Jones, CNM Last menstrual period 01/05/2016. Exam Physical Exam  HEENT: Eyes non-icteric. Heart: S1S2, RRR, no M/R/G. Lungs: CTA bilat Extrems: 2+/0 cx:C/C-2 BBOW, Vtx Prenatal labs: ABO, Rh:    A pos Antibody:   Neg Rubella:  Immune RPR:   NR HBsAg:   Neg  HIV:   Neg GBS:   Neg  Assessment/Plan: A: 1. IUP at 39 2/7 weeks 2. Morbid Obesity 3. Active Labor pattern P: 1. Admit for delivery 2. GBS neg: no antibiotics needed 3. Plans birth plan: no Pitocin per pt.   Sharee PimpleCaron W Jones 10/06/2016, 9:17 PM

## 2016-10-06 NOTE — Progress Notes (Signed)
Patient ID: [name], female   DOB: 01/17/1981, 36 y.o.   MRN: [id number] 2250 AROM for clear fluid mod amt. FHR:150 after AROM. Working well with UC's. VSS.

## 2016-10-06 NOTE — Discharge Summary (Signed)
Obstetrical Discharge Summary  Patient Name: Sue Hernandez DOB: 29-Nov-1980 MRN: [id number]  Date of Admission: 10/06/2016 Date of Discharge: 10/11/2016  Primary OB: [facility]   Gestational Age at Delivery: [redacted]w[redacted]d   Antepartum complications: polyhydramnios, depression, hx of gastric sleeve, LGSIL, Type 1 HSV (oral lesions only), Bulimia, migraines, malabsorption syndrome, Obesity Admitting Diagnosis: active labor Secondary Diagnosis:  Patient Active Problem List   Diagnosis Date Noted  . Indication for care in labor and delivery, antepartum 10/06/2016  . Indication for care in labor or delivery 10/06/2016  . Polyhydramnios affecting pregnancy 09/28/2016  . Obesity (BMI 30-39.9) 08/30/2012  . Depression 08/30/2012  . Migraine 08/30/2012    Augmentation: AROM Complications: arrest of dilation, Cat II strip Intrapartum complications/course: 35yo G1P0 now at 39+3wks with arrest of dilation/descent for many hours. She presented with a fully dilated cervix and bulging bag, but even with AROM no progression was made. At her request, and while the FHT remained reassuring and maternal status not acute, we continued to trial of labor. However, she did not make change in 16 hours, and we proceeded cesarean section. Date of Delivery: 10/07/16 Delivered By: Elenora Fender Ward, MD Delivery Type: primary cesarean section, low transverse incision Anesthesia: epidural,  Local and nitrous Placenta: Spontaneous  Newborn Data: Live born female Alex Birth Weight: 8 lb 0.8 oz (3650 g) APGAR: 8, 9    Discharge Physical Exam BP 135/88 (BP Location: Left Arm)   Pulse 80   Temp 98.6 F (37 C) (Oral)   Resp 20   Ht 5' 3.6" (1.615 m)   Wt 111.6 kg (246 lb)   LMP 01/05/2016   SpO2 99%   Breastfeeding? Unknown   BMI 42.76 kg/m   General: NAD CV: RRR Pulm: CTABL, nl effort ABD: s/nd/nt, fundus firm and below the umbilicus Lochia: moderate Incision: c/d/i  DVT Evaluation: LE non-ttp,  no evidence of DVT on exam.  Hemoglobin  Date Value Ref Range Status  10/08/2016 9.1 (L) 12.0 - 16.0 g/dL Final    Comment:    RESULT REPEATED AND VERIFIED   HGB  Date Value Ref Range Status  09/23/2013 12.6 12.0 - 16.0 g/dL Final   HCT  Date Value Ref Range Status  10/08/2016 27.4 (L) 35.0 - 47.0 % Final  09/23/2013 37.5 35.0 - 47.0 % Final    Post partum course: routine, had rash on abdomen and back, likely due to iodine adhesive Postpartum Procedures: none Disposition: stable, discharge to home. Baby Feeding: breastmilk Baby Disposition: SCN  Rh Immune globulin given:n/a Rubella vaccine given: n/a Tdap vaccine given in AP or PP setting: 08/01/16 Flu vaccine given in AP or PP setting: 06/14/16  Contraception: TBD  Prenatal Labs:  MBT A+ Ab screen Neg Pap 07/14/15 Neg HIV Neg Hep B/RPR Neg/NR GC/C Neg Ua culture Neg Rubella Immune VZV Immune   Plan:  Bascom Levels was discharged to home in good condition. Follow-up appointment at [facility] 2 weeks   Discharge Medications: Allergies as of 10/11/2016      Reactions   Ibuprofen Swelling   Swelling of face and hands   Derma Guard [protective Adhesive Powder]    Dermabond - urticaria      Medication List    STOP taking these medications   valACYclovir 1000 MG tablet Commonly known as:  VALTREX     TAKE these medications   acetaminophen 325 MG tablet Commonly known as:  TYLENOL Take 2 tablets (650 mg total) by mouth every  4 (four) hours as needed (for pain scale < 4).   B COMPLEX PO Take by mouth.   B-12 SL Place under the tongue.   BIOTIN PO Take by mouth.   escitalopram 20 MG tablet Commonly known as:  LEXAPRO TAKE 1 TABLET (20 MG TOTAL) BY MOUTH DAILY.   HYDROcodone-acetaminophen 5-325 MG tablet Commonly known as:  NORCO/VICODIN Take 2 tablets by mouth every 4 (four) hours as needed for severe pain.   PRENATAL VITAMIN PLUS LOW IRON 27-1 MG Tabs Take 1 tablet by mouth daily.    STOOL SOFTENER PO Take by mouth.       Follow-up Information    Sue Hernandez, BETHANY, MD Follow up in 2 week(s).   Specialty:  Obstetrics and Gynecology Why:  for incision check, then in 6 weeks for post partum check Contact information: [address] [place] [id number] [phone]           Signed: Elenora FenderChelsea C Ward 11:43 AM 10/11/16

## 2016-10-06 NOTE — Progress Notes (Addendum)
Sue Hernandez is a 36 y.o. G1P0 at 3533w2d by dating with LMP of 01/05/16 & EDD of 10/11/16. Pt started laboring today and was 2/70%/vtx-2 in the office at 1630pm  Subjective: "I want to go naturally and do not want Pitocin"  Objective: Temp 98.1 F (36.7 C) (Axillary)   LMP 01/05/2016  No intake/output data recorded. No intake/output data recorded.  FHT:  130, 1 early decel noted, Cat 1 UC:   q 5 mins, pt bending over during them and laboring naturally SVE:   Dilation: 10 Effacement (%): 100 Station: -1 Exam by:: Sue Hernandez. Sue Hernandez, CNM  Labs: Lab Results  Component Value Date   WBC 16.3 (H) 10/06/2016   HGB 12.2 10/06/2016   HCT 36.5 10/06/2016   MCV 79.3 (L) 10/06/2016   PLT 305 10/06/2016    Assessment / Plan: A: IUP at 39 2/7 weeks 2. Polyhydramnios 3. Morbid Obesity P: Admit for delivery 2. GBs neg 3. Allow pt to have birth plan:she is OOB and standing for now and we will move to intermittant monitoring per the policy.   Labor:Progressing Preeclampsia:  None Fetal Wellbeing:Reassuring Pain Control:  Natural  I/D:  None Anticipated MOD: NSVD, Partner and mom with pt Sue Hernandez 10/06/2016, 9:57 PM

## 2016-10-07 ENCOUNTER — Encounter
Admission: EM | Disposition: A | Discharge status: Home / Self Care | Payer: Self-pay | Source: Home / Self Care | Attending: Obstetrics and Gynecology

## 2016-10-07 ENCOUNTER — Inpatient Hospital Stay: Payer: BC Managed Care – PPO | Admitting: Anesthesiology

## 2016-10-07 SURGERY — Surgical Case
Anesthesia: Epidural

## 2016-10-07 SURGERY — Surgical Case

## 2016-10-07 MED ORDER — TERBUTALINE SULFATE 1 MG/ML IJ SOLN: 0.2500 mg | Freq: Once | INTRAMUSCULAR | Status: DC | PRN

## 2016-10-07 MED ORDER — SODIUM CHLORIDE 0.9 % IV SOLN
INTRAVENOUS | Status: DC | PRN
  Administered 2016-10-07 (×4): 5 mL via EPIDURAL

## 2016-10-07 MED ORDER — FENTANYL CITRATE (PF) 100 MCG/2ML IJ SOLN
INTRAMUSCULAR | Status: DC | PRN
  Administered 2016-10-07: 100 ug via EPIDURAL

## 2016-10-07 MED ORDER — ACETAMINOPHEN 10 MG/ML IV SOLN
INTRAVENOUS | Status: DC | PRN
  Administered 2016-10-07: 1000 mg via INTRAVENOUS

## 2016-10-07 MED ORDER — SIMETHICONE 80 MG PO CHEW: 80.0000 mg | CHEWABLE_TABLET | ORAL | Status: DC | PRN

## 2016-10-07 MED ORDER — PRENATAL MULTIVITAMIN CH
1.0000 | ORAL_TABLET | Freq: Every day | ORAL | Status: DC
  Administered 2016-10-08 – 2016-10-11 (×4): 1 via ORAL
  Filled 2016-10-07 (×4): qty 1

## 2016-10-07 MED ORDER — KETAMINE HCL 10 MG/ML IJ SOLN
INTRAMUSCULAR | Status: DC | PRN
  Administered 2016-10-07 (×2): 25 mg via INTRAVENOUS

## 2016-10-07 MED ORDER — TETANUS-DIPHTH-ACELL PERTUSSIS 5-2.5-18.5 LF-MCG/0.5 IM SUSP: 0.5000 mL | Freq: Once | INTRAMUSCULAR | Status: DC

## 2016-10-07 MED ORDER — SODIUM CHLORIDE 0.9 % IJ SOLN
INTRAMUSCULAR | Status: AC
  Filled 2016-10-07: qty 50

## 2016-10-07 MED ORDER — OXYTOCIN 40 UNITS IN LACTATED RINGERS INFUSION - SIMPLE MED
2.5000 [IU]/h | INTRAVENOUS | Status: AC
  Administered 2016-10-08: 2.5 [IU]/h via INTRAVENOUS
  Filled 2016-10-07: qty 1000

## 2016-10-07 MED ORDER — OXYCODONE-ACETAMINOPHEN 5-325 MG PO TABS
1.0000 | ORAL_TABLET | ORAL | Status: DC | PRN
  Administered 2016-10-08 – 2016-10-09 (×2): 1 via ORAL
  Filled 2016-10-07: qty 1

## 2016-10-07 MED ORDER — BISACODYL 10 MG RE SUPP: 10.0000 mg | Freq: Every day | RECTAL | Status: DC | PRN

## 2016-10-07 MED ORDER — LIDOCAINE HCL 2 % IJ SOLN
INTRAMUSCULAR | Status: DC | PRN
Start: 2016-10-07 — End: 2016-10-07
  Administered 2016-10-07: 7 mL

## 2016-10-07 MED ORDER — OXYTOCIN 40 UNITS IN LACTATED RINGERS INFUSION - SIMPLE MED
1.0000 m[IU]/min | INTRAVENOUS | Status: DC
  Administered 2016-10-07: 1 m[IU]/min via INTRAVENOUS
  Administered 2016-10-07: 750 mL via INTRAVENOUS
  Administered 2016-10-07: 50 mL via INTRAVENOUS
  Filled 2016-10-07: qty 1000

## 2016-10-07 MED ORDER — FENTANYL 2.5 MCG/ML W/ROPIVACAINE 0.2% IN NS 100 ML EPIDURAL INFUSION (ARMC-ANES)
EPIDURAL | Status: AC
  Filled 2016-10-07: qty 100

## 2016-10-07 MED ORDER — SIMETHICONE 80 MG PO CHEW
80.0000 mg | CHEWABLE_TABLET | ORAL | Status: DC
  Administered 2016-10-10: 80 mg via ORAL
  Filled 2016-10-07: qty 1

## 2016-10-07 MED ORDER — BUPIVACAINE HCL (PF) 0.25 % IJ SOLN
INTRAMUSCULAR | Status: DC | PRN
  Administered 2016-10-07: 30 mL

## 2016-10-07 MED ORDER — FLEET ENEMA 7-19 GM/118ML RE ENEM: 1.0000 | ENEMA | Freq: Every day | RECTAL | Status: DC | PRN

## 2016-10-07 MED ORDER — KETAMINE HCL 10 MG/ML IJ SOLN
INTRAMUSCULAR | Status: AC
  Filled 2016-10-07: qty 1

## 2016-10-07 MED ORDER — LIDOCAINE HCL (PF) 2 % IJ SOLN
INTRAMUSCULAR | Status: AC
  Filled 2016-10-07: qty 8

## 2016-10-07 MED ORDER — MENTHOL 3 MG MT LOZG
1.0000 | LOZENGE | OROMUCOSAL | Status: DC | PRN
  Filled 2016-10-07: qty 9

## 2016-10-07 MED ORDER — OXYCODONE-ACETAMINOPHEN 5-325 MG PO TABS
2.0000 | ORAL_TABLET | ORAL | Status: DC | PRN
  Administered 2016-10-08 – 2016-10-09 (×6): 2 via ORAL
  Filled 2016-10-07 (×7): qty 2

## 2016-10-07 MED ORDER — SIMETHICONE 80 MG PO CHEW
80.0000 mg | CHEWABLE_TABLET | Freq: Three times a day (TID) | ORAL | Status: DC
  Administered 2016-10-07 – 2016-10-11 (×13): 80 mg via ORAL
  Filled 2016-10-07 (×13): qty 1

## 2016-10-07 MED ORDER — ESCITALOPRAM OXALATE 10 MG PO TABS
20.0000 mg | ORAL_TABLET | Freq: Every day | ORAL | Status: DC
  Administered 2016-10-08 – 2016-10-11 (×4): 20 mg via ORAL
  Filled 2016-10-07 (×5): qty 2

## 2016-10-07 MED ORDER — CEFAZOLIN SODIUM-DEXTROSE 2-4 GM/100ML-% IV SOLN: 2.0000 g | Freq: Once | INTRAVENOUS | Status: DC

## 2016-10-07 MED ORDER — MEASLES, MUMPS & RUBELLA VAC ~~LOC~~ INJ
0.5000 mL | INJECTION | Freq: Once | SUBCUTANEOUS | Status: DC
  Filled 2016-10-07: qty 0.5

## 2016-10-07 MED ORDER — ACETAMINOPHEN 10 MG/ML IV SOLN
INTRAVENOUS | Status: AC
  Filled 2016-10-07: qty 100

## 2016-10-07 MED ORDER — BUPIVACAINE HCL (PF) 0.25 % IJ SOLN
INTRAMUSCULAR | Status: DC | PRN
  Administered 2016-10-07: 1 mL via EPIDURAL

## 2016-10-07 MED ORDER — SENNOSIDES-DOCUSATE SODIUM 8.6-50 MG PO TABS
2.0000 | ORAL_TABLET | ORAL | Status: DC
  Administered 2016-10-08 – 2016-10-10 (×3): 2 via ORAL
  Filled 2016-10-07 (×4): qty 2

## 2016-10-07 MED ORDER — DIBUCAINE 1 % RE OINT: 1.0000 "application " | TOPICAL_OINTMENT | RECTAL | Status: DC | PRN

## 2016-10-07 MED ORDER — SODIUM CHLORIDE FLUSH 0.9 % IV SOLN
INTRAVENOUS | Status: AC
  Filled 2016-10-07: qty 30

## 2016-10-07 MED ORDER — ACETAMINOPHEN 325 MG PO TABS
650.0000 mg | ORAL_TABLET | ORAL | Status: DC | PRN
  Administered 2016-10-07: 650 mg via ORAL
  Filled 2016-10-07: qty 2

## 2016-10-07 MED ORDER — FENTANYL CITRATE (PF) 100 MCG/2ML IJ SOLN
INTRAMUSCULAR | Status: AC
  Filled 2016-10-07: qty 2

## 2016-10-07 MED ORDER — CEFAZOLIN SODIUM-DEXTROSE 2-3 GM-% IV SOLR
2.0000 g | Freq: Once | INTRAVENOUS | Status: AC
  Administered 2016-10-07: 2 g via INTRAVENOUS
  Filled 2016-10-07: qty 50

## 2016-10-07 MED ORDER — FENTANYL 2.5 MCG/ML W/ROPIVACAINE 0.2% IN NS 100 ML EPIDURAL INFUSION (ARMC-ANES)
EPIDURAL | Status: DC | PRN
  Administered 2016-10-07: 10 mL/h via EPIDURAL

## 2016-10-07 MED ORDER — BUPIVACAINE HCL (PF) 0.25 % IJ SOLN
INTRAMUSCULAR | Status: AC
  Filled 2016-10-07: qty 30

## 2016-10-07 MED ORDER — LACTATED RINGERS IV SOLN
INTRAVENOUS | Status: DC
  Administered 2016-10-07: 19:00:00 via INTRAVENOUS

## 2016-10-07 MED ORDER — LIDOCAINE HCL (PF) 2 % IJ SOLN
INTRAMUSCULAR | Status: DC | PRN
  Administered 2016-10-07: 3 mL via EPIDURAL
  Administered 2016-10-07: 2 mL via EPIDURAL
  Administered 2016-10-07: 3 mL via EPIDURAL
  Administered 2016-10-07: 5 mL via EPIDURAL
  Administered 2016-10-07: 2 mL via EPIDURAL
  Administered 2016-10-07: 5 mL via EPIDURAL

## 2016-10-07 MED ORDER — BUPIVACAINE LIPOSOME 1.3 % IJ SUSP
INTRAMUSCULAR | Status: AC
  Filled 2016-10-07: qty 20

## 2016-10-07 MED ORDER — WITCH HAZEL-GLYCERIN EX PADS: 1.0000 "application " | MEDICATED_PAD | CUTANEOUS | Status: DC | PRN

## 2016-10-07 MED ORDER — LIDOCAINE-EPINEPHRINE (PF) 1.5 %-1:200000 IJ SOLN
INTRAMUSCULAR | Status: DC | PRN
  Administered 2016-10-07: 3 mL

## 2016-10-07 MED ORDER — COCONUT OIL OIL
1.0000 "application " | TOPICAL_OIL | Status: DC | PRN
  Filled 2016-10-07: qty 120

## 2016-10-07 MED ORDER — CEFAZOLIN IN D5W 1 GM/50ML IV SOLN
INTRAVENOUS | Status: AC
  Filled 2016-10-07: qty 50

## 2016-10-07 MED ORDER — BUPIVACAINE HCL (PF) 0.5 % IJ SOLN
INTRAMUSCULAR | Status: AC
  Filled 2016-10-07: qty 30

## 2016-10-07 MED ORDER — DIPHENHYDRAMINE HCL 25 MG PO CAPS: 25.0000 mg | ORAL_CAPSULE | Freq: Four times a day (QID) | ORAL | Status: DC | PRN

## 2016-10-07 MED ORDER — BUPIVACAINE LIPOSOME 1.3 % IJ SUSP
INTRAMUSCULAR | Status: DC | PRN
  Administered 2016-10-07: 60 mL

## 2016-10-07 SURGICAL SUPPLY — 41 items
ARTERIAL BLOOD GAS ×2 IMPLANT
BAG COUNTER SPONGE EZ (MISCELLANEOUS) ×8 IMPLANT
BARRIER ADHS 3X4 INTERCEED (GAUZE/BANDAGES/DRESSINGS) ×2 IMPLANT
CANISTER SUCT 3000ML (MISCELLANEOUS) ×2 IMPLANT
CATH KIT ON-Q SILVERSOAK 5IN (CATHETERS) IMPLANT
CHLORAPREP W/TINT 26ML (MISCELLANEOUS) ×2 IMPLANT
DRSG TELFA 3X8 NADH (GAUZE/BANDAGES/DRESSINGS) ×2 IMPLANT
ELECT CAUTERY BLADE 6.4 (BLADE) ×2 IMPLANT
ELECT REM PT RETURN 9FT ADLT (ELECTROSURGICAL) ×2
ELECTRODE REM PT RTRN 9FT ADLT (ELECTROSURGICAL) ×1 IMPLANT
GAUZE SPONGE 4X4 12PLY STRL (GAUZE/BANDAGES/DRESSINGS) ×2 IMPLANT
GLOVE BIOGEL PI IND STRL 7.0 (GLOVE) ×3 IMPLANT
GLOVE BIOGEL PI INDICATOR 7.0 (GLOVE) ×3
GLOVE SKINSENSE NS SZ7.0 (GLOVE) ×3
GLOVE SKINSENSE STRL SZ7.0 (GLOVE) ×3 IMPLANT
GOWN STRL REUS W/ TWL LRG LVL3 (GOWN DISPOSABLE) ×3 IMPLANT
GOWN STRL REUS W/TWL LRG LVL3 (GOWN DISPOSABLE) ×3
NEEDLE HYPO 22GX1.5 SAFETY (NEEDLE) ×2 IMPLANT
NEEDLE HYPO 25X1 1.5 SAFETY (NEEDLE) ×4 IMPLANT
NS IRRIG 1000ML POUR BTL (IV SOLUTION) ×2 IMPLANT
PAD OB MATERNITY 4.3X12.25 (PERSONAL CARE ITEMS) ×2 IMPLANT
PAD PREP 24X41 OB/GYN DISP (PERSONAL CARE ITEMS) ×2 IMPLANT
RTRCTR C-SECT PINK 25CM LRG (MISCELLANEOUS) ×2 IMPLANT
SPONGE LAP 18X18 5 PK (GAUZE/BANDAGES/DRESSINGS) ×10 IMPLANT
STAPLER INSORB 30 2030 C-SECTI (MISCELLANEOUS) ×2 IMPLANT
STRAP SAFETY BODY (MISCELLANEOUS) ×2 IMPLANT
SUT MNCRL 4-0 (SUTURE)
SUT MNCRL 4-0 27XMFL (SUTURE)
SUT PDS AB 1 TP1 96 (SUTURE) IMPLANT
SUT PLAIN 2 0 XLH (SUTURE) IMPLANT
SUT PLAIN GUT 2-0 30 C14 SG823 (SUTURE) ×2
SUT VIC AB 0 CT1 36 (SUTURE) ×8 IMPLANT
SUT VIC AB 0 CTX 36 (SUTURE) ×3
SUT VIC AB 0 CTX36XBRD ANBCTRL (SUTURE) ×3 IMPLANT
SUT VIC AB 2-0 SH 27 (SUTURE) ×1
SUT VIC AB 2-0 SH 27XBRD (SUTURE) ×1 IMPLANT
SUT VIC AB 3-0 SH 27 (SUTURE)
SUT VIC AB 3-0 SH 27X BRD (SUTURE) IMPLANT
SUTURE MNCRL 4-0 27XMF (SUTURE) IMPLANT
SUTURE PLN GUT2-0 30 C14 SG823 (SUTURE) ×1 IMPLANT
SYR 30ML LL (SYRINGE) ×4 IMPLANT

## 2016-10-07 SURGICAL SUPPLY — 22 items
CANISTER SUCT 3000ML (MISCELLANEOUS) ×2 IMPLANT
CATH KIT ON-Q SILVERSOAK 5IN (CATHETERS) ×2 IMPLANT
CHLORAPREP W/TINT 26ML (MISCELLANEOUS) ×2 IMPLANT
DRSG TELFA 3X8 NADH (GAUZE/BANDAGES/DRESSINGS) ×2 IMPLANT
ELECT REM PT RETURN 9FT ADLT (ELECTROSURGICAL) ×2
ELECTRODE REM PT RTRN 9FT ADLT (ELECTROSURGICAL) ×1 IMPLANT
GAUZE SPONGE 4X4 12PLY STRL (GAUZE/BANDAGES/DRESSINGS) ×2 IMPLANT
GOWN STRL REUS W/ TWL LRG LVL3 (GOWN DISPOSABLE) ×3 IMPLANT
GOWN STRL REUS W/TWL LRG LVL3 (GOWN DISPOSABLE) ×3
NS IRRIG 1000ML POUR BTL (IV SOLUTION) ×2 IMPLANT
PAD OB MATERNITY 4.3X12.25 (PERSONAL CARE ITEMS) ×2 IMPLANT
PAD PREP 24X41 OB/GYN DISP (PERSONAL CARE ITEMS) ×2 IMPLANT
SUT MNCRL 4-0 (SUTURE)
SUT MNCRL 4-0 27XMFL (SUTURE)
SUT PDS AB 1 TP1 96 (SUTURE) ×2 IMPLANT
SUT PLAIN 2 0 XLH (SUTURE) ×2 IMPLANT
SUT PLAIN GUT 2-0 30 C14 SG823 (SUTURE) ×2
SUT VIC AB 0 CT1 36 (SUTURE) ×6 IMPLANT
SUT VIC AB 3-0 SH 27 (SUTURE) ×1
SUT VIC AB 3-0 SH 27X BRD (SUTURE) ×1 IMPLANT
SUTURE MNCRL 4-0 27XMF (SUTURE) IMPLANT
SUTURE PLN GUT2-0 30 C14 SG823 (SUTURE) ×1 IMPLANT

## 2016-10-07 NOTE — Progress Notes (Signed)
35yo G1P0 now at 39+3wks with arrest of dilation/descent for many hours. She presented with a fully dilated cervix and bulging bag, but even with AROM no progression was made. At her request, and while the FHT remained reassuring and maternal status not acute, we continued to trial of labor. However, she has not made change in 16 hours, and we are proceeding to cesarean section.  The risks of cesarean section discussed with the patient included but were not limited to: bleeding which may require transfusion or reoperation; infection which may require antibiotics; injury to bowel, bladder, ureters or other surrounding organs; injury to the fetus; need for additional procedures including hysterectomy in the event of a life-threatening hemorrhage; placental abnormalities wth subsequent pregnancies, incisional problems, thromboembolic phenomenon and other postoperative/anesthesia complications. The patient concurred with the proposed plan, giving informed written consent for the procedure.   Patient has been NPO since yesterday she will remain NPO for procedure. Anesthesia and OR aware. Preoperative prophylactic antibiotics and SCDs ordered on call to the OR.  To OR when ready.   Patient ID: Sue Hernandez, female   DOB: 11/07/1980, 36 y.o.   MRN: [id number]

## 2016-10-07 NOTE — Progress Notes (Signed)
Sue Hernandez is a 36 y.o. G1P0 at 6438w3d.   Subjective: Pt accepts LTCS  Objective: BP 125/70   Pulse 86   Temp 98.3 F (36.8 C) (Oral)   Resp (!) 24   Ht 5' 3.6" (1.615 m)   Wt 111.6 kg (246 lb)   LMP 01/05/2016   BMI 42.76 kg/m  I/O last 3 completed shifts: In: 687.5 [I.V.:687.5] Out: -  Total I/O In: -  Out: 350 [Urine:350]  FHT: 150, occas variable, Cat 2 UC:  q 2 to 4 mins,  SVE:   Dilation: 9 Effacement (%): 100 Station: -1 Exam by:: NB  Labs: Lab Results  Component Value Date   WBC 16.3 (H) 10/06/2016   HGB 12.2 10/06/2016   HCT 36.5 10/06/2016   MCV 79.3 (L) 10/06/2016   PLT 305 10/06/2016    Assessment / Plan: A: 1. IUP at term. 2. Fail to progress P: 1. Urgent LTCS called. 2. Pt and partner agree with risks including infection, bleeding, damage to internal organs and risks of anesthesia. Pt has effective epidural and Dr Sue Hernandez has been called and enroute to do the LTCS.  3. OR aware and informed that Dr Sue Hernandez has to call Dr Sue Hernandez to bump a hip surgery after the I&D surgery. Pt has made no progress x 5 hours and labor has been proved to be effective.     Sue Hernandez 10/07/2016, 1:05 PM

## 2016-10-07 NOTE — Progress Notes (Signed)
Sue Hernandez is a 36 y.o. G1P0 at 6754w3d  admitted for active labor Subjective: I am comfortable and feeling some pressure and want to continue instead of doing a C-section right now  Objective: BP 121/80   Pulse 73   Temp 98.3 F (36.8 C) (Oral)   Resp (!) 24   Ht 5' 3.6" (1.615 m)   Wt 111.6 kg (246 lb)   LMP 01/05/2016   BMI 42.76 kg/m  I/O last 3 completed shifts: In: 687.5 [I.V.:687.5] Out: -  Total I/O In: -  Out: 350 [Urine:350]  FHT:  140, Early decels, Cat1, mod variability UC:  q 1.5-3 mins, pt is now adequate on 9.5 mun/min of PItocin SVE:   Dilation: Lip/rim Effacement (%): 100 Station: 0 Exam by:: Brogan Martis CNM  Labs: Lab Results  Component Value Date   WBC 16.3 (H) 10/06/2016   HGB 12.2 10/06/2016   HCT 36.5 10/06/2016   MCV 79.3 (L) 10/06/2016   PLT 305 10/06/2016    Assessment / Plan: A: 1. IUP at term 2. Active Labor with Pitocin drip P: Disc poss of lTCS with pt and family and pt does not want a Csection. She really wants to continue to labor until she pushes her baby out. ON exam, it feels like the baby is trying to rotate and pt placed in hands and knees to faciliate the rotation from LOT.  2. Continue to labor and re-evaluate as appropriate. 3. Anesthesia in to rebolus pt.   Sharee PimpleCaron W Therron Sells 10/07/2016, 11:32 AM

## 2016-10-07 NOTE — Anesthesia Preprocedure Evaluation (Signed)
Anesthesia Evaluation  Patient identified by MRN, date of birth, ID band Patient awake    Reviewed: Allergy & Precautions, NPO status , Patient's Chart, lab work & pertinent test results  History of Anesthesia Complications Negative for: history of anesthetic complications  Airway Mallampati: III  TM Distance: >3 FB Neck ROM: Full    Dental no notable dental hx.    Pulmonary neg pulmonary ROS, neg sleep apnea, neg COPD,    breath sounds clear to auscultation- rhonchi (-) wheezing      Cardiovascular Exercise Tolerance: Good (-) hypertension(-) CAD and (-) Past MI  Rhythm:Regular Rate:Normal - Systolic murmurs and - Diastolic murmurs    Neuro/Psych  Headaches, PSYCHIATRIC DISORDERS Depression    GI/Hepatic negative GI ROS, Neg liver ROS,   Endo/Other  negative endocrine ROSneg diabetes  Renal/GU negative Renal ROS     Musculoskeletal negative musculoskeletal ROS (+)   Abdominal (+) + obese, Gravid abdomen  Peds  Hematology negative hematology ROS (+)   Anesthesia Other Findings Past Medical History: No date: Depression     Comment: Dr. Ardelle AntonWagoner No date: Eating disorder     Comment: bulemia No date: Migraines   Reproductive/Obstetrics (+) Pregnancy                             Anesthesia Physical Anesthesia Plan  ASA: II  Anesthesia Plan: Epidural   Post-op Pain Management:    Induction:   Airway Management Planned:   Additional Equipment:   Intra-op Plan:   Post-operative Plan:   Informed Consent: I have reviewed the patients History and Physical, chart, labs and discussed the procedure including the risks, benefits and alternatives for the proposed anesthesia with the patient or authorized representative who has indicated his/her understanding and acceptance.     Plan Discussed with: Anesthesiologist  Anesthesia Plan Comments: (Plan for epidural for labor, discussed  epidural vs spinal vs GA if need for csection)        Lab Results  Component Value Date   WBC 16.3 (H) 10/06/2016   HGB 12.2 10/06/2016   HCT 36.5 10/06/2016   MCV 79.3 (L) 10/06/2016   PLT 305 10/06/2016    Anesthesia Quick Evaluation

## 2016-10-07 NOTE — Transfer of Care (Signed)
Immediate Anesthesia Transfer of Care Note  Patient: Sue LevelsAlyse K Hernandez  Procedure(s) Performed: Procedure(s): CESAREAN SECTION (N/A)  Patient Location: PACU  Anesthesia Type:Epidural  Level of Consciousness: awake, alert  and oriented  Airway & Oxygen Therapy: Patient Spontanous Breathing  Post-op Assessment: Report given to RN and Post -op Vital signs reviewed and stable  Post vital signs: Reviewed and stable  Last Vitals:  Vitals:   10/07/16 1237 10/07/16 1556  BP: 125/70 (!) 149/89  Pulse: 86 70  Resp:  (!) 21  Temp:  36.8 C    Last Pain:  Vitals:   10/07/16 1556  TempSrc: Temporal  PainSc:          Complications: No apparent anesthesia complications

## 2016-10-07 NOTE — Anesthesia Post-op Follow-up Note (Cosign Needed)
Anesthesia QCDR form completed.        

## 2016-10-07 NOTE — Progress Notes (Addendum)
Sue Hernandez is a 36 y.o. G1P0 at 8161w3d by dating per LMP of 01/04/17  & EDD of 10/11/16. Pt wanted to go naturally and over the past hour has not progressed. Her cx is now present on her Rt side and she is not able to push even though she has been grunting and pushing down for seconds, this has aggravated the cx causing it to come back. At this point, it is not swollen so recommend to pt that an epidural is in order. Pt agreed. Her partner and mom disagreed but, the pt told them she could not do it without the epidural.   Subjective: "I feel some relief now"  Objective: BP 137/86   Pulse 87   Temp 97.7 F (36.5 C) (Oral)   Resp 18   LMP 01/05/2016  No intake/output data recorded. No intake/output data recorded.  FHT: 145 UC:  q 5 mins. SVE:   Dilation: Cx is present on the Pt's Rt side approx 8cms/90%/vtx-2. ON the Lt side, no cx is present. Vtx is either OP or OT.  Labs: Lab Results  Component Value Date   WBC 16.3 (H) 10/06/2016   HGB 12.2 10/06/2016   HCT 36.5 10/06/2016   MCV 79.3 (L) 10/06/2016   PLT 305 10/06/2016  IUPC and IFSE placed due to fetal decels noted after epidural. FHR down to 80 with return to 135, UC are q 4 mins with 50-7155mm  Assessment / Plan: A: 1. IUP at term 2. GBS neg 3, Epidural effective P:1. Allow baby to labor down. 2. Start Pitocin per protocol 3. Monitor UC/FHT's   Sharee Pimplearon W Jones 10/07/2016, 2:16 AM

## 2016-10-07 NOTE — Anesthesia Procedure Notes (Signed)
Date/Time: 10/07/2016 2:14 PM Performed by: Ginger CarneMICHELET, Harden Bramer Pre-anesthesia Checklist: Patient identified, Emergency Drugs available, Suction available, Patient being monitored and Timeout performed Patient Re-evaluated:Patient Re-evaluated prior to inductionOxygen Delivery Method: Nasal cannula

## 2016-10-07 NOTE — Anesthesia Procedure Notes (Signed)
Epidural Patient location during procedure: OB Start time: 10/07/2016 1:43 AM End time: 10/07/2016 2:16 AM  Staffing Anesthesiologist: Alver FisherPENWARDEN, Ariel Dimitri Performed: anesthesiologist   Preanesthetic Checklist Completed: patient identified, site marked, surgical consent, pre-op evaluation, timeout performed, IV checked, risks and benefits discussed and monitors and equipment checked  Epidural Patient position: sitting Prep: ChloraPrep Patient monitoring: heart rate, continuous pulse ox and blood pressure Approach: midline Location: L4-L5 Injection technique: LOR saline  Needle:  Needle type: Tuohy  Needle gauge: 18 G Needle length: 9 cm and 9 Needle insertion depth: 8.5 cm Catheter type: closed end flexible Catheter size: 20 Guage Catheter at skin depth: 13 cm Test dose: negative  Assessment Events: blood not aspirated, injection not painful, no injection resistance, negative IV test and no paresthesia  Additional Notes CSE performed, 1mL 0.25% bupivacaine given for spinal dose with needle through needle technique  Patient tolerated the insertion well without complications.Reason for block:procedure for pain

## 2016-10-07 NOTE — Lactation Note (Signed)
This note was copied from a baby's chart. Lactation Consultation Note  Patient Name: Sue Hernandez QQVZD'GToday's Date: 10/07/2016 Reason for consult: Initial assessment P1 Mom after attempted vag delivery then C/S. Attended BF classes. Eager to nurse. Baby very sleepy and spitty. Mom able to hand express drops of colostrum which he licked off. We attempted to have him breastfeed skin to skin in cradle and football hold on both breasts. He suckled of on for a couple minutes total after trying for approx 15 minutes. When he got hiccoughs, we just let him relax skin to skin with Mom. Dad very supportive and helping with the position/latch well. Mom encouraged to continue with lots of hand expression before, during and between feeds to help overall supply and feed any collected in spoon to baby. LC to F/U in a.m.   Maternal Data    Feeding Length of feed: 2 min  LATCH Score/Interventions Latch: Repeated attempts needed to sustain latch, nipple held in mouth throughout feeding, stimulation needed to elicit sucking reflex. Intervention(s): Adjust position;Assist with latch;Breast massage;Breast compression  Audible Swallowing: A few with stimulation Intervention(s): Hand expression  Type of Nipple: Everted at rest and after stimulation  Comfort (Breast/Nipple): Soft / non-tender     Hold (Positioning): Assistance needed to correctly position infant at breast and maintain latch. Intervention(s): Skin to skin;Support Pillows  LATCH Score: 7  Lactation Tools Discussed/Used     Consult Status Consult Status: Follow-up Date: 10/08/16 Follow-up type: In-patient    Sue Hernandez 10/07/2016, 5:20 PM

## 2016-10-07 NOTE — Op Note (Signed)
  Cesarean Section Procedure Note  Date of procedure: 10/07/2016   Pre-operative Diagnosis: Intrauterine pregnancy at 6092w3d; arrest of dilation; Cat II strip  Post-operative Diagnosis: same, delivered.  Procedure: Primary Low Transverse Cesarean Section through Pfannenstiel incision  Surgeon: Christeen DouglasBethany Shimeka Bacot, MD  Assistant(s):  Milon Scorearon Jones, CNM  Anesthesia: Epidural anesthesia, IV regional and nitrous during procedure  Anesthesiologist: Lenard SimmerAndrew Karenz, MD Anesthesiologist: Alver FisherAmy Penwarden, MD; Lenard SimmerAndrew Karenz, MD CRNA: Ginger CarneStephanie Michelet, CRNA  Estimated Blood Loss:  700mL         Drains: none         Total IV Fluids: 1300ml  Urine Output: 200ml         Specimens: Card gas         Complications:  None; patient tolerated the procedure well.         Disposition: PACU - hemodynamically stable.         Condition: stable  Findings:  A female infant "Alex" in OP presentation. Amniotic fluid - Meconium  Birth weight 3650 g.  Apgars of 8 and 9 at one and five minutes respectively.  Intact placenta with a three-vessel cord.  Grossly normal uterus, tubes and ovaries bilaterally. no intraabdominal adhesions were noted.  Indications: failure to progress: arrest of dilation, malpresentation: OP and non-reassuring fetal status  Procedure Details  The patient was taken to Operating Room, identified as the correct patient and the procedure verified as C-Section Delivery. A formal Time Out was held with all team members present and in agreement.  After induction of anesthesia, the patient was draped and prepped in the usual sterile manner. A Pfannenstiel skin incision was made and carried down through the subcutaneous tissue to the fascia. Fascial incision was made and extended transversely with the Mayo scissors. The fascia was separated from the underlying rectus tissue superiorly and inferiorly. The peritoneum was identified and entered bluntly. Peritoneal incision was extended  longitudinally. The utero-vesical peritoneal reflection was incised transversely and a bladder flap was created digitally.   A low transverse hysterotomy was made. The fetus was delivered atraumatically. The umbilical cord was clamped x2 and cut and the infant was handed to the awaiting pediatricians. The placenta was removed intact and appeared normal, intact, and with a 3-vessel cord.   The uterus was exteriorized and cleared of all clot and debris. The hysterotomy was closed with running sutures of 0-Vicryl. A second imbricating layer was placed with the same suture. Excellent hemostasis was observed. The peritoneal cavity was cleared of all clots and debris. The uterus was returned to the abdomen.   The pelvis was irrigated and again, excellent hemostasis was noted. The fascia was then reapproximated with running sutures of 0 Vicryl. At this point, the patient began feeling burning on her right fascial angle when it was grasped with an Allis. The burning spread along her incision. 8ml of 2% lidocaine was injected into this angle. Nitrous was offered and accepted.   The subcutaneous tissue was reapproximated with running sutures of 0 Vicry. The skin was reapproximated with Absorable staples.  20ml (in 30 of 0.5% bupivicaine and 50ml of NSS) of liposomal bupivicaine placed in the fascial and skin lines, avoiding the far right angle.  Instrument, sponge, and needle counts were correct prior to the abdominal closure and at the conclusion of the case.   The patient tolerated the procedure well and was transferred to the recovery room in stable condition.   Christeen DouglasBEASLEY, Shadi Larner, MD 10/07/2016

## 2016-10-07 NOTE — Plan of Care (Signed)
Problem: Activity: Goal: Ability to tolerate increased activity will improve Outcome: Progressing TCDB Q2h This evening  Problem: Nutritional: Goal: Dietary intake will improve Outcome: Progressing Tolerating Reg. Diet  Problem: Respiratory: Goal: Ability to maintain adequate ventilation will improve Outcome: Progressing Demonstrates correct use of Incentive Spirometer  Problem: Urinary Elimination: Goal: Ability to reestablish a normal urinary elimination pattern will improve Outcome: Not Progressing Has Indwelling Urinary Catheter

## 2016-10-07 NOTE — Progress Notes (Addendum)
Sue Hernandez is a 36 y.o. G1P0 at 1860w3d by dating admitted for active labor  Subjective: "I am comfortable"  Objective: BP 118/61 (BP Location: Left Arm)   Pulse 67   Temp 98 F (36.7 C) (Oral)   Resp 18   LMP 01/05/2016  I/O last 3 completed shifts: In: 687.5 [I.V.:687.5] Out: -  No intake/output data recorded.  FHT: 140, Cat 2, 2 variable to 90-100 UC:  q 3-4 mins, MVU's: 200 SVE:  Cx is now present all the way around the vtx at 7.5cms Station: Vtx in LOT at -1/0 station Pitocin at 1 mun/min Labs: Lab Results  Component Value Date   WBC 16.3 (H) 10/06/2016   HGB 12.2 10/06/2016   HCT 36.5 10/06/2016   MCV 79.3 (L) 10/06/2016   PLT 305 10/06/2016  Baby is LOT position on US. Attempted to rotate baby by going side to side. Will try the peanut ball to see if the baby will rotate to OA. Attempted to see if baby would move  Assessment / Plan: A: 1. IUP at term 2. GBS neg 3. Active Labor with Pitocin Labor: active phase of arrest Preeclampsia: none Fetal Wellbeing:  Reassuring, Cat 2, mod variabiltiy Pain Control:  Epidural I/D: NOne Anticipated MOD:  Will continue to evaluate pt. We discussed the possibility of C-section and the risks including infection, bleeding, damage to internal organs and risks of anesthesia. Pt is aware and agrees with proceeding with seeing if the baby can rotate from LOT to OA. Will continue to increase Pitocin for effective labor process.  Sue Hernandez 10/07/2016, 7:13 AM

## 2016-10-08 LAB — RPR: RPR Ser Ql: NONREACTIVE

## 2016-10-08 LAB — CBC
HCT: 27.4 % — ABNORMAL LOW (ref 35.0–47.0)
Hemoglobin: 9.1 g/dL — ABNORMAL LOW (ref 12.0–16.0)
MCH: 26.3 pg (ref 26.0–34.0)
MCHC: 33 g/dL (ref 32.0–36.0)
MCV: 79.7 fL — ABNORMAL LOW (ref 80.0–100.0)
Platelets: 216 10*3/uL (ref 150–440)
RBC: 3.44 MIL/uL — ABNORMAL LOW (ref 3.80–5.20)
RDW: 15.8 % — ABNORMAL HIGH (ref 11.5–14.5)
WBC: 20.4 10*3/uL — ABNORMAL HIGH (ref 3.6–11.0)

## 2016-10-08 MED ORDER — FERROUS SULFATE 325 (65 FE) MG PO TABS
325.0000 mg | ORAL_TABLET | Freq: Three times a day (TID) | ORAL | Status: DC
  Administered 2016-10-08 – 2016-10-11 (×11): 325 mg via ORAL
  Filled 2016-10-08 (×11): qty 1

## 2016-10-08 MED ORDER — DOCUSATE SODIUM 100 MG PO CAPS
100.0000 mg | ORAL_CAPSULE | Freq: Two times a day (BID) | ORAL | Status: DC
  Administered 2016-10-08 – 2016-10-09 (×4): 100 mg via ORAL
  Filled 2016-10-08 (×5): qty 1

## 2016-10-08 NOTE — Progress Notes (Addendum)
Subjective: Postpartum Day 1: Cesarean Delivery Patient reports feeling well. Nursing infant well.     Objective: Vital signs in last 24 hours: Temp:  [98 F (36.7 C)-98.5 F (36.9 C)] 98.4 F (36.9 C) (03/18 1207) Pulse Rate:  [67-88] 84 (03/18 1207) Resp:  [15-21] 18 (03/18 1207) BP: (114-155)/(61-96) 129/73 (03/18 1207) SpO2:  [97 %-100 %] 100 % (03/18 1207)  Physical Exam:  General: A,A&O x3 Heart: s1S2, RRR, No M/R/G Lungs: CTA bilat, no W/R/R Lochia:mod, no clots Uterine Fundus:FF. Incision: C/D/I DVT Evaluation:Neg Homans Taking po well Voiding well.    Recent Labs  10/06/16 2123 10/08/16 0425  HGB 12.2 9.1*  HCT 36.5 27.4*    Assessment/Plan: Status post Cesarean section.STable condition Fe added po.  P: Continue PP care  Sharee Pimplearon W Tuwanna Krausz 10/08/2016, 12:50 PM

## 2016-10-08 NOTE — Anesthesia Postprocedure Evaluation (Signed)
Anesthesia Post Note  Patient: Sue Hernandez  Procedure(s) Performed: Procedure(s) (LRB): CESAREAN SECTION (N/A)  Patient location during evaluation: Mother Baby Anesthesia Type: Epidural Level of consciousness: awake and alert Pain management: pain level controlled Vital Signs Assessment: post-procedure vital signs reviewed and stable Respiratory status: spontaneous breathing, nonlabored ventilation and respiratory function stable Cardiovascular status: stable Postop Assessment: no headache, no backache and patient able to bend at knees Anesthetic complications: no     Last Vitals:  Vitals:   10/08/16 0349 10/08/16 0750  BP: 130/74 132/68  Pulse: 84 80  Resp: 20 18  Temp: 36.9 C 36.9 C    Last Pain:  Vitals:   10/08/16 0803  TempSrc:   PainSc: (P) 7                  Cleda MccreedyJoseph K Kerryn Tennant

## 2016-10-08 NOTE — Lactation Note (Signed)
This note was copied from a baby's chart. Lactation Consultation Note  Patient Name: Sue Hernandez ZOXWR'UToday's Date: 10/08/2016 Reason for consult: Follow-up assessment   Maternal Data    Feeding Feeding Type: Breast Fed Length of feed: 20 min (5 min. with shield on left, 15 min. without shield on right)  LATCH Score/Interventions Latch: Repeated attempts needed to sustain latch, nipple held in mouth throughout feeding, stimulation needed to elicit sucking reflex. Intervention(s): Adjust position;Assist with latch;Breast compression  Audible Swallowing: A few with stimulation Intervention(s): Skin to skin;Hand expression  Type of Nipple: Everted at rest and after stimulation (after pumping and stimulation)  Comfort (Breast/Nipple): Filling, red/small blisters or bruises, mild/mod discomfort  Problem noted: Cracked, bleeding, blisters, bruises;Mild/Moderate discomfort Interventions  (Cracked/bleeding/bruising/blister): Double electric pump (tried nipple shield, coconut oil) Interventions (Mild/moderate discomfort): Pre-pump if needed  Hold (Positioning): Assistance needed to correctly position infant at breast and maintain latch.  LATCH Score: 6  Lactation Tools Discussed/Used Tools: Nipple Dorris CarnesShields;Pump Nipple shield size: 20 Breast pump type: Double-Electric Breast Pump   Consult Status Consult Status: Follow-up Date: 10/09/16 Follow-up type: In-patient Assisted twice with latch and breastfeeding, baby sucks on tongue, breaks suction repeatedly, pulls tongue back, difficult to get deep latch, mom is getting very sore nipples, pumped 15 min after attempt with symphony pump, obtained drops that I wiped on baby's mouth with gloved finger, mom requested to try nipple shield with 2nd consult , prepumped to obtain drops of colostrum, baby tended to push shield out with tongue, no sustained nursing except for approx 5 min., able to latch without shield on right breast for 15  min. with occ. swallow noted, mom applied coconut oil to nipples after feeding, informed about comfort gels to try after shower and gets a bra on, will continue attempting without shield tonight, prepump to lenghten nipples and pump after especially if poor feeding, there has been no void that parents have seen, mom informed that baby may need to be supplemented with formula if not enough colostrum obtained to supplement and no void or increased wt loss at assessment tonight.      Dyann KiefMarsha D Gared Gillie 10/08/2016, 6:57 PM

## 2016-10-09 ENCOUNTER — Encounter: Payer: Self-pay | Admitting: Obstetrics and Gynecology

## 2016-10-09 MED ORDER — HYDROCODONE-ACETAMINOPHEN 5-325 MG PO TABS: 2.0000 | ORAL_TABLET | Freq: Four times a day (QID) | ORAL | Status: DC | PRN

## 2016-10-09 MED ORDER — NAPROXEN 500 MG PO TABS
500.0000 mg | ORAL_TABLET | Freq: Two times a day (BID) | ORAL | Status: DC | PRN
  Administered 2016-10-09: 500 mg via ORAL
  Filled 2016-10-09 (×2): qty 1

## 2016-10-09 NOTE — Lactation Note (Signed)
This note was copied from a baby's chart. Lactation Consultation Note  Patient Name: Sue Dennison [name] [name]: 10/09/2016  FEEDING PLAN:  SNS at breast with (now) minimum of maybe 15 ml at one breast every 2-3 hours. Pump at least the breast he did not feed on; both when able to (bring supply in more quickly). Try warm compresses, breast massage/expression of milk; skin to skin care etc.    If SNS stops working well, may pump and feed with syringe, or slow flow bottle. LC to F/U in am   Maternal Data    Feeding Feeding Type: Breast Milk with Formula added  LATCH Score/Interventions Latch: Grasps breast easily, tongue down, lips flanged, rhythmical sucking. (with NS and SNS)  Audible Swallowing: Spontaneous and intermittent (only because of SNS)  Type of Nipple: Everted at rest and after stimulation  Comfort (Breast/Nipple): Filling, red/small blisters or bruises, mild/mod discomfort  Problem noted:  (left blister resolving with shell use. Right side still brui)  Hold (Positioning): No assistance needed to correctly position infant at breast.  LATCH Score: 9  Lactation Tools Discussed/Used Tools: Nipple Allysha Tryon Nipple shield size: 24   Consult Status Consult Status: Follow-up Date: 10/10/16 Follow-up type: In-patient    Sunday CornSandra Clark Hollee Fate 10/09/2016, 5:42 PM

## 2016-10-09 NOTE — Lactation Note (Signed)
This note was copied from a baby's chart. Lactation Consultation Note  Patient Name: Sue Hernandez Reason for consult: Follow-up assessment Sue Hernandez rooting now but not latching nursing well again. Mom applied 24 mm nipple shield correctly, dad inserted formula into shield and they got Sue Hernandez to latch and nurse well. Dad sometimes will give him more while latched on with curved tip syringe. After this feed, he needs to go to nursery for urinary cath as he has had no observed void since birth. Mom to pump after this session. We discussed trying the sns at the next session. (Too much going on right now for that).   Maternal Data    Feeding Feeding Type: Breast Milk with Formula added  LATCH Score/Interventions Latch: Grasps breast easily, tongue down, lips flanged, rhythmical sucking. (wiht nipple shield and formula) Intervention(s): Adjust position;Assist with latch;Breast massage (Shells/pump to evert nipples; nipple shield with formula in )  Audible Swallowing: A few with stimulation (more with formula in shield) Intervention(s): Skin to skin;Hand expression (formula in shield)  Type of Nipple: Everted at rest and after stimulation  Comfort (Breast/Nipple): Filling, red/small blisters or bruises, mild/mod discomfort  Problem noted: Cracked, bleeding, blisters, bruises (blisters on face of nipple) Interventions  (Cracked/bleeding/bruising/blister): Expressed breast milk to nipple;Hand pump (comfort gels; coc oil; shells) Interventions (Mild/moderate discomfort): Pre-pump if needed;Post-pump;Comfort gels;Breast Xiao Graul  Hold (Positioning): No assistance needed to correctly position infant at breast.  LATCH Score: 8  Lactation Tools Discussed/Used Tools: Nipple Dorris CarnesShields;Shells;Pump;Comfort gels Nipple shield size: 24 Shell Type: Inverted Breast pump type: Double-Electric Breast Pump   Consult Status Consult Status: Follow-up Date: 10/10/16 Follow-up  type: In-patient    Sunday CornSandra Clark Michaelpaul [name], 1:06 PM

## 2016-10-09 NOTE — Progress Notes (Signed)
CH responded to a request from a family member to visit Pt. Pt is awake but tired as she gave birth earlier today. Father and newborn (son) are bedside. Pt is excited about the new adventures of parenting. CH provided prayer of thanks for the miracle of birth. CH is available for follow up as needed.    10/09/16 1400  Clinical Encounter Type  Visited With Patient;Patient and family together  Visit Type Initial;Spiritual support  Referral From Family  Consult/Referral To Chaplain  Spiritual Encounters  Spiritual Needs Emotional

## 2016-10-09 NOTE — Lactation Note (Signed)
This note was copied from a baby's chart. Lactation Consultation Note  Patient Name: Sue Hernandez ZOXWR'UToday's Date: 10/09/2016 Reason for consult: Follow-up assessment Sue Hernandez breastfed well with 24 mm nipple shield and SNS. He took 18 of the 20 ml offered. Mom to hand express/pump. Baby to go for U/S soon.   Maternal Data    Feeding Feeding Type: Breast Milk with Formula added  LATCH Score/Interventions Latch: Grasps breast easily, tongue down, lips flanged, rhythmical sucking. (with NS and SNS)  Audible Swallowing: Spontaneous and intermittent (only because of SNS)  Type of Nipple: Everted at rest and after stimulation  Comfort (Breast/Nipple): Filling, red/small blisters or bruises, mild/mod discomfort  Problem noted:  (left blister resolving with shell use. Right side still brui)  Hold (Positioning): No assistance needed to correctly position infant at breast.  LATCH Score: 9  Lactation Tools Discussed/Used Tools: [name] Nipple shield size: 24 Shell Type: Inverted Breast pump type: Double-Electric Breast Pump   Consult Status Consult Status: Follow-up Date: 10/10/16 Follow-up type: In-patient    Sunday CornSandra Clark [name] 10/09/2016, 4:30 PM

## 2016-10-09 NOTE — Progress Notes (Signed)
Subjective: Postpartum Day 2: Cesarean Delivery Patient reports no problems voiding.   Pain ok , percocet too strong Objective: Vital signs in last 24 hours: Temp:  [97.8 F (36.6 C)-98.6 F (37 C)] 97.8 F (36.6 C) (03/19 0820) Pulse Rate:  [81-93] 81 (03/19 0820) Resp:  [18-20] 18 (03/19 0820) BP: (111-130)/(69-77) 111/69 (03/19 0820) SpO2:  [97 %-99 %] 98 % (03/19 0820)  Physical Exam:  General: alert and cooperative Lochia: appropriate Uterine Fundus: firm Incision: healing well DVT Evaluation: No evidence of DVT seen on physical exam.   Recent Labs  10/06/16 2123 10/08/16 0425  HGB 12.2 9.1*  HCT 36.5 27.4*    Assessment/Plan: Status post Cesarean section. Doing well postoperatively.  Continue current care. Change to norco  Add naprosyn 500 mg BID ( she takes this at home without problems  SCHERMERHORN,THOMAS 10/09/2016, 12:42 PM

## 2016-10-09 NOTE — Lactation Note (Signed)
This note was copied from a baby's chart. Lactation Consultation Note  Patient Name: Sue Dennison Nancylyse Xiao ONGEX'BToday's Date: 10/09/2016 Reason for consult: Follow-up assessment [name] only has 4% weight loss and many stools since birth, but no voids were observed since then. Mom has been diligent trying to feed him correctly and often. She claims that in the last 24 hours, he has rarely sucked vigorously and well enough for at least 15 minutes of stimulation per breast. Colostrum is observed with her hand expression. [name] is quite fussy and still rooting after Mom tried nursing him on both breast in various positions. Her technique is overall quite good, just needing to move fingers off areola sometimes so he can latch there. Due to his feeding cues, not settling down with breastfeeding and needing to see a void, parents agreed to supplement with more formula now. I inserted formula into shield for him to nurse off of. At first, he clamps down with his gums, but soon develops rhythmic suck swallow since he has volume and flow to work with. Dad demonstrated the technique after I taught it. After a while, Alex would not latch on again. I finished the feeding with him sucking on my gloved finger while using curved syringe at mouth crease. He took a total of 15 ml and relaxed. Mom to now start to pump/hand express every 2-3 hours when [name] is unable to nurse well himself. I am suggesting he get at least 10-15 ml when supplementing is needed Q 2-3 hours, adjusting volume as medically needed /per baby satiety. I discussed feeding options of spoon, syringe, SNS and slow flow bottle (which we all agree is too soon to try at this point).   Maternal Data Formula Feeding for Exclusion: No Has patient been taught Hand Expression?: Yes Does the patient have breastfeeding experience prior to this delivery?: No  Feeding    LATCH Score/Interventions Latch: Repeated attempts needed to sustain latch, nipple held in mouth  throughout feeding, stimulation needed to elicit sucking reflex. (moments of good suck, but often, not) Intervention(s): Adjust position;Assist with latch;Breast massage (Shells/pump to evert nipples; nipple shield with formula in )  Audible Swallowing: A few with stimulation Intervention(s): Skin to skin;Hand expression (formula in shield)  Type of Nipple: Everted at rest and after stimulation (pre pump shells help to eversion)  Comfort (Breast/Nipple): Filling, red/small blisters or bruises, mild/mod discomfort  Problem noted: Cracked, bleeding, blisters, bruises (blisters on face of nipple) Interventions  (Cracked/bleeding/bruising/blister): Expressed breast milk to nipple;Hand pump (comfort gels; coc oil; shells) Interventions (Mild/moderate discomfort): Pre-pump if needed;Post-pump;Comfort gels;Breast Aneesha Holloran  Hold (Positioning): No assistance needed to correctly position infant at breast.  LATCH Score: 7  Lactation Tools Discussed/Used     Consult Status Consult Status: Follow-up Date: 10/10/16 Follow-up type: In-patient    Sunday CornSandra Clark Danh Bayus 10/09/2016, 11:18 AM

## 2016-10-10 MED ORDER — ALUM & MAG HYDROXIDE-SIMETH 200-200-20 MG/5ML PO SUSP
30.0000 mL | ORAL | Status: DC | PRN
  Administered 2016-10-10: 30 mL via ORAL
  Filled 2016-10-10: qty 30

## 2016-10-10 MED ORDER — HYDROCORTISONE 1 % EX CREA
TOPICAL_CREAM | Freq: Three times a day (TID) | CUTANEOUS | Status: DC
  Administered 2016-10-10 – 2016-10-11 (×5): via TOPICAL
  Filled 2016-10-10 (×2): qty 28

## 2016-10-10 NOTE — Progress Notes (Signed)
Subjective: Postpartum Day 3: Cesarean Delivery Patient reports rash that developed yesterday on her abdomen . Pt is having difficulty breast feeding . Baby in nursery and is being worked up for voiding difficulties  Objective: Vital signs in last 24 hours: Temp:  [97.8 F (36.6 C)-98.2 F (36.8 C)] 98 F (36.7 C) (03/20 0749) Pulse Rate:  [74-78] 78 (03/20 0749) Resp:  [18-20] 18 (03/20 0749) BP: (133-139)/(68-83) 133/83 (03/20 0749) SpO2:  [98 %] 98 % (03/20 0749)  Physical Exam:  General: alert and cooperative Lochia: appropriate Uterine Fundus: firm Incision: healing well ABD: diffuse erythema , sparing the incision 4 cm superior and posterior  DVT Evaluation: No evidence of DVT seen on physical exam.   Recent Labs  10/08/16 0425  HGB 9.1*  HCT 27.4*    Assessment/Plan: Status post Cesarean section. Contact dermatitis Add HC 1% cream BID prn  Lactation consult Continue current care.  Leondre Taul 10/10/2016, 9:32 AM

## 2016-10-10 NOTE — Lactation Note (Signed)
This note was copied from a baby's chart. Lactation Consultation Note  Patient Name: Sue Dennison [name] WUJWJ'XToday's Date: 10/10/2016 Reason for consult: Follow-up assessment   Maternal Data    Feeding Feeding Type: Breast Milk with Formula added Length of feed: 30 min  LATCH Score/Interventions Latch: Grasps breast easily, tongue down, lips flanged, rhythmical sucking. Intervention(s): Assist with latch;Adjust position  Audible Swallowing: Spontaneous and intermittent  Type of Nipple: Everted at rest and after stimulation  Comfort (Breast/Nipple): Soft / non-tender     Hold (Positioning): Assistance needed to correctly position infant at breast and maintain latch. Intervention(s): Breastfeeding basics reviewed;Support Pillows  LATCH Score: 9  Lactation Tools Discussed/Used Nipple shield size: 24   Consult Status Consult Status: Follow-up Date: 10/11/16  Mom's milk is starting to transition. 15-3720mL supplementations can more than likely begin tonight depending on newborn's continuation of output.    Burnadette PeterJaniya M Sedric Guia 10/10/2016, 4:11 PM

## 2016-10-11 ENCOUNTER — Encounter: Payer: Self-pay | Admitting: Emergency Medicine

## 2016-10-11 MED ORDER — HYDROXYZINE HCL 25 MG PO TABS
25.0000 mg | ORAL_TABLET | ORAL | Status: DC | PRN
  Administered 2016-10-11 (×2): 25 mg via ORAL
  Filled 2016-10-11 (×2): qty 1

## 2016-10-11 MED ORDER — ACETAMINOPHEN 325 MG PO TABS: 650.0000 mg | ORAL_TABLET | ORAL | Status: DC | PRN

## 2016-10-11 MED ORDER — HYDROXYZINE HCL 25 MG PO TABS: 25.0000 mg | ORAL_TABLET | Freq: Four times a day (QID) | ORAL | 1 refills | Status: DC | PRN

## 2016-10-11 MED ORDER — HYDROCODONE-ACETAMINOPHEN 5-325 MG PO TABS: 2.0000 | ORAL_TABLET | ORAL | 0 refills | Status: DC | PRN

## 2016-10-11 NOTE — Discharge Instructions (Signed)

## 2016-10-11 NOTE — Discharge Summary (Signed)
Pt dc'd and follow up appt reviewed and signed by pt. Prescriptions already received. Pt verbalizes understanding of all dc instruction. Pt remains in room with infant who is still under SCN care. Pt is now dc'd from the system as a pt.

## 2016-10-12 ENCOUNTER — Ambulatory Visit: Payer: Self-pay

## 2016-10-12 NOTE — Lactation Note (Signed)
This note was copied from a baby's chart. Lactation Consultation Note  Patient Name: Boy Dennison Nancylyse Dolinar WGNFA'OToday's Date: 10/12/2016 Reason for consult: Follow-up assessment   Maternal Data Formula Feeding for Exclusion: No Reason for exclusion: Mother's choice to formula and breast feed on admission  Feeding Feeding Type: Breast Fed Nipple Type: Slow - flow (see note) Length of feed: 45 min  LATCH Score/Interventions Latch: Grasps breast easily, tongue down, lips flanged, rhythmical sucking. Intervention(s): Adjust position;Assist with latch  Audible Swallowing: Spontaneous and intermittent Intervention(s): Hand expression;Alternate breast massage  Type of Nipple: Everted at rest and after stimulation  Comfort (Breast/Nipple): Filling, red/small blisters or bruises, mild/mod discomfort  Problem noted: Filling Interventions (Filling): Massage;Reverse pressure;Firm support;Double electric pump Interventions  (Cracked/bleeding/bruising/blister): Expressed breast milk to nipple;Double electric pump Interventions (Mild/moderate discomfort): Hand massage;Hand expression;Reverse pressue;Post-pump  Hold (Positioning): Assistance needed to correctly position infant at breast and maintain latch. Intervention(s): Breastfeeding basics reviewed;Support Pillows;Position options  LATCH Score: 8  Lactation Tools Discussed/Used Tools: Supplemental Nutrition System;Nipple Shields Nipple shield size: 24 Shell Type: Inverted Pump Review: Milk Storage  Mother  has been breastfeeding and formula feeding with an SNS and a nipple shield at the breast. She then has been pumping the side she has not fed on for 15 minutes. I suggest that she pump both breasts when done with breastfeeding. She is to follow the following plan: Feed the baby every three hours. Offer the breast and may use SNS with 50 to 60 ml of ebm or formula. If she does not use the SNS she is to offer a bottle with a slow flow nipple  with 50 to 60 ml of ebm/ formula after each attempt at the breast. She should then pump both breasts at the same time for 15 minutes or until empty plus a few minutes. She should  Continue this process until she is reevaluated. We had done a pre and post weight and she did not transfer any breast milk to the baby. I have reviewed how to feed using a slow flow nipple if she cannot use the SNS for a feeding. She should follow the recommendations of the pediatrician and call the pediatrician if the baby has less then 6 wet diapers or less then 2 stools in a 24 hour period. If the baby is not waking to feed> 3 hours or if she feels like her milk is not coming in.   Consult Status Consult Status: Follow-up Date:  (motehr to schedule) Follow-up type: Out-patient    Gilman SchmidtCarolyn P Fairley Copher 10/12/2016, 12:00 PM

## 2019-02-08 ENCOUNTER — Encounter: Payer: Self-pay | Admitting: Emergency Medicine

## 2019-02-08 ENCOUNTER — Other Ambulatory Visit: Payer: Self-pay

## 2019-02-08 ENCOUNTER — Emergency Department
Admission: EM | Admit: 2019-02-08 | Discharge: 2019-02-08 | Disposition: A | Discharge status: Home / Self Care | Payer: BC Managed Care – PPO | Attending: Emergency Medicine | Admitting: Emergency Medicine

## 2019-02-08 DIAGNOSIS — W540XXA Bitten by dog, initial encounter: Secondary | ICD-10-CM | POA: Diagnosis not present

## 2019-02-08 DIAGNOSIS — Y929 Unspecified place or not applicable: Secondary | ICD-10-CM | POA: Insufficient documentation

## 2019-02-08 DIAGNOSIS — Y999 Unspecified external cause status: Secondary | ICD-10-CM | POA: Insufficient documentation

## 2019-02-08 DIAGNOSIS — S3141XA Laceration without foreign body of vagina and vulva, initial encounter: Secondary | ICD-10-CM | POA: Insufficient documentation

## 2019-02-08 DIAGNOSIS — S3994XA Unspecified injury of external genitals, initial encounter: Secondary | ICD-10-CM | POA: Diagnosis present

## 2019-02-08 DIAGNOSIS — Y939 Activity, unspecified: Secondary | ICD-10-CM | POA: Insufficient documentation

## 2019-02-08 MED ORDER — LIDOCAINE HCL (PF) 1 % IJ SOLN
INTRAMUSCULAR | Status: AC
  Administered 2019-02-08: 5 mL
  Filled 2019-02-08: qty 5

## 2019-02-08 MED ORDER — SODIUM CHLORIDE 0.9 % IV SOLN
3.0000 g | Freq: Once | INTRAVENOUS | Status: AC
  Administered 2019-02-08: 3 g via INTRAVENOUS
  Filled 2019-02-08: qty 8

## 2019-02-08 MED ORDER — OXYCODONE-ACETAMINOPHEN 5-325 MG PO TABS: 1.0000 | ORAL_TABLET | Freq: Four times a day (QID) | ORAL | 0 refills | Status: AC | PRN

## 2019-02-08 MED ORDER — ONDANSETRON HCL 4 MG PO TABS: 4.0000 mg | ORAL_TABLET | Freq: Three times a day (TID) | ORAL | 0 refills | Status: AC | PRN

## 2019-02-08 MED ORDER — SULFAMETHOXAZOLE-TRIMETHOPRIM 800-160 MG PO TABS: 1.0000 | ORAL_TABLET | Freq: Two times a day (BID) | ORAL | 0 refills | Status: AC

## 2019-02-08 MED ORDER — AMOXICILLIN-POT CLAVULANATE 875-125 MG PO TABS: 1.0000 | ORAL_TABLET | Freq: Two times a day (BID) | ORAL | 0 refills | Status: AC

## 2019-02-08 MED ORDER — OXYCODONE-ACETAMINOPHEN 5-325 MG PO TABS
1.0000 | ORAL_TABLET | ORAL | Status: DC | PRN
  Administered 2019-02-08: 1 via ORAL
  Filled 2019-02-08: qty 1

## 2019-02-08 MED ORDER — MORPHINE SULFATE (PF) 4 MG/ML IV SOLN
4.0000 mg | Freq: Once | INTRAVENOUS | Status: AC
  Administered 2019-02-08: 4 mg via INTRAVENOUS
  Filled 2019-02-08: qty 1

## 2019-02-08 MED ORDER — LIDOCAINE HCL (PF) 1 % IJ SOLN
INTRAMUSCULAR | Status: AC
  Administered 2019-02-08: 5 mL
  Filled 2019-02-08: qty 10

## 2019-02-08 NOTE — ED Notes (Signed)
Pt given ice pack

## 2019-02-08 NOTE — ED Triage Notes (Signed)
States friends dog bit her right before she came in. 2 large laceration noted mons pubis with no current bleeding. Gauze over. Dog has been vaccinated.

## 2019-02-08 NOTE — Discharge Instructions (Signed)
Keep animal bite wounds clean and dry for the next 24 hours. Take Augmentin twice daily for the next ten days.  Take Bactrim twice daily for the next seven days.  Return to the emergency department with redness or streaking surrounding wounds.

## 2019-02-09 NOTE — ED Provider Notes (Signed)
[name] Emergency Department Provider Note  ____________________________________________  Time seen: Approximately 12:17 AM  I have reviewed the triage vital signs and the nursing notes.   HISTORY  Chief Complaint Animal Bite    HPI Sue Hernandez is a 38 y.o. female presents to the emergency department with 2 large dog bite wounds sustained to the mons pubis after being bitten by her friend's dog who has known rabies negative status.  Patient states that she was going over to a friend's house to hang out.  Patient states that she has interacted with dog several times and has never had any issues.  Patient states that she was standing in the backyard when the dog came up and attacked her.  She states that teeth did not tear her pants.  Her tetanus status is up-to-date.        Past Medical History:  Diagnosis Date  . Depression    Dr. Ardelle AntonWagoner  . Eating disorder    bulemia  . Migraines     Patient Active Problem List   Diagnosis Date Noted  . Indication for care in labor and delivery, antepartum 10/06/2016  . Indication for care in labor or delivery 10/06/2016  . Polyhydramnios affecting pregnancy 09/28/2016  . Obesity (BMI 30-39.9) 08/30/2012  . Depression 08/30/2012  . Migraine 08/30/2012    Past Surgical History:  Procedure Laterality Date  . CESAREAN SECTION N/A 10/07/2016   Procedure: CESAREAN SECTION;  Surgeon: Christeen DouglasBethany Beasley, MD;  Location: [facility] ORS;  Service: Obstetrics;  Laterality: N/A;  . CHOLECYSTECTOMY  2009  . LAPAROSCOPIC GASTRIC BANDING  2009   Dr. Terrill MohrEanic  . LAPAROSCOPIC GASTRIC SLEEVE RESECTION  01/2013  . TONSILLECTOMY  2006  . UPPER GI ENDOSCOPY  2014   hiatial hernia, Dr. Smitty CordsBruce    Prior to Admission medications   Medication Sig Start Date End Date Taking? Authorizing Provider  acetaminophen (TYLENOL) 325 MG tablet Take 2 tablets (650 mg total) by mouth every 4 (four) hours as needed (for pain scale < 4). 10/11/16    Ward, Elenora Fenderhelsea C, MD  amoxicillin-clavulanate (AUGMENTIN) 875-125 MG tablet Take 1 tablet by mouth 2 (two) times daily for 10 days. 02/08/19 02/18/19  Orvil FeilWoods, Marquice Uddin M, PA-C  B Complex Vitamins (B COMPLEX PO) Take by mouth.    [provider]  BIOTIN PO Take by mouth.    [provider]  Cyanocobalamin (B-12 SL) Place under the tongue.    [provider]  Docusate Calcium (STOOL SOFTENER PO) Take by mouth.    [provider]  escitalopram (LEXAPRO) 20 MG tablet TAKE 1 TABLET (20 MG TOTAL) BY MOUTH DAILY. 01/17/16   Shelia MediaWalker, Jennifer A, MD  HYDROcodone-acetaminophen (NORCO/VICODIN) 5-325 MG tablet Take 2 tablets by mouth every 4 (four) hours as needed for severe pain. 10/11/16   Ward, Elenora Fenderhelsea C, MD  hydrOXYzine (ATARAX/VISTARIL) 25 MG tablet Take 1 tablet (25 mg total) by mouth every 6 (six) hours as needed for itching. 10/11/16   Ward, Elenora Fenderhelsea C, MD  ondansetron (ZOFRAN) 4 MG tablet Take 1 tablet (4 mg total) by mouth every 8 (eight) hours as needed for up to 3 days for nausea or vomiting. 02/08/19 02/11/19  Orvil FeilWoods, Teea Ducey M, PA-C  oxyCODONE-acetaminophen (PERCOCET/ROXICET) 5-325 MG tablet Take 1 tablet by mouth every 6 (six) hours as needed for up to 3 days for severe pain. 02/08/19 02/11/19  Orvil FeilWoods, Aleeha Boline M, PA-C  Prenatal Vit-Fe Fumarate-FA (PRENATAL VITAMIN PLUS LOW IRON) 27-1 MG TABS Take 1  tablet by mouth daily. 01/28/16   Jackolyn Confer, MD  sulfamethoxazole-trimethoprim (BACTRIM DS) 800-160 MG tablet Take 1 tablet by mouth 2 (two) times daily for 7 days. 02/08/19 02/15/19  Lannie Fields, PA-C    Allergies Ibuprofen and Derma guard [protective adhesive powder]  Family History  Problem Relation Age of Onset  . Cancer Maternal Grandmother        lung  . Cancer Paternal Grandmother        Breast  . Heart disease Paternal Grandfather   . Cancer Paternal Grandfather        renal cancer went to his lungs  . Cancer Maternal Grandfather        lung    Social  History Social History   Tobacco Use  . Smoking status: Never Smoker  . Smokeless tobacco: Never Used  Substance Use Topics  . Alcohol use: No    Comment: Maybe once a month  . Drug use: No     Review of Systems  Constitutional: No fever/chills Eyes: No visual changes. No discharge ENT: No upper respiratory complaints. Cardiovascular: no chest pain. Respiratory: no cough. No SOB. Gastrointestinal: No abdominal pain.  No nausea, no vomiting.  No diarrhea.  No constipation. Musculoskeletal: Negative for musculoskeletal pain. Skin: Patient has dog bite wounds.  Neurological: Negative for headaches, focal weakness or numbness.  ____________________________________________   PHYSICAL EXAM:  VITAL SIGNS: ED Triage Vitals  Enc Vitals Group     BP 02/08/19 1700 109/79     Pulse Rate 02/08/19 1700 87     Resp 02/08/19 1700 18     Temp 02/08/19 1700 99.2 F (37.3 C)     Temp Source 02/08/19 1700 Oral     SpO2 02/08/19 1700 97 %     Weight 02/08/19 1705 232 lb (105.2 kg)     Height 02/08/19 1705 5\' 3"  (1.6 m)     Head Circumference --      Peak Flow --      Pain Score 02/08/19 1704 10     Pain Loc --      Pain Edu? --      Excl. in [place]? --      Constitutional: Alert and oriented. Well appearing and in no acute distress. Eyes: Conjunctivae are normal. PERRL. EOMI. Head: Atraumatic. Cardiovascular: Normal rate, regular rhythm. Normal S1 and S2.  Good peripheral circulation. Respiratory: Normal respiratory effort without tachypnea or retractions. Lungs CTAB. Good air entry to the bases with no decreased or absent breath sounds. Musculoskeletal: Full range of motion to all extremities. No gross deformities appreciated. Neurologic:  Normal speech and language. No gross focal neurologic deficits are appreciated.  Skin: #1.  6 cm in length by 2 cm in width to underlying adipose tissue.  Laceration is gaping.  #2 laceration is 4 cm in length by 1-1/2 cm in width deep to adipose  tissue and is gaping. Psychiatric: Mood and affect are normal. Speech and behavior are normal. Patient exhibits appropriate insight and judgement.   ____________________________________________   LABS (all labs ordered are listed, but only abnormal results are displayed)  Labs Reviewed - No data to display ____________________________________________  EKG   ____________________________________________  RADIOLOGY   No results found.  ____________________________________________    PROCEDURES  Procedure(s) performed:    Procedures  LACERATION REPAIR Performed by: Lannie Fields Authorized by: Lannie Fields Consent: Verbal consent obtained. Risks and benefits: risks, benefits and alternatives were discussed Consent given by: patient Patient  identity confirmed: provided demographic data Prepped and Draped in normal sterile fashion Wound was copiously irrigated with 500 cc of normal saline with Betadine  Laceration Location: Mons Pubis   Laceration Length:  6 cm  4 cm  No Foreign Bodies seen or palpated  Anesthesia: local infiltration  Local anesthetic: lidocaine 1% without epinephrine  Anesthetic total: 15 ml  Irrigation method: syringe Amount of cleaning: standard  Skin closure:  6 cm laceration:  Superficial: 4-0 Ethilon (11) Deep: 4-0 Monocryl (5)  4 cm laceration (10) Superficial 4-0 Ethilon  Deep: 4-0 Monocryl (4)  Technique:  Superficial: Simple Interrupted Deep: Subcutaneous interrupted   Patient tolerance: Patient tolerated the procedure well with no immediate complications.    Medications  oxyCODONE-acetaminophen (PERCOCET/ROXICET) 5-325 MG per tablet 1 tablet (1 tablet Oral Given 02/08/19 1710)  lidocaine (PF) (XYLOCAINE) 1 % injection (5 mLs  Given 02/08/19 2240)  morphine 4 MG/ML injection 4 mg (4 mg Intravenous Given 02/08/19 2057)  lidocaine (PF) (XYLOCAINE) 1 % injection (5 mLs  Given 02/08/19 2239)  lidocaine (PF) (XYLOCAINE)  1 % injection (5 mLs  Given 02/08/19 2240)  Ampicillin-Sulbactam (UNASYN) 3 g in sodium chloride 0.9 % 100 mL IVPB (0 g Intravenous Stopped 02/08/19 2240)     ____________________________________________   INITIAL IMPRESSION / ASSESSMENT AND PLAN / ED COURSE  Pertinent labs & imaging results that were available during my care of the patient were reviewed by me and considered in my medical decision making (see chart for details).  Review of the [place] CSRS was performed in accordance of the [facility] prior to dispensing any controlled drugs.           Assessment and plan Dog bite wound 38 year old female presents to the emergency department after being bitten by her friend's dog who has known rabies negative status.  Patient had large lacerations to the mons pubis deep to underlying adipose tissue.  I cautioned patient that dog bite wounds repaired with suture are highly susceptible to infection however, due to complicated nature of lacerations, repair with suture was necessary.  Patient's wounds were cleansed copiously with normal saline and Betadine I placed as few deep and external sutures as possible in order to repair lacerations.  Patient was given morphine in the emergency department for pain as well as a loading dose of Unasyn.  Patient was discharged with Bactrim and Augmentin.  I cautioned patient to take medications until completion.  Patient was given strict return precautions to return to the emergency department with redness or streaking surrounding wound sites or foul odor from repaired lacerations.  She voiced understanding.  All patient questions were answered.   ____________________________________________  FINAL CLINICAL IMPRESSION(S) / ED DIAGNOSES  Final diagnoses:  Dog bite, initial encounter      NEW MEDICATIONS STARTED DURING THIS VISIT:  ED Discharge Orders         Ordered    sulfamethoxazole-trimethoprim (BACTRIM DS) 800-160 MG tablet  2 times daily      02/08/19 2242    amoxicillin-clavulanate (AUGMENTIN) 875-125 MG tablet  2 times daily     02/08/19 2242    oxyCODONE-acetaminophen (PERCOCET/ROXICET) 5-325 MG tablet  Every 6 hours PRN     02/08/19 2249    ondansetron (ZOFRAN) 4 MG tablet  Every 8 hours PRN     02/08/19 2249              This chart was dictated using voice recognition software/Dragon. Despite best efforts to proofread, errors can occur  which can change the meaning. Any change was purely unintentional.    Orvil FeilWoods, Carlin Mamone M, PA-C 02/09/19 0033    Arnaldo NatalMalinda, Paul F, MD 02/09/19 [phone]

## 2019-02-26 ENCOUNTER — Other Ambulatory Visit: Payer: Self-pay

## 2019-02-27 ENCOUNTER — Encounter: Payer: Self-pay | Admitting: Family Medicine

## 2019-02-27 ENCOUNTER — Ambulatory Visit (INDEPENDENT_AMBULATORY_CARE_PROVIDER_SITE_OTHER): Payer: Self-pay | Admitting: Family Medicine

## 2019-02-27 ENCOUNTER — Other Ambulatory Visit: Payer: Self-pay

## 2019-02-27 VITALS — BP 120/80 | HR 78 | Temp 98.8°F | Ht 63.0 in | Wt 230.2 lb

## 2019-02-27 DIAGNOSIS — W540XXS Bitten by dog, sequela: Secondary | ICD-10-CM

## 2019-02-27 DIAGNOSIS — S31159S Open bite of abdominal wall, unspecified quadrant without penetration into peritoneal cavity, sequela: Secondary | ICD-10-CM

## 2019-02-27 NOTE — Progress Notes (Signed)
Subjective:    Patient ID: Sue Hernandez, female    DOB: 20-Apr-1981, 38 y.o.   MRN: [id number]  HPI  Presents to clinic for dog bite follow up. Was seen in ER on 02/08/19 for dog bite -- she was treated with course of Augmentin and Bactrim; wound was cleaned and laceration was sutured.  Her mother is a Marine scientist and removed the sutures at day 7.  She finished the Augmentin and Bactrim course.  And wound area seems to have scabbed over.  Patient states her mom showed a picture of the wound to someone she works with and they had suggested she see surgery for evaluation to see if debridement was needed.  Patient attempted to get referral to surgery from her GYN however states when they call the surgery office to see about appointment, were told that she might need wound care instead.  She has not yet seen surgery or a wound care specialist.   Area is not painful. No drainage. No fevers or chills.    Patient Active Problem List   Diagnosis Date Noted  . Indication for care in labor and delivery, antepartum 10/06/2016  . Indication for care in labor or delivery 10/06/2016  . Polyhydramnios affecting pregnancy 09/28/2016  . Obesity (BMI 30-39.9) 08/30/2012  . Depression 08/30/2012  . Migraine 08/30/2012   Social History   Tobacco Use  . Smoking status: Never Smoker  . Smokeless tobacco: Never Used  Substance Use Topics  . Alcohol use: No    Comment: Maybe once a month    Review of Systems  Constitutional: Negative for chills, fatigue and fever.  HENT: Negative for congestion, ear pain, sinus pain and sore throat.   Eyes: Negative.   Respiratory: Negative for cough, shortness of breath and wheezing.   Cardiovascular: Negative for chest pain, palpitations and leg swelling.  Gastrointestinal: Negative for abdominal pain, diarrhea, nausea and vomiting.  Genitourinary: Negative for dysuria, frequency and urgency.  Musculoskeletal: Negative for arthralgias and myalgias.  Skin:  Negative for color change, pallor and rash.  Neurological: Negative for syncope, light-headedness and headaches.  Psychiatric/Behavioral: The patient is not nervous/anxious.       Objective:   Physical Exam Constitutional:      General: She is not in acute distress.    Appearance: She is not ill-appearing, toxic-appearing or diaphoretic.  HENT:     Head: Normocephalic and atraumatic.  Cardiovascular:     Rate and Rhythm: Normal rate and regular rhythm.     Heart sounds: Normal heart sounds.  Pulmonary:     Effort: Pulmonary effort is normal. No respiratory distress.     Breath sounds: Normal breath sounds.  Skin:    General: Skin is warm and dry.          Comments: Location of dog bite/wound healing indicated by red mark on diagram. Appears to have scabbed over, ?eschar tissues that needs debridement.  Does not appear infected, no surrounding redness.  Neurological:     Mental Status: She is alert and oriented to person, place, and time.     Today's Vitals   02/27/19 0810  BP: 120/80  Pulse: 78  Temp: 98.8 F (37.1 C)  TempSrc: Oral  SpO2: 98%  Weight: 230 lb 3.2 oz (104.4 kg)  Height: 5\' 3"  (1.6 m)   Body mass index is 40.78 kg/m.     Assessment & Plan:    Dog bite - we will place urgent referral to general surgery for evaluation  of area to see if debridement of wound is required.  I do not believe patient needs another course of antibiotics, does not have surrounding redness or any other signs of infection at this time.  Advised patient that if the wound is debrided, she may end up on another antibiotic and she understands.  Patient also aware that it is possible once surgery sees her, they may end up recommending she sees wound care and patient also states this is fine she would just like a surgeon to take a look at the wound and better direct her in next step in the plan of care.  Patient aware she should hear something from either our office or surgeons office  today and if she does not to call and let us know.

## 2019-03-04 ENCOUNTER — Encounter: Payer: Self-pay | Admitting: Surgery

## 2019-03-04 ENCOUNTER — Ambulatory Visit: Payer: Self-pay | Admitting: Surgery

## 2019-03-04 ENCOUNTER — Other Ambulatory Visit: Payer: Self-pay

## 2019-03-04 VITALS — BP 110/66 | HR 72 | Temp 97.7°F | Ht 63.0 in | Wt 231.0 lb

## 2019-03-04 DIAGNOSIS — S31159A Open bite of abdominal wall, unspecified quadrant without penetration into peritoneal cavity, initial encounter: Secondary | ICD-10-CM

## 2019-03-04 DIAGNOSIS — W540XXA Bitten by dog, initial encounter: Secondary | ICD-10-CM

## 2019-03-04 MED ORDER — OXYCODONE HCL 5 MG PO TABS: 5.0000 mg | ORAL_TABLET | Freq: Four times a day (QID) | ORAL | 0 refills | Status: DC | PRN

## 2019-03-04 NOTE — Patient Instructions (Addendum)
The patient is aware to call back for any questions or new concerns.  Wound care Wet to dry dressing (saline) daily May shower

## 2019-03-04 NOTE — Progress Notes (Addendum)
03/04/2019  Reason for Visit:  Dog bite wound  Referring Provider:  Leanora CoverLauren Guse, Sue Hernandez;  Sue PlowmanMargaret Arnett, Sue Hernandez  History of Present Illness: Sue Hernandez is a 38 y.o. female presents for evaluation of a dog bite wound.  The patient reports that on 7/18, her neighbor's dog attacked her unprovoked and bit her in the mons pubis.  She reports that the bite did not tear her clothing, but it did tear through her skin and into the subcutaneous tissue creating two lacerations.  She went to the ED on 7/18 and her wound was cleaned and sutured closed in layers.  She reports that after this, eventually as the would was healing, she noted the area of skin between the lacerations was getting dusky.  Eventually the portion of skin between lacerations became a large eschar.  She saw Ms. Hernandez at her PCP's office and a surgery referral was made.  The patient reports that her wound just recently started draining some serous fluid and that the eschar or scab is separating from the medial skin edge, and that is causing her pain.  Denies any purulent drainage, worsening erythema of the wound, induration, fevers, or chills.  Past Medical History: Past Medical History:  Diagnosis Date  . Depression    Dr. Ardelle AntonWagoner  . Eating disorder    bulemia  . Migraines      Past Surgical History: Past Surgical History:  Procedure Laterality Date  . CESAREAN SECTION N/A 10/07/2016   Procedure: CESAREAN SECTION;  Surgeon: Christeen DouglasBethany Beasley, MD;  Location: [facility] ORS;  Service: Obstetrics;  Laterality: N/A;  . CHOLECYSTECTOMY  2009  . LAPAROSCOPIC GASTRIC BANDING  2009   Dr. Terrill MohrEanic  . LAPAROSCOPIC GASTRIC SLEEVE RESECTION  01/2013  . TONSILLECTOMY  2006  . UPPER GI ENDOSCOPY  2014   hiatial hernia, Dr. Smitty CordsBruce    Home Medications: Prior to Admission medications   Medication Sig Start Date End Date Taking? Authorizing Provider  B Complex Vitamins (B COMPLEX PO) Take by mouth.   Yes [provider]  BIOTIN PO Take by  mouth.   Yes [provider]  buPROPion (WELLBUTRIN XL) 150 MG 24 hr tablet Take by mouth. 11/12/18  Yes [provider]  escitalopram (LEXAPRO) 20 MG tablet TAKE 1 TABLET (20 MG TOTAL) BY MOUTH DAILY. 01/17/16  Yes Shelia MediaWalker, Jennifer A, MD  oxyCODONE (OXY IR/ROXICODONE) 5 MG immediate release tablet Take 1 tablet (5 mg total) by mouth every 6 (six) hours as needed for severe pain. 03/04/19   Henrene DodgePiscoya, Alera Quevedo, MD    Allergies: Allergies  Allergen Reactions  . Ibuprofen Swelling    Swelling of face and hands  . Derma Guard [Protective Adhesive Powder]     Dermabond - urticaria    Social History:  reports that she has never smoked. She has never used smokeless tobacco. She reports that she does not drink alcohol or use drugs.   Family History: Family History  Problem Relation Age of Onset  . Cancer Maternal Grandmother        lung  . Cancer Paternal Grandmother        Breast  . Heart disease Paternal Grandfather   . Cancer Paternal Grandfather        renal cancer went to his lungs  . Cancer Maternal Grandfather        lung    Review of Systems: Review of Systems  Constitutional: Negative for chills and fever.  HENT: Negative for hearing loss.   Respiratory:  Negative for shortness of breath.   Cardiovascular: Negative for chest pain.  Gastrointestinal: Negative for abdominal pain, nausea and vomiting.  Genitourinary: Negative for dysuria.  Musculoskeletal: Negative for myalgias.  Skin: Negative for rash.       Dog bite wound to mons pubis, with resulting eschar  Neurological: Negative for dizziness.  Psychiatric/Behavioral: Negative for depression.    Physical Exam BP 110/66   Pulse 72   Temp 97.7 F (36.5 C) (Temporal)   Ht 5\' 3"  (1.6 m)   Wt 231 lb (104.8 kg)   LMP 02/26/2019 (Exact Date)   SpO2 98%   BMI 40.92 kg/m  CONSTITUTIONAL: No acute distress HEENT:  Normocephalic, atraumatic, extraocular motion intact. NECK: Trachea is midline, and there  is no jugular venous distension.  RESPIRATORY:  Lungs are clear, and breath sounds are equal bilaterally. Normal respiratory effort without pathologic use of accessory muscles. CARDIOVASCULAR: Heart is regular without murmurs, gallops, or rubs. GI: The abdomen is soft, non-distended, non-tender.  MUSCULOSKELETAL:  Normal muscle strength and tone in all four extremities.  No peripheral edema or cyanosis. SKIN: The area of the right side of mons pubis has a large wound that consisted of two oblique lacerations which had been sutured closed.  The skin between the lacerations, a section of about 5 cm x 4 cm consists now of an eschar resulting from devascularized skin after her injury.  The medial edge of the eschar has separated from the skin about 5 mm, revealing normal appearing subcutaneous tissue.  There is no purulent drainage, erythema other than mild reaction at skin edges, or induration.   NEUROLOGIC:  Motor and sensation is grossly normal.  Cranial nerves are grossly intact. PSYCH:  Alert and oriented to person, place and time. Affect is normal.  Laboratory Analysis: No results found for this or any previous visit (from the past 24 hour(s)).  Imaging: No results found.  Assessment and Plan: This is a 38 y.o. female with a dog bite to the mons pubis, resulting is devascularized section of skin between lacerations, now with a large eschar.  Discussed with the patient that there is no evidence of infection.  However, with the eschar in place, this wound would take significantly longer to heal under the eschar sloughs off on its own.  At this point, the best course of action is to debride the eschar down to healthy subcutaneous tissue and allow for healing via secondary intention with daily dressing changes.  She is in agreement and is wiling to proceed.  Discussed the procedure at length including dressing changes.   Procedure Date:  03/04/2019  Pre-operative Diagnosis:  Dog bite wound    Post-operative Diagnosis:  Dog bite wound  Procedure:  Excisional sharp debridement of dog bite wound  Surgeon:  Melvyn Neth, MD  Anesthesia:  15 ml of 1% lidocaine with epi  Estimated Blood Loss:  2 ml  Specimens:  None  Complications:  None  Description of Procedure: The patient was correctly identified at bedside.  Appropriate time-outs were performed prior to procedure.  The patient's pubic area was prepped and draped in usual sterile fashion.  Local anesthetic was infused intradermally around the edges of the junction between eschar and skin.  Using a scalpel, the eschar was sharply excised.  The eschar measured approximately 5 cm x 4 cm.  Excision revealed healthy subcutaneous tissue with only minimal fat necrosis at the lateral edge.  This was sharply debrided as well.  The wound edges were  cleaned as well and any residual eschar at the edges was resected.  There was no purulence.  The wound was then irrigated and dressed with wet to dry gauze dressing, secured with tape.  The patient tolerated the procedure well and all sharps were appropriately disposed of at the end of the case.  --Instructed patient on wet to dry dressing changes daily and as needed to keep the wound clean and dry. --No antibiotics needed and no antibiotic ointments needed for the wound. --Follow up in 1 week to assess healing. --Prescription for oxycodone given for pain control.  She may also take Tylenol.  She is allergic to Ibuprofen.   Howie IllJose Luis Addilynne Olheiser, MD [facility]

## 2019-03-11 ENCOUNTER — Encounter: Payer: Self-pay | Admitting: Surgery

## 2019-03-12 ENCOUNTER — Other Ambulatory Visit: Payer: Self-pay

## 2019-03-12 ENCOUNTER — Encounter: Payer: Self-pay | Admitting: Surgery

## 2019-03-12 ENCOUNTER — Ambulatory Visit (INDEPENDENT_AMBULATORY_CARE_PROVIDER_SITE_OTHER): Payer: Self-pay | Admitting: Surgery

## 2019-03-12 VITALS — BP 98/63 | HR 81 | Temp 97.2°F | Resp 16 | Ht 63.0 in | Wt 229.0 lb

## 2019-03-12 DIAGNOSIS — W540XXA Bitten by dog, initial encounter: Secondary | ICD-10-CM

## 2019-03-12 DIAGNOSIS — S31159A Open bite of abdominal wall, unspecified quadrant without penetration into peritoneal cavity, initial encounter: Secondary | ICD-10-CM

## 2019-03-12 DIAGNOSIS — Z09 Encounter for follow-up examination after completed treatment for conditions other than malignant neoplasm: Secondary | ICD-10-CM

## 2019-03-12 NOTE — Progress Notes (Signed)
03/12/2019  HPI: ANGLIA Hernandez is a 38 y.o. female s/p debridement of eschar from dog bite wound in the mons pubis.  Presents today for wound check.  Denies any worsening pain, and reports her husband helps her with the dressing changes.  No issues with dressings.  Vital signs: BP 98/63   Pulse 81   Temp (!) 97.2 F (36.2 C) (Temporal)   Resp 16   Ht 5\' 3"  (1.6 m)   Wt 229 lb (103.9 kg)   LMP 02/26/2019 (Exact Date)   SpO2 97%   BMI 40.57 kg/m    Physical Exam: Constitutional: No acute distress Skin:  Dog bite wound at the mons pubis is healing well, with healthy granulation tissue at the wound base, without any fibrinous or necrotic material or purulent drainage.  No erythema or induration.  Wound measures approximately 5 cm x 3 cm.  Wet to dry dressing reapplied.  Assessment/Plan: This is a 38 y.o. female s/p dog bite wound to the mons pubis  Discussed with the patient that the wound is healing well and the wound bed is very healthy.  Continue daily dressing changes.  Encouraged increased protein and MVI to help with wound healing.    Follow up in 2 weeks for wound check.   Melvyn Neth, [facility]

## 2019-03-12 NOTE — Patient Instructions (Signed)
We see you back in 2 weeks. Continue wet to dry dressing changes. Increase protein intake and take a multivitamin to aid in healing.

## 2019-03-25 ENCOUNTER — Encounter: Payer: Self-pay | Admitting: Physician Assistant

## 2019-03-25 ENCOUNTER — Other Ambulatory Visit: Payer: Self-pay

## 2019-03-25 ENCOUNTER — Ambulatory Visit (INDEPENDENT_AMBULATORY_CARE_PROVIDER_SITE_OTHER): Payer: Self-pay | Admitting: Physician Assistant

## 2019-03-25 VITALS — BP 120/76 | HR 83 | Temp 95.2°F | Ht 63.0 in | Wt 228.2 lb

## 2019-03-25 DIAGNOSIS — W540XXA Bitten by dog, initial encounter: Secondary | ICD-10-CM

## 2019-03-25 DIAGNOSIS — S31159A Open bite of abdominal wall, unspecified quadrant without penetration into peritoneal cavity, initial encounter: Secondary | ICD-10-CM

## 2019-03-25 NOTE — Progress Notes (Signed)
[facility] POST-OP OFFICE VISIT  03/25/2019  HPI: Sue Hernandez is a 38 y.o. female 3 weeks s/p debridement of dog bite wound to the mons pubis with Dr Hampton Abbot. She has been healing well. Now with a small 1 x 2 cm area of granulation tissue, no longer requiring packing, no issues with pain. No other acute concerns.   Vital signs: LMP 02/26/2019 (Exact Date)    Physical Exam: Constitutional: Well appearing female, NAD Skin: The is an approximately 1 x 2 cm triangle shaped area of beefy red granulation tissue over the mons pubis, well healing scars around this wound, no erythema, no drainage.   Assessment/Plan: This is a 38 y.o. female 3 weeks s/p debridement of dog bite wound to the mons pubis   - pain control prn  - dry dressing prn  - monitor drainage  - rtc in 2 weeks for re-check of wound  -- Edison Simon, PA-C [facility] 03/25/2019, 9:13 AM [phone] M-F: 7am - 4pm

## 2019-03-25 NOTE — Patient Instructions (Signed)
Please place dry gauze over the area and change daily. Please see your follow up appointment listed below.

## 2019-04-08 ENCOUNTER — Other Ambulatory Visit: Payer: Self-pay

## 2019-04-08 ENCOUNTER — Ambulatory Visit (INDEPENDENT_AMBULATORY_CARE_PROVIDER_SITE_OTHER): Payer: Self-pay | Admitting: Surgery

## 2019-04-08 ENCOUNTER — Encounter: Payer: Self-pay | Admitting: Surgery

## 2019-04-08 VITALS — BP 133/82 | HR 98 | Temp 97.5°F | Ht 63.0 in | Wt 227.0 lb

## 2019-04-08 DIAGNOSIS — S31159A Open bite of abdominal wall, unspecified quadrant without penetration into peritoneal cavity, initial encounter: Secondary | ICD-10-CM

## 2019-04-08 DIAGNOSIS — W540XXA Bitten by dog, initial encounter: Secondary | ICD-10-CM

## 2019-04-08 NOTE — Patient Instructions (Signed)
Please call if you have questions or concerns. Apply vitamin E ointment. Lotion or break the  capsule to the area to help with scarring.

## 2019-04-08 NOTE — Progress Notes (Signed)
04/08/2019  HPI: Sue Hernandez is a 38 y.o. female s/p dog bite wound to the mons pubis, requiring debridement on 03/04/19.  She presents today for follow up.  Reports that she stopped needing any gauze dressing three days ago.  Denies any wound issues or drainage.  Vital signs: BP 133/82   Pulse 98   Temp (!) 97.5 F (36.4 C) (Skin)   Ht 5\' 3"  (1.6 m)   Wt 227 lb (103 kg)   SpO2 96%   BMI 40.21 kg/m    Physical Exam: Constitutional: No acute distress Skin:  Fully healed wound at the mons pubis.  There is a small area of scab from the most recent area of healing/closure.  No drainage.  No dressing applied.  Assessment/Plan: This is a 38 y.o. female s/p debridement of dog bite wound  --Wound is fully healed now.   --Recommended that she may apply vitamin E ointment to the wound to help with the scarring.  She can keep the wound covered for now as it is still somewhat weak so that friction from clothing does not reopen it.  --She may submerge wound and go swimming --Follow up prn.   Melvyn Neth, [facility]

## 2019-08-29 LAB — OB RESULTS CONSOLE VARICELLA ZOSTER ANTIBODY, IGG: Varicella: IMMUNE

## 2019-08-29 LAB — OB RESULTS CONSOLE GC/CHLAMYDIA
Chlamydia: NEGATIVE
Gonorrhea: NEGATIVE

## 2019-08-29 LAB — OB RESULTS CONSOLE RUBELLA ANTIBODY, IGM: Rubella: IMMUNE

## 2019-10-23 ENCOUNTER — Other Ambulatory Visit: Payer: Self-pay

## 2019-10-23 ENCOUNTER — Encounter: Payer: Self-pay | Admitting: Physical Therapy

## 2019-10-23 ENCOUNTER — Ambulatory Visit: Payer: BC Managed Care – PPO | Attending: Obstetrics and Gynecology | Admitting: Physical Therapy

## 2019-10-23 DIAGNOSIS — M6281 Muscle weakness (generalized): Secondary | ICD-10-CM

## 2019-10-23 DIAGNOSIS — R278 Other lack of coordination: Secondary | ICD-10-CM | POA: Diagnosis present

## 2019-10-23 NOTE — Patient Instructions (Addendum)
Minimize forward bending:   _House cleaning mechanics  ( handout)   _Small lunge position , knees bent _Standing 45 deg to sink/ crib  _Mini squat/ 45 deg lunge for bending    _Getting out of the chair: Scoot to front part of chair chair Heels behind feet, feet are hip width apart, nose over toes  hands at hips  ( like in squats)  Inhale like you are smelling roses Exhale to stand   _sitting with feet flat on the floor  _sitting on the floor with buttocks lifted up by folded think blanket        Transition from standing to floor :  stand to floor transfer :      _ slow     _ mini squat      _ prop elbows on chair/ couch      _lower knees to ground       Rolling in bed with elbows and feet down to scoot , not lifting head.  ___       Minisquat: Scoot buttocks back slight, hinge like you are looking at your reflection on a pond  Knees behind toes,  Inhale to "smell flowers"  Exhale on the rise "like rocket"  Do not lock knees, have more weight across ballmounds of feet, toes relaxed   10 reps x 3 x day   ___  Minisquat + side step    ____

## 2019-10-24 NOTE — Therapy (Signed)
[facility] MAIN [facility] SERVICES [address] [place], [place], [address] Phone: [phone]   Fax:  [phone]  Physical Therapy Evaluation  Patient Details  Name: Sue Hernandez MRN: [id number] Date of Birth: 11/08/1980 Referring Provider (PT): Dalbert Garnet   Encounter Date: 10/23/2019  PT End of Session - 10/23/19 1005    Visit Number  1    Number of Visits  10    Date for PT Re-Evaluation  01/01/20    PT Start Time  0910    PT Stop Time  1005    PT Time Calculation (min)  55 min    Activity Tolerance  Patient tolerated treatment well    Behavior During Therapy  [facility] for tasks assessed/performed       Past Medical History:  Diagnosis Date  . Depression    Dr. Ardelle Anton  . Eating disorder    bulemia  . Migraines     Past Surgical History:  Procedure Laterality Date  . CESAREAN SECTION N/A 10/07/2016   Procedure: CESAREAN SECTION;  Surgeon: Christeen Douglas, MD;  Location: [facility] ORS;  Service: Obstetrics;  Laterality: N/A;  . CHOLECYSTECTOMY  2009  . LAPAROSCOPIC GASTRIC BANDING  2009   Dr. Terrill Mohr  . LAPAROSCOPIC GASTRIC SLEEVE RESECTION  01/2013  . TONSILLECTOMY  2006  . UPPER GI ENDOSCOPY  2014   hiatial hernia, Dr. Smitty Cords    There were no vitals filed for this visit.   Subjective Assessment - 10/23/19 0915    Subjective 1) pelvic pain: Pt experiences pain at her belly button to between her legs that started in 15 weeks of her 2nd pregnancy. Pt is currently [redacted] weeks pregnant. This pain comes on with being on her feet for 1 hours after cooking and cleaning, walking for 40 min, getting from laying down on the couch to standing. Average 8/10 pain with these activities.  At its best, 2/10.   This issue did not have occur with her first pregnancy.   2)  incomplete emptying of urine:  within the past 2 months, pt noticed that she will have dribbling even after she has finished wiping. Pt is concerned abotu that.    3) SUI with laughing and  sneezing.  Denied straining with bowel movements.  BMs occur every other day.  Pt is trying to increase water intake daily ( 40 lb total per day).   4)  L hip pain at the side and groin:  L hip side pain that radiates down her outer leg when lyin gon L side. Pt sleeps with a pillow between knees.      Pertinent History  Pt currently see a chriopractor and will be seeing her every other week. Pt feels 75% better after chiropractor and this relief lasts for one week and not painfree in the evening for sleep and rest after activities.    Gyn Hx: C-section with 1st child ( 55 years old  -40 lb). physical activities in past: boot camp classes 3 x week for one year 2019.  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 Pt returned to fitness one year after 1st pregnancy. pt used to do crunches 100 reps with the trainer or on her own. 10 sit ups in boot camps.    Limitations  Standing;Walking;House hold activities         [facility] PT Assessment - 10/24/19 1122      Assessment   Medical Diagnosis  pelvic and perineal  pain     Referring Provider (PT)  Leafy Ro      Precautions   Precautions  None      Restrictions   Weight Bearing Restrictions  No      Balance Screen   Has the patient fallen in the past 6 months  No      Observation/Other Assessments   Observations  ankles crossed       Coordination   Gross Motor Movements are Fluid and Coordinated  --   chest breathing      Squat   Comments  knee anterior to toes,weightbearing in heels        Floor to Stand   Comments  poor pelvic stability       Strength   Overall Strength Comments  BLE 5/5 , hip abd 3/5 B                 Objective measurements completed on examination: See above findings.    Pelvic Floor Special Questions - 10/24/19 1122    External Perineal Exam  mons  pubis :  severely restricted scar from a dog bite .  suprapubic scar with restrictions medial and lateral borders B        [facility]  Adult PT Treatment/Exercise - 10/24/19 1241      Therapeutic Activites    Therapeutic Activities  Other Therapeutic Activities    Other Therapeutic Activities  body mechanics to minimize straining abdominopelvic area in household chores, baby handling , t/f       Neuro Re-ed    Neuro Re-ed Details   cued for alignment for more pelvic stability/ exhale to coordinate deep core                   PT Long Term Goals - 10/23/19 0930      PT LONG TERM GOAL #1   Title  Pt will report decreased pain by 50% at the end of the day after typical activities and to be able to sleep on her L side with improved without interruption of pain    Time  6    Period  Weeks    Status  New    Target Date  12/05/19      PT LONG TERM GOAL #2   Title  Pt will report on FOTO "once or less than a week"  compared to " More than once a week"  for the question for "How often does urine leak for no obvious reasonwhen you are awake?"    Time  10    Period  Weeks    Status  New    Target Date  01/02/20      PT LONG TERM GOAL #3   Title  Pt will demo proper body mechanics to minimize straining of abdomen and pelvic floor with fitness routine    Time  2    Period  Weeks    Status  New    Target Date  11/07/19      PT LONG TERM GOAL #4   Title  Pt will demo  proper deep core coordination without chest breathing and optimal excursion of diaphragm/pelvic floor    Time  4    Period  Weeks    Status  New    Target Date  11/21/19      PT LONG TERM GOAL #5   Title  Pt will demo IND with pelvic floor exercises in sdelying and seated positions to minimize risk  for urinary incontinence    Time  8    Period  Weeks    Status  New    Target Date  12/19/19             Plan - 10/24/19 1143    Clinical Impression Statement  Pt is a 39  yo female who is [redacted] weeks pregnant with her 2nd child who presents with pelvic floor dysfunctions with pelvic pain, incomplete emptying of urine, SUI, and L hip pain.  Pt's musculoskeletal assessment revealed  dyscoordination and strength of pelvic floor mm, weak hip weakness, poor body mechanics which places strain on the abdominal/pelvic floor mm, and severely restricted scar at mons pubis and mildly restricted scar at suprapubic area.  These are deficits that indicate an ineffective intraabdominal pressure system associated with pt's Sx.   Pt performs many gravity-loaded tasks at home with carrying, pushing toddler. Pt will benefit from proper coordination training and education on fitness and functional positions in order to yield greater outcomes as pt performed sit-ups/ crunches in the past.  Advised pt to not perform sit-ups and crunches as these movement patterns lead to more downward forces on her pelvic floor, negatively impacting abdominopelvic/spinal dysfunctions.   Pt will benefit from coordination training and education on fitness and functional positions in order to gain a more effective intraabdominal pressure system to minimize urinary leakage.  Pt was provided education on etiology of Sx with anatomy, physiology explanation with images along with the benefits of customized pelvic PT Tx based on pt's medical conditions and musculoskeletal deficits.  Explained the physiology of deep core mm coordination and roles of pelvic floor function in urination, defecation, sexual function, and postural control with deep core mm system.   Following Tx today which pt demo'd proper techniques the t/f from floor <> stand, log rolling, household chores, and caring for toddler.   Pt benefits from skilled PT      Stability/Clinical Decision Making  Evolving/Moderate complexity    Clinical Decision Making  Moderate    Rehab Potential  Good    PT Frequency  1x / week    PT Duration  Other (comment)   10   PT Treatment/Interventions  Moist Heat;Therapeutic exercise;Therapeutic activities;Functional mobility training;Stair training;Neuromuscular re-education;Scar  mobilization;Patient/family education;Energy conservation;Manual techniques    Consulted and Agree with Plan of Care  Patient       Patient will benefit from skilled therapeutic intervention in order to improve the following deficits and impairments:  Hypomobility, Improper body mechanics, Decreased strength, Postural dysfunction, Pain, Increased muscle spasms, Difficulty walking, Decreased coordination, Decreased endurance, Decreased mobility, Decreased scar mobility  Visit Diagnosis: Other lack of coordination  Muscle weakness (generalized)     Problem List Patient Active Problem List   Diagnosis Date Noted  . Indication for care in labor and delivery, antepartum 10/06/2016  . Indication for care in labor or delivery 10/06/2016  . Polyhydramnios affecting pregnancy 09/28/2016  . Obesity (BMI 30-39.9) 08/30/2012  . Depression 08/30/2012  . Migraine 08/30/2012    Mariane Masters ,PT, DPT, E-RYT  10/24/2019, 12:49 PM  [facility] MAIN [facility] SERVICES [address] [place], [place], [address] Phone: [phone]   Fax:  [phone]  Name: SHANELLE CLONTZ MRN: [id number] Date of Birth: [birth year]

## 2019-10-29 ENCOUNTER — Ambulatory Visit: Payer: BC Managed Care – PPO | Admitting: Physical Therapy

## 2019-11-13 ENCOUNTER — Other Ambulatory Visit: Payer: Self-pay

## 2019-11-13 ENCOUNTER — Ambulatory Visit: Payer: BC Managed Care – PPO | Admitting: Physical Therapy

## 2019-11-13 DIAGNOSIS — R278 Other lack of coordination: Secondary | ICD-10-CM

## 2019-11-13 DIAGNOSIS — M6281 Muscle weakness (generalized): Secondary | ICD-10-CM

## 2019-11-13 NOTE — Patient Instructions (Addendum)
Lengthen Back rib by _ shoulder    Lie on   side , pillow between knees  Pull  arm overhead over mattress, grab the edge of mattress,pull it upward, drawing elbow away from ears  Breathing 10 reps  Open book (handout)  Lying on  _ side , rotating  __ only this week  10 reps   ____  Semi reclined on back, knees bent    band under ballmounds  while laying on back w/ knees bent  "W" exercise  10 reps x 2 sets    Band is placed under feet, knees bent, feet are hip width apart Hold band with thumbs point out, keep upper arm and elbow touching the bed the whole time  - inhale and then exhale pull bands by bending elbows hands move in a "w"  (feel shoulder blades squeezing)    ______________  Deep core 1-2 ( handout)  Semi reclined  Get up with hands pushing not with crunch   _____________    Wear bra extensor, strap 1 inch above elbow, shoulder straps tighter   Wear the SIJ you received all times  dont wait till end of day

## 2019-11-14 NOTE — Therapy (Signed)
[place] MAIN [facility] SERVICES [address] [place], [place], [address] Phone: [phone]   Fax:  [phone]  Physical Therapy Treatment  Patient Details  Name: Sue Hernandez MRN: [id number] Date of Birth: 1981-07-23 Referring Provider (PT): Leafy Ro   Encounter Date: 11/13/2019  PT End of Session - 11/13/19 9024    Visit Number  2    Number of Visits  10    Date for PT Re-Evaluation  01/01/20   Eval 10/23/19   PT Start Time  1304    PT Stop Time  1400    PT Time Calculation (min)  56 min    Activity Tolerance  Patient tolerated treatment well    Behavior During Therapy  [facility] for tasks assessed/performed       Past Medical History:  Diagnosis Date  . Depression    Dr. Jacqualyn Posey  . Eating disorder    bulemia  . Migraines     Past Surgical History:  Procedure Laterality Date  . CESAREAN SECTION N/A 10/07/2016   Procedure: CESAREAN SECTION;  Surgeon: Benjaman Kindler, MD;  Location: [facility] ORS;  Service: Obstetrics;  Laterality: N/A;  . CHOLECYSTECTOMY  2009  . LAPAROSCOPIC GASTRIC BANDING  2009   Dr. Johny Drilling  . LAPAROSCOPIC GASTRIC SLEEVE RESECTION  01/2013  . TONSILLECTOMY  2006  . UPPER GI ENDOSCOPY  2014   hiatial hernia, Dr. Darnell Level    There were no vitals filed for this visit.  Subjective Assessment - 11/13/19 1311    Subjective  Pt reported she has been practicing the new technique with getting down to the floor.  Pt has been sleeping through the night with new medications ( Flexiril). Pain is not as consistent like it used to be everyday. When it comes on, it is in the evening.    Pertinent History  Pt currently see a chriopractor and will be seeing her every other week. Pt feels 75% better after chiropractor and this relief lasts for one week and not painfree in the evening for sleep and rest after activities.    Gyn Hx: C-section with 1st child ( 39 years old  -40 lb). physical activities in past: boot camp classes 3 x week for  one year 2019.  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 Pt returned to fitness one year after 1st pregnancy. pt used to do crunches 100 reps with the trainer or on her own. 10 sit ups in boot camps. 2007, pt broke her tailbone after falling down the stairs and she now can not sit for a long time. 2017, pt had shooting pain for LBP and then had decompression therapy.    Limitations  Standing;Walking;House hold activities         [facility] PT Assessment - 11/13/19 1312      Observation/Other Assessments   Observations  bra donned too tight      Coordination   Gross Motor Movements are Fluid and Coordinated  --   upper trap overuse w/ inhalation , limited diaphragm excursi     Strength   Overall Strength Comments  B hip abd 4+/5       Palpation   Spinal mobility  hypomobility at thoracic region, teres minor, intercostals 5-7 ( improved post Tx)                    [facility] Adult PT Treatment/Exercise - 11/13/19 1341      Neuro Re-ed  Neuro Re-ed Details   cued for stretches, new HEP and deep core levle 1-2       Manual Therapy   Manual therapy comments  STM/MWM at thoracic region, teres minor, intercostals 5-7                  PT Long Term Goals - 10/23/19 0930      PT LONG TERM GOAL #1   Title  Pt will report decreased pain by 50% at the end of the day after typical activities and to be able to sleep on her L side with improved without interruption of pain    Time  6    Period  Weeks    Status  New    Target Date  12/05/19      PT LONG TERM GOAL #2   Title  Pt will report on FOTO "once or less than a week"  compared to " More than once a week"  for the question for "How often does urine leak for no obvious reasonwhen you are awake?"    Time  10    Period  Weeks    Status  New    Target Date  01/02/20      PT LONG TERM GOAL #3   Title  Pt will demo proper body mechanics to minimize straining of abdomen and pelvic floor with  fitness routine    Time  2    Period  Weeks    Status  New    Target Date  11/07/19      PT LONG TERM GOAL #4   Title  Pt will demo  proper deep core coordination without chest breathing and optimal excursion of diaphragm/pelvic floor    Time  4    Period  Weeks    Status  New    Target Date  11/21/19      PT LONG TERM GOAL #5   Title  Pt will demo IND with pelvic floor exercises in sdelying and seated positions to minimize risk for urinary incontinence    Time  8    Period  Weeks    Status  New    Target Date  12/19/19            Plan - 11/14/19 0153    Clinical Impression Statement Pt's hip abduction strength is increasing which will provide more pelvic girdle stability.  Pt required manual Tx today to increase thoracic mobility which helped to improve diaphragmatic, TrA, and pelvic floor coordination. This helped pt to progress to deep core coordination which will help with the lower abdomen pain as trimester progressed. SIJ belt was provided and donned. Pt reported it was more comfortable than her own which was thicker and uncomfortable when sitting. Anticipate SIJ belt and  strengthening exercises will help improve pt's Sx. Pt continues to benefit from skilled PT.   Stability/Clinical Decision Making  Evolving/Moderate complexity    Rehab Potential  Good    PT Frequency  1x / week    PT Duration  Other (comment)   10   PT Treatment/Interventions  Moist Heat;Therapeutic exercise;Therapeutic activities;Functional mobility training;Stair training;Neuromuscular re-education;Scar mobilization;Patient/family education;Energy conservation;Manual techniques    Consulted and Agree with Plan of Care  Patient       Patient will benefit from skilled therapeutic intervention in order to improve the following deficits and impairments:  Hypomobility, Improper body mechanics, Decreased strength, Postural dysfunction, Pain, Increased muscle spasms, Difficulty walking, Decreased  coordination, Decreased endurance,  Decreased mobility, Decreased scar mobility  Visit Diagnosis: Other lack of coordination  Muscle weakness (generalized)     Problem List Patient Active Problem List   Diagnosis Date Noted  . Indication for care in labor and delivery, antepartum 10/06/2016  . Indication for care in labor or delivery 10/06/2016  . Polyhydramnios affecting pregnancy 09/28/2016  . Obesity (BMI 30-39.9) 08/30/2012  . Depression 08/30/2012  . Migraine 08/30/2012    Mariane Masters ,PT, DPT, E-RYT  11/14/2019, 1:55 AM  [place] [facility] MAIN [facility] SERVICES [address] [place], [place], [address] Phone: [phone]   Fax:  [phone]  Name: Sue Hernandez MRN: [id number] Date of Birth: [birth year]

## 2019-11-18 ENCOUNTER — Ambulatory Visit: Payer: BC Managed Care – PPO | Admitting: Physical Therapy

## 2019-11-27 ENCOUNTER — Other Ambulatory Visit: Payer: Self-pay

## 2019-11-27 ENCOUNTER — Ambulatory Visit: Payer: Medicaid Other | Attending: Obstetrics and Gynecology | Admitting: Physical Therapy

## 2019-11-27 DIAGNOSIS — R278 Other lack of coordination: Secondary | ICD-10-CM | POA: Diagnosis present

## 2019-11-27 DIAGNOSIS — M533 Sacrococcygeal disorders, not elsewhere classified: Secondary | ICD-10-CM | POA: Insufficient documentation

## 2019-11-27 DIAGNOSIS — M6281 Muscle weakness (generalized): Secondary | ICD-10-CM

## 2019-11-27 NOTE — Therapy (Signed)
[place] MAIN [facility] SERVICES [address] [place], [place], [address] Phone: [phone]   Fax:  [phone]  Physical Therapy Treatment  Patient Details  Name: Sue Hernandez MRN: [id number] Date of Birth: 11-Aug-1980 Referring Provider (PT): Leafy Ro   Encounter Date: 11/27/2019  PT End of Session - 11/27/19 6599    Visit Number  3    Number of Visits  10    Date for PT Re-Evaluation  01/01/20   Eval 10/23/19   PT Start Time  1004    PT Stop Time  1100    PT Time Calculation (min)  56 min    Activity Tolerance  Patient tolerated treatment well    Behavior During Therapy  [facility] for tasks assessed/performed       Past Medical History:  Diagnosis Date  . Depression    Dr. Jacqualyn Posey  . Eating disorder    bulemia  . Migraines     Past Surgical History:  Procedure Laterality Date  . CESAREAN SECTION N/A 10/07/2016   Procedure: CESAREAN SECTION;  Surgeon: Benjaman Kindler, MD;  Location: [facility] ORS;  Service: Obstetrics;  Laterality: N/A;  . CHOLECYSTECTOMY  2009  . LAPAROSCOPIC GASTRIC BANDING  2009   Dr. Johny Drilling  . LAPAROSCOPIC GASTRIC SLEEVE RESECTION  01/2013  . TONSILLECTOMY  2006  . UPPER GI ENDOSCOPY  2014   hiatial hernia, Dr. Darnell Level    There were no vitals filed for this visit.  Subjective Assessment - 11/27/19 0906    Subjective  Pt is less uncomfrotable on most evenings.    Pertinent History  Pt currently see a chriopractor and will be seeing her every other week. Pt feels 75% better after chiropractor and this relief lasts for one week and not painfree in the evening for sleep and rest after activities.    Gyn Hx: C-section with 1st child ( 27 years old  -40 lb). physical activities in past: boot camp classes 3 x week for one year 2019.  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 Pt returned to fitness one year after 1st pregnancy. pt used to do crunches 100 reps with the trainer or on her own. 10 sit ups in  boot camps. 2007, pt broke her tailbone after falling down the stairs and she now can not sit for a long time. 2017, pt had shooting pain for LBP and then had decompression therapy.    Limitations  Standing;Walking;House hold activities         [facility] PT Assessment - 11/27/19 1222      Strength   Overall Strength Comments  B hip abd 3+/5       Palpation   Palpation comment  increasde tightness at R coccygeus by coccyx, L glut med, hypomobility at  SIJ lacking nutation                 Pelvic Floor Special Questions - 11/27/19 1230    External Palpation  quick contraction 1 sec, 10 reps in sidelying         [facility] Adult PT Treatment/Exercise - 11/27/19 1223      Neuro Re-ed    Neuro Re-ed Details   cued for pelvic tilt and clam shells and alignment in extended side angle       Manual Therapy   Manual therapy comments  long axis distraction, PA mob Grade II-III at sacrum to promote nutation, ext of coccyx, superior glide with coccyx /  coccgyeus R, rotational mob , STM/ MWM at glut med L                   PT Long Term Goals - 11/27/19 0906      PT LONG TERM GOAL #1   Title  Pt will report decreased pain by 50% at the end of the day after typical activities and to be able to sleep on her L side with improved without interruption of pain ( 11/27/19: 30% less )    Time  6    Period  Weeks    Status  Partially Met      PT LONG TERM GOAL #2   Title  Pt will report on FOTO "once or less than a week"  compared to " More than once a week"  for the question for "How often does urine leak for no obvious reasonwhen you are awake?"    Time  10    Period  Weeks    Status  On-going      PT LONG TERM GOAL #3   Title  Pt will demo proper body mechanics to minimize straining of abdomen and pelvic floor with fitness routine    Time  2    Period  Weeks    Status  On-going      PT LONG TERM GOAL #4   Title  Pt will demo  proper deep core coordination without chest  breathing and optimal excursion of diaphragm/pelvic floor    Time  4    Period  Weeks    Status  Achieved      PT LONG TERM GOAL #5   Title  Pt will demo IND with pelvic floor exercises in sdelying and seated positions to minimize risk for urinary incontinence    Time  8    Period  Weeks    Status  On-going            Plan - 11/27/19 1228    Clinical Impression Statement Pt progressed to quick pelvic floor contractions and hip abduction strengthening today. Pt required cues for technique.  Pt required manual Tx to promote coccyx extension, decrease coccygeus mm tightness, SIJ mobility. Plan to continue pubic scar restrictions next session. Pt continues to benefit from skilled PT    Stability/Clinical Decision Making  Evolving/Moderate complexity    Rehab Potential  Good    PT Frequency  1x / week    PT Duration  Other (comment)   10   PT Treatment/Interventions  Moist Heat;Therapeutic exercise;Therapeutic activities;Functional mobility training;Stair training;Neuromuscular re-education;Scar mobilization;Patient/family education;Energy conservation;Manual techniques    Consulted and Agree with Plan of Care  Patient       Patient will benefit from skilled therapeutic intervention in order to improve the following deficits and impairments:  Hypomobility, Improper body mechanics, Decreased strength, Postural dysfunction, Pain, Increased muscle spasms, Difficulty walking, Decreased coordination, Decreased endurance, Decreased mobility, Decreased scar mobility  Visit Diagnosis: Other lack of coordination  Muscle weakness (generalized)     Problem List Patient Active Problem List   Diagnosis Date Noted  . Indication for care in labor and delivery, antepartum 10/06/2016  . Indication for care in labor or delivery 10/06/2016  . Polyhydramnios affecting pregnancy 09/28/2016  . Obesity (BMI 30-39.9) 08/30/2012  . Depression 08/30/2012  . Migraine 08/30/2012    Jerl Mina  ,PT, DPT, E-RYT  11/27/2019, 12:31 PM  [place] MAIN [facility] SERVICES [place], [place],  [place] Phone: [phone]   Fax:  [phone]  Name: Sue Hernandez MRN: [id number] Date of Birth: January 03, 1981

## 2019-11-27 NOTE — Patient Instructions (Addendum)
  Clam Shell 45 Degrees   Lying with hips and knees bent 45, one pillow between knees and ankles. Lift knee with exhale. Be sure pelvis does not roll backward. Do not arch back. Do 20 times, each leg, 2 times per day.     Complimentary stretch: Figure-4  Seated  3 breaths  * Keep pelvis levelled with tactile cue with hand under back of hips  * Slide the ankle of the supporting foot out to decrease the angle which can help level the pelvis   ________    Extended side angle :  Feet are hip width apart, L foot one behind like you are on ski tracks,  R knee bent over ankle but not more forward then the ankle.  Make sure 50% weight is in the front foot/leg , 50% weight is the back foot/ leg    Rest R forearm lightly on top of thigh,  L hand on L hip.  Inhale lengthen spine,   Exhale turn navel to the L then the ribcage turns, look at the other wall.  Keep maintaining  50% weight is in the front foot/leg , 50% weight is the back foot/ leg  And make sure the front knee is still pointed in the toe line of the 2nd toe.   3 breaths here.    ___  Add 10 quick pelvic floor squeeze into deep core level 1   Pelvic floor squeeze set of 10 , 3 different times a day , sidelying

## 2019-12-04 ENCOUNTER — Ambulatory Visit: Payer: Medicaid Other | Admitting: Physical Therapy

## 2019-12-04 ENCOUNTER — Other Ambulatory Visit: Payer: Self-pay

## 2019-12-04 DIAGNOSIS — R278 Other lack of coordination: Secondary | ICD-10-CM

## 2019-12-04 DIAGNOSIS — M6281 Muscle weakness (generalized): Secondary | ICD-10-CM

## 2019-12-04 NOTE — Therapy (Signed)
[place] MAIN [facility] SERVICES [address] [place], [place], [address] Phone: [phone]   Fax:  [phone]  Physical Therapy Treatment  Patient Details  Name: Sue Hernandez MRN: [id number] Date of Birth: 1981/01/27 Referring Provider (PT): Leafy Ro   Encounter Date: 12/04/2019  PT End of Session - 12/04/19 1003    Visit Number  4    Number of Visits  10    Date for PT Re-Evaluation  01/01/20   Eval 10/23/19   PT Start Time  0908    PT Stop Time  1008    PT Time Calculation (min)  60 min    Activity Tolerance  Patient tolerated treatment well    Behavior During Therapy  [facility] for tasks assessed/performed       Past Medical History:  Diagnosis Date  . Depression    Dr. Jacqualyn Posey  . Eating disorder    bulemia  . Migraines     Past Surgical History:  Procedure Laterality Date  . CESAREAN SECTION N/A 10/07/2016   Procedure: CESAREAN SECTION;  Surgeon: Benjaman Kindler, MD;  Location: [facility] ORS;  Service: Obstetrics;  Laterality: N/A;  . CHOLECYSTECTOMY  2009  . LAPAROSCOPIC GASTRIC BANDING  2009   Dr. Johny Drilling  . LAPAROSCOPIC GASTRIC SLEEVE RESECTION  01/2013  . TONSILLECTOMY  2006  . UPPER GI ENDOSCOPY  2014   hiatial hernia, Dr. Darnell Level    There were no vitals filed for this visit.  Subjective Assessment - 12/04/19 0912    Subjective  Pt felt some soreness from last session and it did not last long. Pt noticed the top sacrum area more this week.  Pt sleeps better and her mmrelaxaers are helping. Pt continues to wear her belt.    Pertinent History  Pt currently see a chriopractor and will be seeing her every other week. Pt feels 75% better after chiropractor and this relief lasts for one week and not painfree in the evening for sleep and rest after activities.    Gyn Hx: C-section with 1st child ( 62 years old  -40 lb). physical activities in past: boot camp classes 3 x week for one year 2019.  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 Pt returned to fitness one year after 1st pregnancy. pt used to do crunches 100 reps with the trainer or on her own. 10 sit ups in boot camps. 2007, pt broke her tailbone after falling down the stairs and she now can not sit for a long time. 2017, pt had shooting pain for LBP and then had decompression therapy.    Limitations  Standing;Walking;House hold activities         [facility] PT Assessment - 12/04/19 0947      Coordination   Gross Motor Movements are Fluid and Coordinated  --   poor dissoassciation between trunk/ BLE in multifidis twist      Palpation   Palpation comment  increased tightness at QL B and hip abductor mm at SIJ B  , limited hip abd/ER at SIJ B      Strength: hip abd 4+/5  B              [facility] Adult PT Treatment/Exercise - 12/04/19 7824      Neuro Re-ed    Neuro Re-ed Details   cued for dissassociation of trunk , use of yoga strap for new stretches to lumbar lengthening , restorative yoga posture with bolster to continue  stretch posterior back mm while stretching pelvic floor mm     Manual Therapy   Manual therapy comments  long axis distraction, rotational mob to decrease QL/ hip abduction B                   PT Long Term Goals - 12/04/19 0954      PT LONG TERM GOAL #1   Title  Pt will report decreased pain by 50% at the end of the day after typical activities and to be able to sleep on her L side with improved without interruption of pain ( 11/27/19: 30% less , 5/13: with no pain , using mm relaxers as well) )    Time  6    Period  Weeks    Status  Achieved      PT LONG TERM GOAL #2   Title  Pt will report on FOTO "once or less than a week"  compared to " More than once a week"  for the question for "How often does urine leak for no obvious reasonwhen you are awake?"    Time  10    Period  Weeks    Status  On-going      PT LONG TERM GOAL #3   Title  Pt will demo proper body mechanics to minimize straining of  abdomen and pelvic floor with fitness routine    Time  2    Period  Weeks    Status  On-going      PT LONG TERM GOAL #4   Title  Pt will demo  proper deep core coordination without chest breathing and optimal excursion of diaphragm/pelvic floor    Time  4    Period  Weeks    Status  Achieved      PT LONG TERM GOAL #5   Title  Pt will demo IND with pelvic floor exercises in sdelying and seated positions to minimize risk for urinary incontinence    Time  8    Period  Weeks    Status  On-going            Plan - 12/04/19 1024    Clinical Impression Statement  Pt demo'd increased hip abduction strength today which is good carry over from last week. Pt required manual Tx to decrease QL / hip abductor mm tightness and increase SIJ mobility. Progressed pt to multifidis twist which pt requried excessive cues for dissassociation between trunk and pelvis.  Pt advanced to wall squats with cues to decrease hyperextension of knees. Pt continues to benefit from skilled PT    Stability/Clinical Decision Making  Evolving/Moderate complexity    Rehab Potential  Good    PT Frequency  1x / week    PT Duration  Other (comment)   10   PT Treatment/Interventions  Moist Heat;Therapeutic exercise;Therapeutic activities;Functional mobility training;Stair training;Neuromuscular re-education;Scar mobilization;Patient/family education;Energy conservation;Manual techniques    Consulted and Agree with Plan of Care  Patient       Patient will benefit from skilled therapeutic intervention in order to improve the following deficits and impairments:  Hypomobility, Improper body mechanics, Decreased strength, Postural dysfunction, Pain, Increased muscle spasms, Difficulty walking, Decreased coordination, Decreased endurance, Decreased mobility, Decreased scar mobility  Visit Diagnosis: Other lack of coordination  Muscle weakness (generalized)     Problem List Patient Active Problem List   Diagnosis Date  Noted  . Indication for care in labor and delivery, antepartum 10/06/2016  . Indication for care in  labor or delivery 10/06/2016  . Polyhydramnios affecting pregnancy 09/28/2016  . Obesity (BMI 30-39.9) 08/30/2012  . Depression 08/30/2012  . Migraine 08/30/2012    Sue Hernandez 12/04/2019, 10:30 AM  [place] [facility] MAIN [facility] SERVICES [address] [place], [place], [address] Phone: [phone]   Fax:  [phone]  Name: Sue Hernandez MRN: [id number] Date of Birth: 14-May-1981

## 2019-12-04 NOTE — Patient Instructions (Signed)
..  Stretches during the day :   kitchen counter stretches  Hands on kitchen counter,  Palms shoulder width apart  Minisquat postion Trunk is parallel to floor  A) Pull buttocks back to lengthen spine, knees bent  3 breaths  B) Bring R hand to the L, and stretch the R side trunk  3 breaths   Brings hands to center again Do the same to the L side stretch by placing L hand on top of R   D) Modified thread the needle R hand on L thigh, L  thigh pushing out slightly as the R hands pull in,  elbow bent and pulls to theR,  Look under L armpit   Do the same to other side __  Downward facing dog with belt at waist and hands on chair / ball Then stretch sides: palm on palm And then opp hand on thigh for a trunk rotation  ___  Stretches for hips front and back     1.Corner of a chair lunge with strap and back knee bends to stretch hips 2. Half V stretch on bed with strap with side bend, high five twist at the ribs, hug a ball motions    3.Side of hip stretch:  Reclined twist for hips and side of the hips/ legs  Lay on your back, knees bend Scoot hips to the R , leave shoulders in place Drop knees to the L side resting onto pillows to keep leg at the same width of hips Pillow under L thigh to minimize too much strain   Strap is under the top thigh and opp hand to bring knee closer              Strengthening:  Multifidis twist  Band is on doorknob: stand further away from door (facing perpendicular)   Twisting trunk without moving the hips and knees Hold band at the level of ribcage, elbows bent,shoulder blades roll back and down like squeezing a pencil under armpit    Exhale twist,.10-15 deg away from door without moving your hips/ knees. Continue to maintain equal weight through legs. Keep knee unlocked.  10 x 2   2. Wall squats: Feet two feet away from the wall Keep back of hips/ shoulders by the wall Inhale, lower hips tot he point where knees above  ankles, and not any further down where you can not see your toes  Exhale, rise up keeping back of hips and shoulders against wall  DO NOT LOCK KNEES< focus on ballmounds on rise   10 reps  X 3        ___  Walking with soft landing and less locked knees

## 2019-12-08 DIAGNOSIS — O99842 Bariatric surgery status complicating pregnancy, second trimester: Secondary | ICD-10-CM | POA: Diagnosis not present

## 2019-12-10 ENCOUNTER — Ambulatory Visit: Payer: Medicaid Other | Admitting: Physical Therapy

## 2019-12-11 DIAGNOSIS — Z3482 Encounter for supervision of other normal pregnancy, second trimester: Secondary | ICD-10-CM | POA: Diagnosis not present

## 2019-12-18 ENCOUNTER — Ambulatory Visit: Payer: Medicaid Other | Admitting: Physical Therapy

## 2019-12-18 ENCOUNTER — Other Ambulatory Visit: Payer: Self-pay

## 2019-12-18 DIAGNOSIS — M6281 Muscle weakness (generalized): Secondary | ICD-10-CM

## 2019-12-18 DIAGNOSIS — R278 Other lack of coordination: Secondary | ICD-10-CM

## 2019-12-18 NOTE — Patient Instructions (Addendum)
Pool exercise with toddler:  Leg circles out and in at the edge Standing leg circles with hand at edge , standing knee unlocked  Standing marches   Squat position, noodle push it down for lats    Stepping backwards , straddling bleue line, wide feet  Lower heel slow with lengthened back  Pulling toddler (noodle under arms, belly)  1 lap  Pulling toddle ( laying ontop of noodle under neck, back, buttocks, and he does angel wings, and scissor legs)  1 lap    Sidestepping, wide remains wide Pulling toddler (noodle under buttocks seated)  Hold him with elbows 90 deg , he faces away  1 lap   Walking forward with noodles waving , elbow 90 deg    Noodle under belly with kicks   Relaxation on noodles under neck, back, buttocks, Float    ___  Step into car butt first and move legs incrementally    ___  Lat pull down with squat position  Facing away  20 reps    Semi tandem stance facing the door  20 reps    ___  Adductor squeezes , gentle 5 sec, 10 reps , followed by adductor stretch    Abductor with red band , 20 reps, followed by figure -4 stretch   ___

## 2019-12-18 NOTE — Therapy (Signed)
[facility] [facility] MAIN [facility] SERVICES [address] [place], [place], [address] Phone: [phone]   Fax:  [phone]  Physical Therapy Treatment  Patient Details  Name: Sue Hernandez MRN: [id number] Date of Birth: 1981-04-12 Referring Provider (PT): Dalbert Garnet   Encounter Date: 12/18/2019  PT End of Session - 12/18/19 0958    Visit Number  5    Number of Visits  10    Date for PT Re-Evaluation  01/01/20   Eval 10/23/19   PT Start Time  0905    PT Stop Time  0945    PT Time Calculation (min)  40 min    Activity Tolerance  Patient tolerated treatment well    Behavior During Therapy  [facility] for tasks assessed/performed       Past Medical History:  Diagnosis Date  . Depression    Dr. Ardelle Anton  . Eating disorder    bulemia  . Migraines     Past Surgical History:  Procedure Laterality Date  . CESAREAN SECTION N/A 10/07/2016   Procedure: CESAREAN SECTION;  Surgeon: Christeen Douglas, MD;  Location: [facility] ORS;  Service: Obstetrics;  Laterality: N/A;  . CHOLECYSTECTOMY  2009  . LAPAROSCOPIC GASTRIC BANDING  2009   Dr. Terrill Mohr  . LAPAROSCOPIC GASTRIC SLEEVE RESECTION  01/2013  . TONSILLECTOMY  2006  . UPPER GI ENDOSCOPY  2014   hiatial hernia, Dr. Smitty Cords    There were no vitals filed for this visit.  Subjective Assessment - 12/18/19 0909    Subjective  Pt is having 3rd trimester nausea and managing ok.  Pain is better. Started being in the pool with toddler.  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 Pt has notice a feeling to her pubic area after she bent down to pick up a honeydew melon when it dropped from her cart.    Pertinent History  Pt currently see a chriopractor and will be seeing her every other week. Pt feels 75% better after chiropractor and this relief lasts for one week and not painfree in the evening for sleep and rest after activities.    Gyn Hx: C-section with 1st child ( 39 years old  -40 lb). physical activities in past: boot camp  classes 3 x week for one year 2019.  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 Pt returned to fitness one year after 1st pregnancy. pt used to do crunches 100 reps with the trainer or on her own. 10 sit ups in boot camps. 2007, pt broke her tailbone after falling down the stairs and she now can not sit for a long time. 2017, pt had shooting pain for LBP and then had decompression therapy.    Limitations  Standing;Walking;House hold activities         [facility] PT Assessment - 12/18/19 1002      Palpation   Palpation comment  tenderness with palpation at pubic symphysis, L higher than R                     [facility] Adult PT Treatment/Exercise - 12/18/19 1000      Therapeutic Activites    Other Therapeutic Activities  cued for t/f into car to minimize pubic symphysis separation       Neuro Re-ed    Neuro Re-ed Details   cued for new HEP technique and posterior tilt of pelvis to decrease lumbar lordosis        Manual Therapy  Manual therapy comments  long axis distraction, PA mob to promote downward rotation of L innominate to decrease pubic symphysis pain L                    PT Long Term Goals - 12/04/19 0954      PT LONG TERM GOAL #1   Title  Pt will report decreased pain by 50% at the end of the day after typical activities and to be able to sleep on her L side with improved without interruption of pain ( 11/27/19: 30% less , 5/13: with no pain , using mm relaxers as well) )    Time  6    Period  Weeks    Status  Achieved      PT LONG TERM GOAL #2   Title  Pt will report on FOTO "once or less than a week"  compared to " More than once a week"  for the question for "How often does urine leak for no obvious reasonwhen you are awake?"    Time  10    Period  Weeks    Status  On-going      PT LONG TERM GOAL #3   Title  Pt will demo proper body mechanics to minimize straining of abdomen and pelvic floor with fitness routine    Time  2     Period  Weeks    Status  On-going      PT LONG TERM GOAL #4   Title  Pt will demo  proper deep core coordination without chest breathing and optimal excursion of diaphragm/pelvic floor    Time  4    Period  Weeks    Status  Achieved      PT LONG TERM GOAL #5   Title  Pt will demo IND with pelvic floor exercises in sdelying and seated positions to minimize risk for urinary incontinence    Time  8    Period  Weeks    Status  On-going            Plan - 12/18/19 0959    Clinical Impression Statement Pt demo'd slight malalignment of her pubic symphysis which was corrected with manual Tx. Pt was educated on ways to strengthen adductor, abductor, posterior chain mm in the pool which she is enjoying with her toddler. Pt's abductor mm are getting stronger. Added lat strengthening as well with mini squats to prep pt with repeated bending and lifting new born baby. Pt required cues for less lumbar lordosis in new HEP.  Plan to address labor breathing techniques and other body mechanics before d/c which is planned for next visit.       Stability/Clinical Decision Making  Evolving/Moderate complexity    Rehab Potential  Good    PT Frequency  1x / week    PT Duration  Other (comment)   10   PT Treatment/Interventions  Moist Heat;Therapeutic exercise;Therapeutic activities;Functional mobility training;Stair training;Neuromuscular re-education;Scar mobilization;Patient/family education;Energy conservation;Manual techniques    Consulted and Agree with Plan of Care  Patient       Patient will benefit from skilled therapeutic intervention in order to improve the following deficits and impairments:  Hypomobility, Improper body mechanics, Decreased strength, Postural dysfunction, Pain, Increased muscle spasms, Difficulty walking, Decreased coordination, Decreased endurance, Decreased mobility, Decreased scar mobility  Visit Diagnosis: Other lack of coordination  Muscle weakness  (generalized)     Problem List Patient Active Problem List   Diagnosis Date Noted  .  Indication for care in labor and delivery, antepartum 10/06/2016  . Indication for care in labor or delivery 10/06/2016  . Polyhydramnios affecting pregnancy 09/28/2016  . Obesity (BMI 30-39.9) 08/30/2012  . Depression 08/30/2012  . Migraine 08/30/2012    Mariane Masters ,PT, DPT, E-RYT  12/18/2019, 10:51 AM  [facility] MAIN [facility] SERVICES [address] [place], [place], [address] Phone: [phone]   Fax:  [phone]  Name: ARRIONA PREST MRN: [id number] Date of Birth: 09-19-1980

## 2019-12-25 ENCOUNTER — Other Ambulatory Visit: Payer: Self-pay

## 2019-12-25 ENCOUNTER — Ambulatory Visit: Payer: Medicaid Other | Attending: Obstetrics and Gynecology | Admitting: Physical Therapy

## 2019-12-25 DIAGNOSIS — R278 Other lack of coordination: Secondary | ICD-10-CM

## 2019-12-25 DIAGNOSIS — M6281 Muscle weakness (generalized): Secondary | ICD-10-CM

## 2019-12-25 NOTE — Therapy (Signed)
[place] MAIN [facility] SERVICES [address] [place], [place], [address] Phone: [phone]   Fax:  [phone]  Physical Therapy Treatment [place] [facility] Note   Patient Details  Name: Sue Hernandez MRN: [id number] Date of Birth: 10-21-1980 Referring Provider (PT): Leafy Ro   Encounter Date: 12/25/2019  PT End of Session - 12/25/19 0930    Visit Number  6    Number of Visits  10    Date for PT Re-Evaluation  01/01/20   Eval 10/23/19   PT Start Time  0903    PT Stop Time  0950    PT Time Calculation (min)  47 min    Activity Tolerance  Patient tolerated treatment well    Behavior During Therapy  [facility] for tasks assessed/performed       Past Medical History:  Diagnosis Date  . Depression    Dr. Jacqualyn Posey  . Eating disorder    bulemia  . Migraines     Past Surgical History:  Procedure Laterality Date  . CESAREAN SECTION N/A 10/07/2016   Procedure: CESAREAN SECTION;  Surgeon: Benjaman Kindler, MD;  Location: [facility] ORS;  Service: Obstetrics;  Laterality: N/A;  . CHOLECYSTECTOMY  2009  . LAPAROSCOPIC GASTRIC BANDING  2009   Dr. Johny Drilling  . LAPAROSCOPIC GASTRIC SLEEVE RESECTION  01/2013  . TONSILLECTOMY  2006  . UPPER GI ENDOSCOPY  2014   hiatial hernia, Dr. Darnell Level    There were no vitals filed for this visit.  Subjective Assessment - 12/25/19 1123    Subjective  Pt has not had any back pains                     Pelvic Floor Special Questions - 12/25/19 1124    External Palpation  quick contractions 5 reps,  hold holds 3 sec, 5 reps         [facility] Adult PT Treatment/Exercise - 12/25/19 0930      Therapeutic Activites    Other Therapeutic Activities  reassessed goals and administered FOTO , discussed post partum body mechanics and during labor       Neuro Re-ed    Neuro Re-ed Details   cued for pelvic floor contractions short, long                   PT Long Term Goals - 12/25/19 0931      PT LONG TERM GOAL #1    Title  Pt will report decreased pain by 50% at the end of the day after typical activities and to be able to sleep on her L side with improved without interruption of pain ( 11/27/19: 30% less , 5/13: with no pain , using mm relaxers as well) )    Time  6    Period  Weeks    Status  Achieved      PT LONG TERM GOAL #2   Title  Pt will report on FOTO "once or less than a week"  compared to " More than once a week"  for the question for "How often does urine leak for no obvious reasonwhen you are awake?"    Time  10    Period  Weeks    Status  Achieved      PT LONG TERM GOAL #3   Title  Pt will demo proper body mechanics to minimize straining of abdomen and pelvic floor with fitness routine    Time  2    Period  Weeks    Status  Achieved      PT LONG TERM GOAL #4   Title  Pt will demo  proper deep core coordination without chest breathing and optimal excursion of diaphragm/pelvic floor    Time  4    Period  Weeks    Status  Achieved      PT LONG TERM GOAL #5   Title  Pt will demo IND with pelvic floor exercises in sdelying and seated positions to minimize risk for urinary incontinence    Time  8    Period  Weeks    Status  Achieved            Plan - 12/25/19 1123    Clinical Impression Statement  Pt has achieved 100% of her goals with resolved LBP, no more urinary leakage, improved pelvic girdle stability, alignment, decreased mm tightness, and increased pelvic floor coordination and strength.Pt has remained compliant with HEP. Pt would benefit from post partum Pelvic PT. Pt is ready for d/c at this time.    Stability/Clinical Decision Making  Evolving/Moderate complexity    Rehab Potential  Good    PT Frequency  1x / week    PT Duration  Other (comment)   10   PT Treatment/Interventions  Moist Heat;Therapeutic exercise;Therapeutic activities;Functional mobility training;Stair training;Neuromuscular re-education;Scar mobilization;Patient/family education;Energy  conservation;Manual techniques    Consulted and Agree with Plan of Care  Patient       Patient will benefit from skilled therapeutic intervention in order to improve the following deficits and impairments:  Hypomobility, Improper body mechanics, Decreased strength, Postural dysfunction, Pain, Increased muscle spasms, Difficulty walking, Decreased coordination, Decreased endurance, Decreased mobility, Decreased scar mobility  Visit Diagnosis: Other lack of coordination  Muscle weakness (generalized)     Problem List Patient Active Problem List   Diagnosis Date Noted  . Indication for care in labor and delivery, antepartum 10/06/2016  . Indication for care in labor or delivery 10/06/2016  . Polyhydramnios affecting pregnancy 09/28/2016  . Obesity (BMI 30-39.9) 08/30/2012  . Depression 08/30/2012  . Migraine 08/30/2012    Mariane Masters ,PT, DPT, E-RYT  12/25/2019, 11:26 AM  [place] [facility] MAIN [facility] SERVICES [address] [place], [place], [address] Phone: [phone]   Fax:  [phone]  Name: TRAVIS PURK MRN: [id number] Date of Birth: May 14, 1981

## 2019-12-25 NOTE — Patient Instructions (Signed)
  Transition from standing to floor with baby , prop onto couch with forearms  :  stand to floor transfer :      _ slow     _ mini squat      _ Both feet back with toes tucked, know both knees  __  Labor breathing, exhale to push          ___  PELVIC FLOOR / KEGEL EXERCISE  LONG HOLDS:   Position: on back or reclined in car seat ( do not lift head to sit up, instead make sure to use the handle to raise the car seat up and keep spine/head relaxed to not place load on pelvic floor/ abdominal muscle)    Inhale and then exhale. Then squeeze the muscle and count aloud for 3 seconds. Rest with three long breaths. (Be sure to let belly sink in with exhales and not push outward)  Perform 5 repetitions, 3 times/day  SHORT HOLDS: Position: on back, sitting   Inhale and then exhale. Then squeeze the muscle.  (Be sure to let belly sink in with exhales and not push outward)  Perform 5 repetitions, 5  Times/day                DECREASE DOWNWARD PRESSURE ON  YOUR PELVIC FLOOR, ABDOMINAL, LOW BACK MUSCLES       PRESERVE YOUR PELVIC HEALTH LONG-TERM   ** SQUEEZE pelvic floor BEFORE YOUR SNEEZE, COUGH, LAUGH   ** EXHALE BEFORE YOU RISE AGAINST GRAVITY (lifting, sit to stand, from squat to stand)   ** LOG ROLL OUT OF BED INSTEAD OF CRUNCH/SIT-UP

## 2020-01-13 DIAGNOSIS — O99843 Bariatric surgery status complicating pregnancy, third trimester: Secondary | ICD-10-CM | POA: Diagnosis not present

## 2020-01-13 DIAGNOSIS — Z9884 Bariatric surgery status: Secondary | ICD-10-CM | POA: Diagnosis not present

## 2020-02-02 LAB — OB RESULTS CONSOLE GBS: GBS: NEGATIVE

## 2020-02-02 LAB — OB RESULTS CONSOLE HIV ANTIBODY (ROUTINE TESTING): HIV: NONREACTIVE

## 2020-02-02 LAB — OB RESULTS CONSOLE RPR: RPR: NONREACTIVE

## 2020-02-10 DIAGNOSIS — O99843 Bariatric surgery status complicating pregnancy, third trimester: Secondary | ICD-10-CM | POA: Diagnosis not present

## 2020-02-10 DIAGNOSIS — Z9884 Bariatric surgery status: Secondary | ICD-10-CM | POA: Diagnosis not present

## 2020-02-10 DIAGNOSIS — Z6841 Body Mass Index (BMI) 40.0 and over, adult: Secondary | ICD-10-CM | POA: Diagnosis not present

## 2020-02-10 DIAGNOSIS — Z9889 Other specified postprocedural states: Secondary | ICD-10-CM | POA: Diagnosis not present

## 2020-02-10 DIAGNOSIS — Z3483 Encounter for supervision of other normal pregnancy, third trimester: Secondary | ICD-10-CM | POA: Diagnosis not present

## 2020-02-10 LAB — OB RESULTS CONSOLE GBS: GBS: NEGATIVE

## 2020-02-13 DIAGNOSIS — O99843 Bariatric surgery status complicating pregnancy, third trimester: Secondary | ICD-10-CM | POA: Diagnosis not present

## 2020-02-13 DIAGNOSIS — Z3483 Encounter for supervision of other normal pregnancy, third trimester: Secondary | ICD-10-CM | POA: Diagnosis not present

## 2020-02-13 DIAGNOSIS — Z6841 Body Mass Index (BMI) 40.0 and over, adult: Secondary | ICD-10-CM | POA: Diagnosis not present

## 2020-02-13 DIAGNOSIS — Z9889 Other specified postprocedural states: Secondary | ICD-10-CM | POA: Diagnosis not present

## 2020-02-17 ENCOUNTER — Other Ambulatory Visit: Payer: Self-pay

## 2020-02-17 DIAGNOSIS — O09523 Supervision of elderly multigravida, third trimester: Secondary | ICD-10-CM | POA: Diagnosis not present

## 2020-02-17 DIAGNOSIS — O99843 Bariatric surgery status complicating pregnancy, third trimester: Secondary | ICD-10-CM | POA: Diagnosis not present

## 2020-02-17 DIAGNOSIS — Z114 Encounter for screening for human immunodeficiency virus [HIV]: Secondary | ICD-10-CM | POA: Diagnosis not present

## 2020-02-17 DIAGNOSIS — O0993 Supervision of high risk pregnancy, unspecified, third trimester: Secondary | ICD-10-CM | POA: Diagnosis not present

## 2020-02-18 DIAGNOSIS — Z03818 Encounter for observation for suspected exposure to other biological agents ruled out: Secondary | ICD-10-CM | POA: Diagnosis not present

## 2020-02-18 DIAGNOSIS — J029 Acute pharyngitis, unspecified: Secondary | ICD-10-CM | POA: Diagnosis not present

## 2020-02-18 DIAGNOSIS — U071 COVID-19: Secondary | ICD-10-CM | POA: Diagnosis not present

## 2020-02-18 DIAGNOSIS — J019 Acute sinusitis, unspecified: Secondary | ICD-10-CM | POA: Diagnosis not present

## 2020-02-24 ENCOUNTER — Other Ambulatory Visit: Payer: Self-pay

## 2020-02-24 ENCOUNTER — Emergency Department: Payer: Medicaid Other

## 2020-02-24 ENCOUNTER — Encounter
Admission: EM | Disposition: A | Discharge status: Home / Self Care | Payer: Self-pay | Source: Home / Self Care | Attending: Obstetrics and Gynecology

## 2020-02-24 ENCOUNTER — Inpatient Hospital Stay: Payer: Medicaid Other | Admitting: Anesthesiology

## 2020-02-24 ENCOUNTER — Inpatient Hospital Stay
Admission: EM | Admit: 2020-02-24 | Discharge: 2020-02-28 | DRG: 786 | Disposition: A | Discharge status: Home / Self Care | Payer: Medicaid Other | Attending: Obstetrics and Gynecology | Admitting: Obstetrics and Gynecology

## 2020-02-24 ENCOUNTER — Encounter: Payer: Self-pay | Admitting: Medical Oncology

## 2020-02-24 DIAGNOSIS — A09 Infectious gastroenteritis and colitis, unspecified: Secondary | ICD-10-CM | POA: Diagnosis not present

## 2020-02-24 DIAGNOSIS — F329 Major depressive disorder, single episode, unspecified: Secondary | ICD-10-CM | POA: Diagnosis not present

## 2020-02-24 DIAGNOSIS — O9853 Other viral diseases complicating the puerperium: Secondary | ICD-10-CM | POA: Diagnosis not present

## 2020-02-24 DIAGNOSIS — R05 Cough: Secondary | ICD-10-CM

## 2020-02-24 DIAGNOSIS — O9852 Other viral diseases complicating childbirth: Secondary | ICD-10-CM | POA: Diagnosis not present

## 2020-02-24 DIAGNOSIS — J189 Pneumonia, unspecified organism: Secondary | ICD-10-CM | POA: Diagnosis present

## 2020-02-24 DIAGNOSIS — O9081 Anemia of the puerperium: Secondary | ICD-10-CM | POA: Diagnosis not present

## 2020-02-24 DIAGNOSIS — D62 Acute posthemorrhagic anemia: Secondary | ICD-10-CM | POA: Diagnosis not present

## 2020-02-24 DIAGNOSIS — R000 Tachycardia, unspecified: Secondary | ICD-10-CM

## 2020-02-24 DIAGNOSIS — Z3A38 38 weeks gestation of pregnancy: Secondary | ICD-10-CM

## 2020-02-24 DIAGNOSIS — R059 Cough, unspecified: Secondary | ICD-10-CM

## 2020-02-24 DIAGNOSIS — R0602 Shortness of breath: Secondary | ICD-10-CM | POA: Diagnosis not present

## 2020-02-24 DIAGNOSIS — J1282 Pneumonia due to coronavirus disease 2019: Secondary | ICD-10-CM | POA: Diagnosis present

## 2020-02-24 DIAGNOSIS — O99344 Other mental disorders complicating childbirth: Secondary | ICD-10-CM | POA: Diagnosis present

## 2020-02-24 DIAGNOSIS — O99844 Bariatric surgery status complicating childbirth: Secondary | ICD-10-CM | POA: Diagnosis not present

## 2020-02-24 DIAGNOSIS — O99519 Diseases of the respiratory system complicating pregnancy, unspecified trimester: Secondary | ICD-10-CM | POA: Diagnosis not present

## 2020-02-24 DIAGNOSIS — J159 Unspecified bacterial pneumonia: Secondary | ICD-10-CM | POA: Diagnosis not present

## 2020-02-24 DIAGNOSIS — O98513 Other viral diseases complicating pregnancy, third trimester: Secondary | ICD-10-CM | POA: Diagnosis not present

## 2020-02-24 DIAGNOSIS — Z9889 Other specified postprocedural states: Secondary | ICD-10-CM

## 2020-02-24 DIAGNOSIS — R197 Diarrhea, unspecified: Secondary | ICD-10-CM | POA: Diagnosis present

## 2020-02-24 DIAGNOSIS — O99891 Other specified diseases and conditions complicating pregnancy: Secondary | ICD-10-CM | POA: Diagnosis not present

## 2020-02-24 DIAGNOSIS — O34211 Maternal care for low transverse scar from previous cesarean delivery: Secondary | ICD-10-CM | POA: Diagnosis not present

## 2020-02-24 DIAGNOSIS — R0600 Dyspnea, unspecified: Secondary | ICD-10-CM | POA: Diagnosis not present

## 2020-02-24 DIAGNOSIS — J9601 Acute respiratory failure with hypoxia: Secondary | ICD-10-CM | POA: Diagnosis not present

## 2020-02-24 DIAGNOSIS — O9952 Diseases of the respiratory system complicating childbirth: Principal | ICD-10-CM | POA: Diagnosis present

## 2020-02-24 DIAGNOSIS — U071 COVID-19: Secondary | ICD-10-CM | POA: Diagnosis present

## 2020-02-24 DIAGNOSIS — F32A Depression, unspecified: Secondary | ICD-10-CM | POA: Diagnosis present

## 2020-02-24 LAB — CBC WITH DIFFERENTIAL/PLATELET
Abs Immature Granulocytes: 0.42 10*3/uL — ABNORMAL HIGH (ref 0.00–0.07)
Basophils Absolute: 0 10*3/uL (ref 0.0–0.1)
Basophils Relative: 0 %
Eosinophils Absolute: 0 10*3/uL (ref 0.0–0.5)
Eosinophils Relative: 0 %
HCT: 34.2 % — ABNORMAL LOW (ref 36.0–46.0)
Hemoglobin: 11.1 g/dL — ABNORMAL LOW (ref 12.0–15.0)
Immature Granulocytes: 4 %
Lymphocytes Relative: 9 %
Lymphs Abs: 0.9 10*3/uL (ref 0.7–4.0)
MCH: 25.5 pg — ABNORMAL LOW (ref 26.0–34.0)
MCHC: 32.5 g/dL (ref 30.0–36.0)
MCV: 78.4 fL — ABNORMAL LOW (ref 80.0–100.0)
Monocytes Absolute: 0.4 10*3/uL (ref 0.1–1.0)
Monocytes Relative: 4 %
Neutro Abs: 8 10*3/uL — ABNORMAL HIGH (ref 1.7–7.7)
Neutrophils Relative %: 83 %
Platelets: 165 10*3/uL (ref 150–400)
RBC: 4.36 MIL/uL (ref 3.87–5.11)
RDW: 15.9 % — ABNORMAL HIGH (ref 11.5–15.5)
WBC: 9.7 10*3/uL (ref 4.0–10.5)
nRBC: 0.4 % — ABNORMAL HIGH (ref 0.0–0.2)

## 2020-02-24 LAB — TYPE AND SCREEN
ABO/RH(D): A POS
Antibody Screen: NEGATIVE

## 2020-02-24 LAB — TROPONIN I (HIGH SENSITIVITY)
Troponin I (High Sensitivity): 8 ng/L (ref <18)
Troponin I (High Sensitivity): 9 ng/L (ref <18)

## 2020-02-24 LAB — MAGNESIUM: Magnesium: 1.9 mg/dL (ref 1.7–2.4)

## 2020-02-24 LAB — LACTATE DEHYDROGENASE: LDH: 250 U/L — ABNORMAL HIGH (ref 98–192)

## 2020-02-24 LAB — COMPREHENSIVE METABOLIC PANEL
ALT: 24 U/L (ref 0–44)
AST: 45 U/L — ABNORMAL HIGH (ref 15–41)
Albumin: 2.5 g/dL — ABNORMAL LOW (ref 3.5–5.0)
Alkaline Phosphatase: 164 U/L — ABNORMAL HIGH (ref 38–126)
Anion gap: 11 (ref 5–15)
BUN: 6 mg/dL (ref 6–20)
CO2: 18 mmol/L — ABNORMAL LOW (ref 22–32)
Calcium: 8 mg/dL — ABNORMAL LOW (ref 8.9–10.3)
Chloride: 107 mmol/L (ref 98–111)
Creatinine, Ser: 0.89 mg/dL (ref 0.44–1.00)
GFR calc Af Amer: >60 mL/min (ref >60)
GFR calc non Af Amer: >60 mL/min (ref >60)
Glucose, Bld: 98 mg/dL (ref 70–99)
Potassium: 3.6 mmol/L (ref 3.5–5.1)
Sodium: 136 mmol/L (ref 135–145)
Total Bilirubin: 0.6 mg/dL (ref 0.3–1.2)
Total Protein: 5.9 g/dL — ABNORMAL LOW (ref 6.5–8.1)

## 2020-02-24 LAB — BRAIN NATRIURETIC PEPTIDE
B Natriuretic Peptide: 66.4 pg/mL (ref 0.0–100.0)
B Natriuretic Peptide: 41.5 pg/mL (ref 0.0–100.0)

## 2020-02-24 LAB — HEPATITIS B SURFACE ANTIGEN: Hepatitis B Surface Ag: NONREACTIVE

## 2020-02-24 LAB — HIV ANTIBODY (ROUTINE TESTING W REFLEX): HIV Screen 4th Generation wRfx: NONREACTIVE

## 2020-02-24 LAB — C-REACTIVE PROTEIN: CRP: 15 mg/dL — ABNORMAL HIGH (ref <1.0)

## 2020-02-24 LAB — TRIGLYCERIDES: Triglycerides: 293 mg/dL — ABNORMAL HIGH (ref <150)

## 2020-02-24 LAB — FIBRINOGEN: Fibrinogen: 462 mg/dL (ref 210–475)

## 2020-02-24 LAB — FERRITIN: Ferritin: 50 ng/mL (ref 11–307)

## 2020-02-24 LAB — ABO/RH: ABO/RH(D): A POS

## 2020-02-24 LAB — FIBRIN DERIVATIVES D-DIMER (ARMC ONLY): Fibrin derivatives D-dimer (ARMC): 1492.96 ng/mL (FEU) — ABNORMAL HIGH (ref 0.00–499.00)

## 2020-02-24 LAB — PROCALCITONIN: Procalcitonin: 1 ng/mL

## 2020-02-24 SURGERY — Surgical Case
Anesthesia: Spinal

## 2020-02-24 MED ORDER — DIPHENHYDRAMINE HCL 25 MG PO CAPS: 25.0000 mg | ORAL_CAPSULE | ORAL | Status: DC | PRN

## 2020-02-24 MED ORDER — FENTANYL CITRATE (PF) 100 MCG/2ML IJ SOLN
INTRAMUSCULAR | Status: AC
  Filled 2020-02-24: qty 2

## 2020-02-24 MED ORDER — ALBUTEROL SULFATE HFA 108 (90 BASE) MCG/ACT IN AERS
2.0000 | INHALATION_SPRAY | RESPIRATORY_TRACT | Status: DC | PRN
  Administered 2020-02-26 – 2020-02-28 (×2): 2 via RESPIRATORY_TRACT
  Filled 2020-02-24 (×2): qty 6.7

## 2020-02-24 MED ORDER — ASCORBIC ACID 500 MG PO TABS
500.0000 mg | ORAL_TABLET | Freq: Every day | ORAL | Status: DC
  Administered 2020-02-24 – 2020-02-28 (×5): 500 mg via ORAL
  Filled 2020-02-24 (×5): qty 1

## 2020-02-24 MED ORDER — MORPHINE SULFATE (PF) 0.5 MG/ML IJ SOLN
INTRAMUSCULAR | Status: DC | PRN
  Administered 2020-02-24: .1 mg via EPIDURAL

## 2020-02-24 MED ORDER — BUPIVACAINE LIPOSOME 1.3 % IJ SUSP
INTRAMUSCULAR | Status: AC
  Filled 2020-02-24: qty 20

## 2020-02-24 MED ORDER — SCOPOLAMINE 1 MG/3DAYS TD PT72
1.0000 | MEDICATED_PATCH | Freq: Once | TRANSDERMAL | Status: DC
  Filled 2020-02-24: qty 1

## 2020-02-24 MED ORDER — BUPIVACAINE HCL (PF) 0.5 % IJ SOLN
INTRAMUSCULAR | Status: AC
  Filled 2020-02-24: qty 30

## 2020-02-24 MED ORDER — SODIUM CHLORIDE 0.9 % IV SOLN
INTRAVENOUS | Status: DC | PRN
  Administered 2020-02-24: 50 ug/min via INTRAVENOUS

## 2020-02-24 MED ORDER — OXYTOCIN-SODIUM CHLORIDE 30-0.9 UT/500ML-% IV SOLN
INTRAVENOUS | Status: AC
  Filled 2020-02-24: qty 500

## 2020-02-24 MED ORDER — OXYTOCIN-SODIUM CHLORIDE 30-0.9 UT/500ML-% IV SOLN
INTRAVENOUS | Status: DC | PRN
  Administered 2020-02-24: 400 mL via INTRAVENOUS
  Administered 2020-02-24: 500 mL/h via INTRAVENOUS

## 2020-02-24 MED ORDER — NALOXONE HCL 4 MG/10ML IJ SOLN
1.0000 ug/kg/h | INTRAVENOUS | Status: DC | PRN
  Filled 2020-02-24: qty 5

## 2020-02-24 MED ORDER — SODIUM CHLORIDE 0.9 % IV SOLN
200.0000 mg | Freq: Once | INTRAVENOUS | Status: AC
  Administered 2020-02-24: 200 mg via INTRAVENOUS
  Filled 2020-02-24: qty 40

## 2020-02-24 MED ORDER — MEPERIDINE HCL 25 MG/ML IJ SOLN: 6.2500 mg | INTRAMUSCULAR | Status: DC | PRN

## 2020-02-24 MED ORDER — BUPIVACAINE LIPOSOME 1.3 % IJ SUSP
INTRAMUSCULAR | Status: DC | PRN
  Administered 2020-02-24: 30 mL

## 2020-02-24 MED ORDER — LACTATED RINGERS IV SOLN
INTRAVENOUS | Status: DC | PRN
Start: 2020-02-24 — End: 2020-02-24

## 2020-02-24 MED ORDER — ONDANSETRON HCL 4 MG/2ML IJ SOLN
INTRAMUSCULAR | Status: DC | PRN
  Administered 2020-02-24: 4 mg via INTRAVENOUS

## 2020-02-24 MED ORDER — LACTATED RINGERS IV BOLUS
500.0000 mL | Freq: Once | INTRAVENOUS | Status: AC
  Administered 2020-02-24: 500 mL via INTRAVENOUS

## 2020-02-24 MED ORDER — DIBUCAINE (PERIANAL) 1 % EX OINT: 1.0000 "application " | TOPICAL_OINTMENT | CUTANEOUS | Status: DC | PRN

## 2020-02-24 MED ORDER — NALBUPHINE HCL 10 MG/ML IJ SOLN: 5.0000 mg | INTRAMUSCULAR | Status: DC | PRN

## 2020-02-24 MED ORDER — SODIUM CHLORIDE (PF) 0.9 % IJ SOLN
INTRAMUSCULAR | Status: AC
  Filled 2020-02-24: qty 50

## 2020-02-24 MED ORDER — METHYLPREDNISOLONE SODIUM SUCC 125 MG IJ SOLR
60.0000 mg | Freq: Two times a day (BID) | INTRAMUSCULAR | Status: DC
  Administered 2020-02-24 – 2020-02-28 (×8): 60 mg via INTRAVENOUS
  Filled 2020-02-24 (×12): qty 0.96

## 2020-02-24 MED ORDER — WITCH HAZEL-GLYCERIN EX PADS: 1.0000 "application " | MEDICATED_PAD | CUTANEOUS | Status: DC | PRN

## 2020-02-24 MED ORDER — NALBUPHINE HCL 10 MG/ML IJ SOLN: 5.0000 mg | Freq: Once | INTRAMUSCULAR | Status: DC | PRN

## 2020-02-24 MED ORDER — ZINC SULFATE 220 (50 ZN) MG PO CAPS
220.0000 mg | ORAL_CAPSULE | Freq: Every day | ORAL | Status: DC
  Administered 2020-02-24 – 2020-02-28 (×5): 220 mg via ORAL
  Filled 2020-02-24 (×5): qty 1

## 2020-02-24 MED ORDER — DIPHENHYDRAMINE HCL 50 MG/ML IJ SOLN: 12.5000 mg | INTRAMUSCULAR | Status: DC | PRN

## 2020-02-24 MED ORDER — ONDANSETRON HCL 4 MG/2ML IJ SOLN: 4.0000 mg | Freq: Three times a day (TID) | INTRAMUSCULAR | Status: DC | PRN

## 2020-02-24 MED ORDER — DM-GUAIFENESIN ER 30-600 MG PO TB12
1.0000 | ORAL_TABLET | Freq: Two times a day (BID) | ORAL | Status: DC | PRN
  Administered 2020-02-25 – 2020-02-27 (×3): 1 via ORAL
  Filled 2020-02-24 (×7): qty 1

## 2020-02-24 MED ORDER — MORPHINE SULFATE (PF) 0.5 MG/ML IJ SOLN
INTRAMUSCULAR | Status: AC
  Filled 2020-02-24: qty 10

## 2020-02-24 MED ORDER — ACETAMINOPHEN 500 MG PO TABS
1000.0000 mg | ORAL_TABLET | Freq: Four times a day (QID) | ORAL | Status: AC
  Administered 2020-02-24 – 2020-02-25 (×4): 1000 mg via ORAL
  Filled 2020-02-24 (×3): qty 2

## 2020-02-24 MED ORDER — DEXTROSE 5 % IV SOLN
3.0000 g | Freq: Once | INTRAVENOUS | Status: AC
  Administered 2020-02-24: 3 g via INTRAVENOUS
  Filled 2020-02-24: qty 3

## 2020-02-24 MED ORDER — SODIUM CHLORIDE 0.9 % IV SOLN
100.0000 mg | Freq: Every day | INTRAVENOUS | Status: AC
  Administered 2020-02-25 – 2020-02-28 (×4): 100 mg via INTRAVENOUS
  Filled 2020-02-24: qty 100
  Filled 2020-02-24: qty 20
  Filled 2020-02-24 (×2): qty 100

## 2020-02-24 MED ORDER — BUPIVACAINE HCL 0.5 % IJ SOLN
INTRAMUSCULAR | Status: DC | PRN
  Administered 2020-02-24: 50 mL

## 2020-02-24 MED ORDER — IPRATROPIUM BROMIDE HFA 17 MCG/ACT IN AERS
2.0000 | INHALATION_SPRAY | RESPIRATORY_TRACT | Status: DC
  Administered 2020-02-24 – 2020-02-28 (×23): 2 via RESPIRATORY_TRACT
  Filled 2020-02-24: qty 12.9

## 2020-02-24 MED ORDER — BUPIVACAINE IN DEXTROSE 0.75-8.25 % IT SOLN
INTRATHECAL | Status: DC | PRN
  Administered 2020-02-24: 1.6 mL via INTRATHECAL

## 2020-02-24 MED ORDER — PHENYLEPHRINE HCL (PRESSORS) 10 MG/ML IV SOLN
INTRAVENOUS | Status: AC
  Filled 2020-02-24: qty 1

## 2020-02-24 MED ORDER — SOD CITRATE-CITRIC ACID 500-334 MG/5ML PO SOLN
ORAL | Status: AC
  Administered 2020-02-24: 30 mL
  Filled 2020-02-24: qty 15

## 2020-02-24 MED ORDER — FENTANYL CITRATE (PF) 100 MCG/2ML IJ SOLN
INTRAMUSCULAR | Status: DC | PRN
  Administered 2020-02-24: 15 ug via INTRAVENOUS

## 2020-02-24 MED ORDER — PRENATAL PLUS 27-1 MG PO TABS
1.0000 | ORAL_TABLET | Freq: Every day | ORAL | Status: DC
  Administered 2020-02-25 – 2020-02-26 (×2): 1 via ORAL
  Filled 2020-02-24 (×2): qty 1

## 2020-02-24 MED ORDER — OXYTOCIN-SODIUM CHLORIDE 30-0.9 UT/500ML-% IV SOLN
INTRAVENOUS | Status: AC
  Filled 2020-02-24: qty 1000

## 2020-02-24 MED ORDER — ACETAMINOPHEN 500 MG PO TABS
1000.0000 mg | ORAL_TABLET | Freq: Once | ORAL | Status: AC
  Administered 2020-02-24: 1000 mg via ORAL
  Filled 2020-02-24: qty 2

## 2020-02-24 MED ORDER — NALOXONE HCL 0.4 MG/ML IJ SOLN: 0.4000 mg | INTRAMUSCULAR | Status: DC | PRN

## 2020-02-24 MED ORDER — SODIUM CHLORIDE 0.9% FLUSH
3.0000 mL | INTRAVENOUS | Status: DC | PRN
  Administered 2020-02-27: 3 mL via INTRAVENOUS

## 2020-02-24 SURGICAL SUPPLY — 32 items
APL PRP STRL LF DISP 70% ISPRP (MISCELLANEOUS) ×1
BARRIER ADHS 3X4 INTERCEED (GAUZE/BANDAGES/DRESSINGS) ×2 IMPLANT
BRR ADH 4X3 ABS CNTRL BYND (GAUZE/BANDAGES/DRESSINGS) ×1
CANISTER SUCT 3000ML PPV (MISCELLANEOUS) ×2 IMPLANT
CHLORAPREP W/TINT 26 (MISCELLANEOUS) ×2 IMPLANT
COVER WAND RF STERILE (DRAPES) ×2 IMPLANT
DRSG TELFA 3X8 NADH (GAUZE/BANDAGES/DRESSINGS) ×2 IMPLANT
ELECT CAUTERY BLADE 6.4 (BLADE) ×2 IMPLANT
ELECT REM PT RETURN 9FT ADLT (ELECTROSURGICAL) ×2
ELECTRODE REM PT RTRN 9FT ADLT (ELECTROSURGICAL) ×1 IMPLANT
GAUZE SPONGE 4X4 12PLY STRL (GAUZE/BANDAGES/DRESSINGS) ×2 IMPLANT
GAUZE SPONGE 4X4 8PLY STR LF (GAUZE/BANDAGES/DRESSINGS) ×24 IMPLANT
GLOVE SURG SYN 8.0 (GLOVE) ×2 IMPLANT
GOWN STRL REUS W/ TWL LRG LVL3 (GOWN DISPOSABLE) ×2 IMPLANT
GOWN STRL REUS W/ TWL XL LVL3 (GOWN DISPOSABLE) ×1 IMPLANT
GOWN STRL REUS W/TWL LRG LVL3 (GOWN DISPOSABLE) ×4
GOWN STRL REUS W/TWL XL LVL3 (GOWN DISPOSABLE) ×2
NEEDLE HYPO 22GX1.5 SAFETY (NEEDLE) ×2 IMPLANT
NS IRRIG 1000ML POUR BTL (IV SOLUTION) ×2 IMPLANT
PACK C SECTION (MISCELLANEOUS) ×2 IMPLANT
PAD OB MATERNITY 4.3X12.25 (PERSONAL CARE ITEMS) ×2 IMPLANT
PAD PREP 24X41 OB/GYN DISP (PERSONAL CARE ITEMS) ×2 IMPLANT
PAD TELFA 2X3 NADH STRL (GAUZE/BANDAGES/DRESSINGS) ×2 IMPLANT
STRAP SAFETY 5IN WIDE (MISCELLANEOUS) ×2 IMPLANT
SUCT VACUUM KIWI BELL (SUCTIONS) ×2 IMPLANT
SUT CHROMIC 1 CTX 36 (SUTURE) ×6 IMPLANT
SUT CHROMIC 2-0 (SUTURE) ×2 IMPLANT
SUT PLAIN GUT 0 (SUTURE) ×4 IMPLANT
SUT VIC AB 0 CT1 36 (SUTURE) ×4 IMPLANT
SUT VICRYL 2-0 SH 8X27 (SUTURE) ×2 IMPLANT
SYR 30ML LL (SYRINGE) ×4 IMPLANT
TAPE PAPER MEDFIX 1IN X 10YD (GAUZE/BANDAGES/DRESSINGS) ×2 IMPLANT

## 2020-02-24 NOTE — Consult Note (Signed)
Medical Consultation   Sue Hernandez  XNT:[id number]  DOB: 04-07-1981  DOA: 02/24/2020  PCP: Allegra Grana, FNP   Outpatient Specialists:   Requesting physician: OB/GYN, Haroldine Laws  Reason for consultation: -Pneumonia due to COVID-19 infection  History of Present Illness: Sue Hernandez is an 39 y.o. female with past medical history of depression, migraine headache, eating disorder, 38-week pregnancy, who presents with shortness of breath.  Patient states that she has been having cough, shortness breath, fever and chills for almost 2 weeks. She had positive Covid test on 02/18/2020.  Patient does not have chest pain.  Patient has 38-week pregnancy.  No vaginal bleeding or vaginal discharge.  She does not have nausea, vomiting or abdominal pain.  She states that she has diarrhea in the past several days. She has had 4 times of watery diarrhea since yesterday.  No symptoms of UTI or unilateral weakness.  ED course: WBC 9.7, BNP 66.4, electrolytes renal function okay, temperature normal, tachycardia, blood pressure 128/84, RR 24, oxygen saturation 96% on room air.  Chest x-ray showed bilateral multifocal infiltration.  Review of Systems:   General: has fevers, chills, no changes in body weight, no changes in appetite Skin: no rash HEENT: no blurry vision, hearing changes or sore throat Pulm: has dyspnea, coughing, no wheezing CV: no chest pain, palpitations, shortness of breath Abd: no nausea/vomiting, abdominal pain, constipation. Has diarrhea. GU: no dysuria, hematuria, polyuria Ext: no arthralgias, myalgias Neuro: no weakness, numbness, or tingling    Past Medical History: Past Medical History:  Diagnosis Date  . Depression    Dr. Ardelle Anton  . Eating disorder    bulemia  . Migraines     Past Surgical History: Past Surgical History:  Procedure Laterality Date  . CESAREAN SECTION N/A 10/07/2016   Procedure: CESAREAN SECTION;  Surgeon: Christeen Douglas, MD;  Location: [facility] ORS;  Service: Obstetrics;  Laterality: N/A;  . CHOLECYSTECTOMY  2009  . LAPAROSCOPIC GASTRIC BANDING  2009   Dr. Terrill Mohr  . LAPAROSCOPIC GASTRIC SLEEVE RESECTION  01/2013  . TONSILLECTOMY  2006  . UPPER GI ENDOSCOPY  2014   hiatial hernia, Dr. Smitty Cords     Allergies:   Allergies  Allergen Reactions  . Ibuprofen Swelling    Swelling of face and hands  . Derma Guard [Protective Adhesive Powder]     Dermabond - urticaria     Social History:  reports that she has never smoked. She has never used smokeless tobacco. She reports that she does not drink alcohol and does not use drugs.   Family History: Family History  Problem Relation Age of Onset  . Cancer Maternal Grandmother        lung  . Cancer Paternal Grandmother        Breast  . Heart disease Paternal Grandfather   . Cancer Paternal Grandfather        renal cancer went to his lungs  . Cancer Maternal Grandfather        lung     Physical Exam: Vitals:   02/24/20 1200 02/24/20 1330 02/24/20 1400 02/24/20 1430  BP: (!) 150/96 122/79 133/85 (!) 131/92  Pulse: (!) 116 (!) 101 (!) 102 (!) 114  Resp: (!) 26 14 18  (!) 24  Temp:      TempSrc:      SpO2: 94% 96% 97% 95%  Weight:      Height:  General: Not in acute distress HEENT: PERRL, EOMI, no scleral icterus, No JVD or bruit Cardiac: S1/S2, RRR, No murmurs, gallops or rubs Pulm: Clear to auscultation bilaterally. No rales, wheezing, rhonchi or rubs. Abd: Soft, nontender, no rebound pain, no organomegaly, BS present.  Abdominal size is consistent with 38-week pregnancy. Ext: No edema. 1+DP/PT pulse bilaterally Musculoskeletal: No joint deformities, erythema, or stiffness, ROM full Skin: No rashes.  Neuro: Alert and oriented X3, cranial nerves II-XII grossly intact.  Moves all extremities normally . Psych: Patient is not psychotic, no suicidal or hemocidal ideation.   Data reviewed:  I have personally reviewed following labs and  imaging studies Labs:  CBC: Recent Labs  Lab 02/24/20 1049  WBC 9.7  NEUTROABS 8.0*  HGB 11.1*  HCT 34.2*  MCV 78.4*  PLT 165    Basic Metabolic Panel: Recent Labs  Lab 02/24/20 1049  NA 136  K 3.6  CL 107  CO2 18*  GLUCOSE 98  BUN 6  CREATININE 0.89  CALCIUM 8.0*  MG 1.9   GFR Estimated Creatinine Clearance: 107.4 mL/min (by C-G formula based on SCr of 0.89 mg/dL). Liver Function Tests: Recent Labs  Lab 02/24/20 1049  AST 45*  ALT 24  ALKPHOS 164*  BILITOT 0.6  PROT 5.9*  ALBUMIN 2.5*   No results for input(s): LIPASE, AMYLASE in the last 168 hours. No results for input(s): AMMONIA in the last 168 hours. Coagulation profile No results for input(s): INR, PROTIME in the last 168 hours.  Cardiac Enzymes: No results for input(s): CKTOTAL, CKMB, CKMBINDEX, TROPONINI in the last 168 hours. BNP: Invalid input(s): POCBNP CBG: No results for input(s): GLUCAP in the last 168 hours. D-Dimer No results for input(s): DDIMER in the last 72 hours. Hgb A1c No results for input(s): HGBA1C in the last 72 hours. Lipid Profile No results for input(s): CHOL, HDL, LDLCALC, TRIG, CHOLHDL, LDLDIRECT in the last 72 hours. Thyroid function studies No results for input(s): TSH, T4TOTAL, T3FREE, THYROIDAB in the last 72 hours.  Invalid input(s): FREET3 Anemia work up No results for input(s): VITAMINB12, FOLATE, FERRITIN, TIBC, IRON, RETICCTPCT in the last 72 hours. Urinalysis No results found for: COLORURINE, APPEARANCEUR, LABSPEC, PHURINE, GLUCOSEU, HGBUR, BILIRUBINUR, KETONESUR, PROTEINUR, UROBILINOGEN, NITRITE, LEUKOCYTESUR   Microbiology No results found for this or any previous visit (from the past 240 hour(s)).     Inpatient Medications:   Scheduled Meds: . vitamin C  500 mg Oral Daily  . ipratropium  2 puff Inhalation Q4H  . methylPREDNISolone (SOLU-MEDROL) injection  60 mg Intravenous Q12H  . zinc sulfate  220 mg Oral Daily   Continuous Infusions: .  [START ON 02/25/2020] remdesivir 100 mg in NS 100 mL       Radiological Exams on Admission: DG Chest 2 View  Result Date: 02/24/2020 CLINICAL DATA:  Tachycardia. Additional history provided: Patient [redacted] weeks pregnant, reportedly diagnosed with COVID last week, difficulty breathing. EXAM: CHEST - 2 VIEW COMPARISON:  Prior chest radiographs 07/25/2012 FINDINGS: Heart size within normal limits. There are fairly extensive multifocal airspace opacities bilaterally with a mid to lower lung predominance. No evidence of pleural effusion or pneumothorax. Incidentally noted azygos fissure. No acute bony abnormality identified. IMPRESSION: Fairly extensive bilateral multifocal airspace opacities with a mid to lower lung predominance. These findings likely reflect multifocal pneumonia given the provided history of COVID positivity. Electronically Signed   By: Jackey Loge DO   On: 02/24/2020 10:38   US Venous Img Lower Bilateral (DVT)  Result Date: 02/24/2020  CLINICAL DATA:  Tachycardia, shortness of breath. EXAM: Bilateral LOWER EXTREMITY VENOUS DOPPLER ULTRASOUND TECHNIQUE: Gray-scale sonography with compression, as well as color and duplex ultrasound, were performed to evaluate the deep venous system(s) from the level of the common femoral vein through the popliteal and proximal calf veins. COMPARISON:  None. FINDINGS: VENOUS Normal compressibility of the common femoral, superficial femoral, and popliteal veins, as well as the visualized calf veins. Visualized portions of profunda femoral vein and great saphenous vein unremarkable. No filling defects to suggest DVT on grayscale or color Doppler imaging. Doppler waveforms show normal direction of venous flow, normal respiratory plasticity and response to augmentation. Limited views of the contralateral common femoral vein are unremarkable. OTHER None. Limitations: none IMPRESSION: No evidence of deep venous thrombosis seen in either lower extremity. Electronically  Signed   By: Lupita Raider M.D.   On: 02/24/2020 11:54    Impression/Recommendations Principal Problem:   Pneumonia due to COVID-19 virus Active Problems:   Depression   [redacted] weeks gestation of pregnancy   Diarrhea   Pneumonia due to COVID-19 virus: Patient does not have oxygen desaturation, but chest x-ray showed bilateral multifocal infiltration. -Remdesivir per pharm -Solumedrol 60 mg bid -vitamin C, zinc.  -Bronchodilators -PRN Mucinex for cough -f/u Blood culture -Gentle IV fluid:  -D-dimer, BNP,Trop, LFT, CRP, LDH, Procalcitonin, Ferritin, fibinogen, TG, Hep B SAg, HIV ab -Daily CRP, Ferritin, D-dimer, -If patient desaturated, if it is okay with OB/GYN, will ask the patient to maintain an awake prone position for 16+ hours a day, if possible, with a minimum of 2-3 hours at a time -Will attempt to maintain euvolemia to a net negative fluid status -IF patient deteriorates, will consult PCCM and ID  Depression -Continue home medications  [redacted] weeks gestation of pregnancy -Management per OB/GYN primary team  Diarrhea: Most likely due to COVID-19 infection. -Check C. difficile, if it is negative may start as needed Imodium.   Thank you for this consultation.  Our [facility] hospitalist team will follow the patient with you.   Time Spent:  35 min  Lorretta Harp M.D. [facility] 02/24/2020, 3:40 PM

## 2020-02-24 NOTE — Discharge Summary (Signed)
Obstetrical Discharge Summary  Patient Name: Sue Hernandez DOB: 11/12/1980 MRN: [id number]  Date of Admission: 02/24/2020 Date of Delivery: 02/24/20 Delivered by: Beverly Gust MD Date of Discharge:02/28/20 Primary OB: [facility]  [id number] last menstrual period was 02/26/2019 (exact date). EDC Estimated Date of Delivery: 03/07/20 Gestational Age at Delivery: [redacted]w[redacted]d   Antepartum complications: + COVID Admitting Diagnosis: Bilateral pneumonia , previous c/s  Secondary Diagnosis: Patient Active Problem List   Diagnosis Date Noted  . Post-operative state 02/25/2020  . Pneumonia due to COVID-19 virus 02/24/2020  . Diarrhea 02/24/2020  . Pneumonia complicating pregnancy 02/24/2020  . Indication for care in labor and delivery, antepartum 10/06/2016  . Indication for care in labor or delivery 10/06/2016  . Polyhydramnios affecting pregnancy 09/28/2016  . Obesity (BMI 30-39.9) 08/30/2012  . Depression 08/30/2012  . Migraine 08/30/2012    Augmentation: n/a Complications: None Intrapartum complications/course: given her worsening pulmonary issues a repeat LTCS was performed so as to improve her respiratory status Date of Delivery: 08/03/21Delivered By: Beverly Gust MD Delivery Type: repeat cesarean section, low transverse incision Anesthesia: spinal  Placenta: manual Laceration:  Episiotomy: none Newborn Data:TOB 2024, female APGARS 9/9 weight 3640 gm .   Postpartum Procedures: Followed by hospitalist for Covid + pneumonia, see their notes for further details  Edinburgh:  Edinburgh Postnatal Depression Scale Screening Tool 02/27/2020  I have been able to laugh and see the funny side of things. 0  I have looked forward with enjoyment to things. 0  I have blamed myself unnecessarily when things went wrong. 1  I have been anxious or worried for no good reason. 1  I have felt scared or panicky for no good reason. 1  Things have been getting on top of me. 1  I have  been so unhappy that I have had difficulty sleeping. 0  I have felt sad or miserable. 0  I have been so unhappy that I have been crying. 0  The thought of harming myself has occurred to me. 0  Edinburgh Postnatal Depression Scale Total 4      Patient had an uncomplicated postpartum course.  By time of discharge on POD#4, her pain was controlled on oral pain medications; she had appropriate lochia and was ambulating, voiding without difficulty, tolerating regular diet and passing flatus.   She was deemed stable for discharge to home.    Discharge Physical Exam:  BP 125/63   Pulse 71   Temp 98.4 F (36.9 C) (Oral)   Resp 18   Ht 5\' 3"  (1.6 m)   Wt 117.9 kg   LMP 02/26/2019 (Exact Date)   SpO2 90%   Breastfeeding Unknown   BMI 46.06 kg/m   General: NAD CV: RRR Pulm: nl effort ABD: s/nd/nt, fundus firm and below the umbilicus Lochia: moderate Incision: c/d/i DVT Evaluation: LE non-ttp, no evidence of DVT on exam.  Hemoglobin  Date Value Ref Range Status  02/28/2020 9.1 (L) 12.0 - 15.0 g/dL Final   HGB  Date Value Ref Range Status  09/23/2013 12.6 12.0 - 16.0 g/dL Final   HCT  Date Value Ref Range Status  02/28/2020 29.3 (L) 36 - 46 % Final  09/23/2013 37.5 35.0 - 47.0 % Final     Disposition: stable, discharge to home. Baby Feeding: formula Baby Disposition: home with mom  Rh Immune globulin given: n/a Rubella vaccine given: n/a Tdap vaccine given in AP or PP setting: Declined Flu vaccine given in AP or PP setting: n/a  Contraception:  Nuvaring, wants husband to get vasectomy, if not and she has c/s would like tubal (discussed 02/10/20)  Prenatal Labs:  Blood type/Rh A pos  Antibody screen neg  Rubella Immune  Varicella Immune  RPR NR  HBsAg Neg  HIV NR  GC neg  Chlamydia neg  Genetic screening Declined  1 hour GTT 103  3 hour GTT   GBS Pos      Plan:  Bascom Levels was discharged to home in good condition. Follow-up appointment with  delivering provider in 6 weeks.  Discharge Medications: Allergies as of 02/28/2020      Reactions   Ibuprofen Swelling   Swelling of face and hands   Derma Guard [protective Adhesive Powder]    Dermabond - urticaria      Medication List    STOP taking these medications   aspirin 81 MG EC tablet   cefdinir 300 MG capsule Commonly known as: OMNICEF   fluconazole 150 MG tablet Commonly known as: DIFLUCAN     TAKE these medications   amoxicillin-clavulanate 875-125 MG tablet Commonly known as: Augmentin Take 1 tablet by mouth every 12 (twelve) hours for 7 days.   ascorbic acid 500 MG tablet Commonly known as: VITAMIN C Take 1 tablet (500 mg total) by mouth daily.   cyclobenzaprine 5 MG tablet Commonly known as: FLEXERIL Take 5 mg by mouth at bedtime.   escitalopram 10 MG tablet Commonly known as: LEXAPRO Take 10 mg by mouth daily.   predniSONE 10 MG (21) Tbpk tablet Commonly known as: STERAPRED UNI-PAK 21 TAB Start 60 mg po daily, taper 10 mg po daily until done   prenatal multivitamin Tabs tablet Take 1 tablet by mouth daily at 12 noon.   zinc sulfate 220 (50 Zn) MG capsule Take 1 capsule (220 mg total) by mouth daily.            Durable Medical Equipment  (From admission, onward)         Start     Ordered   02/28/20 0756  For home use only DME oxygen  Once       Question Answer Comment  Length of Need 6 Months   Mode or (Route) Nasal cannula   Liters per Minute 2   Frequency Continuous (stationary and portable oxygen unit needed)   Oxygen conserving device Yes   Oxygen delivery system Gas      02/28/20 0755           Follow-up Information    Allegra Grana, FNP. Schedule an appointment as soon as possible for a visit in 1 week(s).   Specialty: Family Medicine Contact information: [address] [place]ure[place]s [address] [place] [address] [phone]        Erin Fulling, MD. Schedule an appointment as soon as possible for a visit in 2  week(s).   Specialties: Pulmonary Disease, Cardiology Contact information: [address] [place] [address] [phone]        Schermerhorn, Ihor Austin, MD. Schedule an appointment as soon as possible for a visit in 1 week(s).   Specialty: Obstetrics and Gynecology Why: post op appt Contact information: [address] [place] [address] [phone]               Signed: Cyril Mourning 02/28/2020 12:49 PM

## 2020-02-24 NOTE — ED Notes (Signed)
Pt provided pillows for comfort.

## 2020-02-24 NOTE — ED Notes (Signed)
Pt taken to xray. Will draw blood when pt returns.  

## 2020-02-24 NOTE — ED Triage Notes (Signed)
Pt here from home- [redacted] weeks pregnant with EDD of 8/15 G2 P1. Pt reports that she was diagnosed with covid last week, states the past couple days her sx's have worsened and she is having more difficulty breathing. Pt sees Dr Dalbert Garnet for [facility] care. Pt denies any OB issues.

## 2020-02-24 NOTE — Brief Op Note (Signed)
02/24/2020  9:52 PM  PATIENT:  Sue Hernandez  39 y.o. female  PRE-OPERATIVE DIAGNOSIS:  Pneumonia, 38+2 weeks , + covid  POST-OPERATIVE DIAGNOSIS:  Pneumonia , 38+2 weeks + covid  PROCEDURE:  Procedure(s): CESAREAN SECTION (N/A)  SURGEON:  Surgeon(s) and Role:    * Elayjah Chaney, Ihor Austin, MD - Primary  PHYSICIAN ASSISTANT: Haroldine Laws   ASSISTANTS: none   ANESTHESIA:  Spinal  EBL:  600 cc IOF 1000 cc BLOOD ADMINISTERED:none  DRAINS: Urinary Catheter (Foley)   LOCAL MEDICATIONS USED:  MARCAINE   , BUPIVICAINE  and LIDOCAINE   SPECIMEN:  Source of Specimen:  placenta  DISPOSITION OF SPECIMEN:  PATHOLOGY  COUNTS:  YES  TOURNIQUET:  * No tourniquets in log *  DICTATION: .Other Dictation: Dictation Number verbal  PLAN OF CARE: Admit to inpatient   PATIENT DISPOSITION:  PACU - hemodynamically stable.   Delay start of Pharmacological VTE agent (>24hrs) due to surgical blood loss or risk of bleeding: not applicable

## 2020-02-24 NOTE — ED Notes (Signed)
Pt did NOT desat after walking from toilet to bed. Pt on room air. RR increased but no increased WOB noted. A&O. Feeling baby move. No pregnancy complaints. Symptoms of SOB, cough, fever at night started last Tuesday, dx with COVID last Wednesday.

## 2020-02-24 NOTE — ED Notes (Signed)
Pt ambulatory to toilet to urinate.  

## 2020-02-24 NOTE — H&P (Signed)
OB History & Physical   History of Present Illness:  Chief Complaint:   HPI:  Sue Hernandez is a 39 y.o. G2P0 female at [redacted]w[redacted]d dated by LMP.  She presents to L&D with pneumonia d/t covid  She reports:  -active fetal movement -no leakage of fluid -no vaginal bleeding -no contractions  Pregnancy Issues: 1. Previous C/S x1 2. H/o Polyhydramnios- G1 3. H/o Depression and Bulimia 4. H/o Gastric sleeve 5. Migraines a. Stopped taking Propranolol b. Taking Magnesium and Melatonin 6. Prepregnancy BMI > 40 7. AMA 8. Anemia   Maternal Medical History:   Past Medical History:  Diagnosis Date  . Depression    Dr. Ardelle Anton  . Eating disorder    bulemia  . Migraines     Past Surgical History:  Procedure Laterality Date  . CESAREAN SECTION N/A 10/07/2016   Procedure: CESAREAN SECTION;  Surgeon: Christeen Douglas, MD;  Location: [facility] ORS;  Service: Obstetrics;  Laterality: N/A;  . CHOLECYSTECTOMY  2009  . LAPAROSCOPIC GASTRIC BANDING  2009   Dr. Terrill Mohr  . LAPAROSCOPIC GASTRIC SLEEVE RESECTION  01/2013  . TONSILLECTOMY  2006  . UPPER GI ENDOSCOPY  2014   hiatial hernia, Dr. Smitty Cords    Allergies  Allergen Reactions  . Ibuprofen Swelling    Swelling of face and hands  . Derma Guard [Protective Adhesive Powder]     Dermabond - urticaria    Prior to Admission medications   Medication Sig Start Date End Date Taking? Authorizing Provider  aspirin 81 MG EC tablet Take by mouth. 09/25/19 09/24/20 Yes [provider]  cyclobenzaprine (FLEXERIL) 5 MG tablet Take 5 mg by mouth at bedtime.   Yes [provider]  escitalopram (LEXAPRO) 10 MG tablet Take 10 mg by mouth daily.   Yes [provider]  fluconazole (DIFLUCAN) 150 MG tablet Take 150 mg by mouth daily.   Yes [provider]  Prenatal Vit-Fe Fumarate-FA (PRENATAL MULTIVITAMIN) TABS tablet Take 1 tablet by mouth daily at 12 noon.   Yes [provider]  cefdinir (OMNICEF) 300 MG capsule  Take 300 mg by mouth 2 (two) times daily. Patient not taking: Reported on 02/24/2020    [provider]     Prenatal care site: [facility]   Social History: She  reports that she has never smoked. She has never used smokeless tobacco. She reports that she does not drink alcohol and does not use drugs.  Family History: family history includes Cancer in her maternal grandfather, maternal grandmother, paternal grandfather, and paternal grandmother; Heart disease in her paternal grandfather.   Review of Systems: A full review of systems was performed and negative except as noted in the HPI.    Physical Exam:  Vital Signs: BP (!) 147/85 (BP Location: Left Arm)   Pulse (!) 105   Temp 98.1 F (36.7 C) (Oral)   Resp (!) 25   Ht 5\' 3"  (1.6 m)   Wt 117.9 kg   LMP 02/26/2019 (Exact Date)   SpO2 96%   BMI 46.06 kg/m   General:   alert and cooperative  Skin:  normal  Neurologic:    Alert & oriented x 3  Lungs:   tachypnea   Heart:   S1, S2 normal  Abdomen:  soft, non-tender; bowel sounds normal; no masses,  no organomegaly  Pelvis:  Exam deferred.  Extremities: : non-tender, symmetric     Results for orders placed or performed during the hospital encounter of 02/24/20 (from the  past 24 hour(s))  CBC with Differential     Status: Abnormal   Collection Time: 02/24/20 10:49 AM  Result Value Ref Range   WBC 9.7 4.0 - 10.5 K/uL   RBC 4.36 3.87 - 5.11 MIL/uL   Hemoglobin 11.1 (L) 12.0 - 15.0 g/dL   HCT 00.9 (L) 36 - 46 %   MCV 78.4 (L) 80.0 - 100.0 fL   MCH 25.5 (L) 26.0 - 34.0 pg   MCHC 32.5 30.0 - 36.0 g/dL   RDW 23.3 (H) 00.7 - 62.2 %   Platelets 165 150 - 400 K/uL   nRBC 0.4 (H) 0.0 - 0.2 %   Neutrophils Relative % 83 %   Neutro Abs 8.0 (H) 1.7 - 7.7 K/uL   Lymphocytes Relative 9 %   Lymphs Abs 0.9 0.7 - 4.0 K/uL   Monocytes Relative 4 %   Monocytes Absolute 0.4 0 - 1 K/uL   Eosinophils Relative 0 %   Eosinophils Absolute 0.0 0 - 0 K/uL   Basophils  Relative 0 %   Basophils Absolute 0.0 0 - 0 K/uL   Immature Granulocytes 4 %   Abs Immature Granulocytes 0.42 (H) 0.00 - 0.07 K/uL  Comprehensive metabolic panel     Status: Abnormal   Collection Time: 02/24/20 10:49 AM  Result Value Ref Range   Sodium 136 135 - 145 mmol/L   Potassium 3.6 3.5 - 5.1 mmol/L   Chloride 107 98 - 111 mmol/L   CO2 18 (L) 22 - 32 mmol/L   Glucose, Bld 98 70 - 99 mg/dL   BUN 6 6 - 20 mg/dL   Creatinine, Ser 6.33 0.44 - 1.00 mg/dL   Calcium 8.0 (L) 8.9 - 10.3 mg/dL   Total Protein 5.9 (L) 6.5 - 8.1 g/dL   Albumin 2.5 (L) 3.5 - 5.0 g/dL   AST 45 (H) 15 - 41 U/L   ALT 24 0 - 44 U/L   Alkaline Phosphatase 164 (H) 38 - 126 U/L   Total Bilirubin 0.6 0.3 - 1.2 mg/dL   GFR calc non Af Amer >60 >60 mL/min   GFR calc Af Amer >60 >60 mL/min   Anion gap 11 5 - 15  Brain natriuretic peptide     Status: None   Collection Time: 02/24/20 10:49 AM  Result Value Ref Range   B Natriuretic Peptide 66.4 0.0 - 100.0 pg/mL  Magnesium     Status: None   Collection Time: 02/24/20 10:49 AM  Result Value Ref Range   Magnesium 1.9 1.7 - 2.4 mg/dL    Pertinent Results:  Prenatal Labs: Blood type/Rh A pos  Antibody screen neg  Rubella Immune  Varicella Immune  RPR NR  HBsAg Neg  HIV NR  GC neg  Chlamydia neg  Genetic screening Declined  1 hour GTT 103  3 hour GTT   GBS Pos   FHT: FHR: 150 bpm, variability: moderate,  accelerations:  Abscent,  decelerations:  Absent Category/reactivity:  Category I TOCO: none SVE:   /   /       DG Chest 2 View  Result Date: 02/24/2020 CLINICAL DATA:  Tachycardia. Additional history provided: Patient [redacted] weeks pregnant, reportedly diagnosed with COVID last week, difficulty breathing. EXAM: CHEST - 2 VIEW COMPARISON:  Prior chest radiographs 07/25/2012 FINDINGS: Heart size within normal limits. There are fairly extensive multifocal airspace opacities bilaterally with a mid to lower lung predominance. No evidence of pleural effusion or  pneumothorax. Incidentally noted azygos fissure. No acute  bony abnormality identified. IMPRESSION: Fairly extensive bilateral multifocal airspace opacities with a mid to lower lung predominance. These findings likely reflect multifocal pneumonia given the provided history of COVID positivity. Electronically Signed   By: Jackey Loge DO   On: 02/24/2020 10:38   US Venous Img Lower Bilateral (DVT)  Result Date: 02/24/2020 CLINICAL DATA:  Tachycardia, shortness of breath. EXAM: Bilateral LOWER EXTREMITY VENOUS DOPPLER ULTRASOUND TECHNIQUE: Gray-scale sonography with compression, as well as color and duplex ultrasound, were performed to evaluate the deep venous system(s) from the level of the common femoral vein through the popliteal and proximal calf veins. COMPARISON:  None. FINDINGS: VENOUS Normal compressibility of the common femoral, superficial femoral, and popliteal veins, as well as the visualized calf veins. Visualized portions of profunda femoral vein and great saphenous vein unremarkable. No filling defects to suggest DVT on grayscale or color Doppler imaging. Doppler waveforms show normal direction of venous flow, normal respiratory plasticity and response to augmentation. Limited views of the contralateral common femoral vein are unremarkable. OTHER None. Limitations: none IMPRESSION: No evidence of deep venous thrombosis seen in either lower extremity. Electronically Signed   By: Lupita Raider M.D.   On: 02/24/2020 11:54     Assessment:  Sue Hernandez is a 39 y.o. G2P0 female at [redacted]w[redacted]d with Pneumonia.   Plan:  1. Admit to Labor & Delivery; consents reviewed and obtained  2. Fetal Well being  - Fetal Tracing: Cat I - GBSpos  3. Routine OB: - Prenatal labs reviewed, as above - Rh pos - CBC & T&S on admit - Clear fluids, IVF  4. Planned C/s  5. Post Partum Planning: - Infant feeding: Breastfeeding - Contraception: Letitia Libra, CNM 02/24/2020 6:33 PM

## 2020-02-24 NOTE — Consult Note (Signed)
Remdesivir - Pharmacy Brief Note   O:  ALT: 24 CXR: Findings consistent with multifocal pneumonia SpO2: 94-96% on RA   A/P:  02/18/20 SARS-CoV-2 PCR positive  Per chart, patient is [redacted] weeks pregnant. There is little data regarding safety and efficacy of remdesivir in pregnant patients. However, based on limited data, remdesivir appears to be well tolerated in 2nd/3rd trimesters.   Remdesivir 200 mg IVPB once followed by 100 mg IVPB daily x 4 days.   Tressie Ellis 02/24/2020 1:22 PM

## 2020-02-24 NOTE — ED Notes (Signed)
Pt provided w lunch tray.

## 2020-02-24 NOTE — Progress Notes (Signed)
Patient ID: Sue Hernandez, female   DOB: March 13, 1981, 39 y.o.   MRN: [id number] Pt [facility] G2P1 at [facility] with known covid for the last week with worsening SOB . + fever and now bilateral pneumonia . O2 sat between 94-96 on Room air  Pt has received Remdesivir and solumedrol . Given her status has worsened over the past few day and after speaking with Dr Judeth Cornfield , MFM [facility], we have decided to proceed on with repeat LTCS . Risks of waiting may allow her to further deteriorate . Delivery of baby should also allow for better respiratory effort for mother . After discussing these issues with mother she agrees to the repeat LTCS . Risks of the procedure have been discussed with the pt . All questions answered . I will have the Hospitalist service continue to co manage

## 2020-02-24 NOTE — Anesthesia Preprocedure Evaluation (Signed)
Anesthesia Evaluation  Patient identified by MRN, date of birth, ID band Patient awake    Reviewed: Allergy & Precautions, H&P , NPO status , Patient's Chart, lab work & pertinent test results  History of Anesthesia Complications Negative for: history of anesthetic complications  Airway Mallampati: III  TM Distance: >3 FB     Dental  (+) Teeth Intact   Pulmonary pneumonia, unresolved,  Covid PNA   breath sounds clear to auscultation       Cardiovascular Exercise Tolerance: Good (-) hypertensionnegative cardio ROS   Rhythm:regular Rate:Normal     Neuro/Psych  Headaches, PSYCHIATRIC DISORDERS Depression    GI/Hepatic H/o gastric bypass   Endo/Other    Renal/GU   negative genitourinary   Musculoskeletal   Abdominal   Peds  Hematology negative hematology ROS (+)   Anesthesia Other Findings Past Medical History: No date: Depression     Comment:  Dr. Ardelle Anton No date: Eating disorder     Comment:  bulemia No date: Migraines  Past Surgical History: 10/07/2016: CESAREAN SECTION; N/A     Comment:  Procedure: CESAREAN SECTION;  Surgeon: Christeen Douglas,               MD;  Location: [facility] ORS;  Service: Obstetrics;                Laterality: N/A; 2009: CHOLECYSTECTOMY 2009: LAPAROSCOPIC GASTRIC BANDING     Comment:  Dr. Terrill Mohr 01/2013: LAPAROSCOPIC GASTRIC SLEEVE RESECTION 2006: TONSILLECTOMY 2014: UPPER GI ENDOSCOPY     Comment:  hiatial hernia, Dr. Smitty Cords  BMI    Body Mass Index: 46.06 kg/m      Reproductive/Obstetrics (+) Pregnancy                             Anesthesia Physical Anesthesia Plan  ASA: III  Anesthesia Plan: Spinal   Post-op Pain Management:    Induction:   PONV Risk Score and Plan:   Airway Management Planned:   Additional Equipment:   Intra-op Plan:   Post-operative Plan:   Informed Consent: I have reviewed the patients History and Physical, chart,  labs and discussed the procedure including the risks, benefits and alternatives for the proposed anesthesia with the patient or authorized representative who has indicated his/her understanding and acceptance.     Dental Advisory Given  Plan Discussed with: Anesthesiologist  Anesthesia Plan Comments:         Anesthesia Quick Evaluation

## 2020-02-24 NOTE — ED Provider Notes (Signed)
[facility] Emergency Department Provider Note  ____________________________________________   First MD Initiated Contact with Patient 02/24/20 1002     (approximate)  I have reviewed the triage vital signs and the nursing notes.   HISTORY  Chief Complaint Shortness of Breath, Cough, and Diarrhea   HPI Sue Hernandez is a 39 y.o. female G2P1 [redacted] weeks pregnant and diagnosed with Covid on 7/27 who presents for assessment of worsening shortness of breath, fatigue, palpitations, cough, diarrhea, myalgias, and fevers.  Patient states her shortness of breath in particular has gotten worse over the last 48 hours.  She denies any hemoptysis, ear pain, eye pain, abdominal pain, vaginal bleeding, vaginal discharge, dysuria, blood in her stool, blood in her urine, rash, or recent injuries.  States pregnancy has otherwise been uncomplicated thus far.  No prior similar episodes.  Other than Tylenol no clear alleviating or aggravating factors.         Past Medical History:  Diagnosis Date  . Depression    Dr. Ardelle Anton  . Eating disorder    bulemia  . Migraines     Patient Active Problem List   Diagnosis Date Noted  . Pneumonia due to COVID-19 virus 02/24/2020  . [redacted] weeks gestation of pregnancy 02/24/2020  . Indication for care in labor and delivery, antepartum 10/06/2016  . Indication for care in labor or delivery 10/06/2016  . Polyhydramnios affecting pregnancy 09/28/2016  . Obesity (BMI 30-39.9) 08/30/2012  . Depression 08/30/2012  . Migraine 08/30/2012    Past Surgical History:  Procedure Laterality Date  . CESAREAN SECTION N/A 10/07/2016   Procedure: CESAREAN SECTION;  Surgeon: Christeen Douglas, MD;  Location: [facility] ORS;  Service: Obstetrics;  Laterality: N/A;  . CHOLECYSTECTOMY  2009  . LAPAROSCOPIC GASTRIC BANDING  2009   Dr. Terrill Mohr  . LAPAROSCOPIC GASTRIC SLEEVE RESECTION  01/2013  . TONSILLECTOMY  2006  . UPPER GI ENDOSCOPY  2014   hiatial  hernia, Dr. Smitty Cords    Prior to Admission medications   Medication Sig Start Date End Date Taking? Authorizing Provider  B Complex Vitamins (B COMPLEX PO) Take by mouth.    [provider]  BIOTIN PO Take by mouth.    [provider]  buPROPion (WELLBUTRIN XL) 150 MG 24 hr tablet Take by mouth. 11/12/18   [provider]  escitalopram (LEXAPRO) 10 MG tablet Take 10 mg by mouth daily.    [provider]  escitalopram (LEXAPRO) 20 MG tablet TAKE 1 TABLET (20 MG TOTAL) BY MOUTH DAILY. Patient not taking: Reported on 10/23/2019 01/17/16   Shelia Media, MD  Prenatal Vit-Fe Fumarate-FA (PRENATAL MULTIVITAMIN) TABS tablet Take 1 tablet by mouth daily at 12 noon.    [provider]    Allergies Ibuprofen and Derma guard [protective adhesive powder]  Family History  Problem Relation Age of Onset  . Cancer Maternal Grandmother        lung  . Cancer Paternal Grandmother        Breast  . Heart disease Paternal Grandfather   . Cancer Paternal Grandfather        renal cancer went to his lungs  . Cancer Maternal Grandfather        lung    Social History Social History   Tobacco Use  . Smoking status: Never Smoker  . Smokeless tobacco: Never Used  Substance Use Topics  . Alcohol use: No    Comment: Maybe once a month  . Drug use: No  Review of Systems  Review of Systems  Constitutional: Positive for chills, fever and malaise/fatigue.  HENT: Negative for sore throat.   Eyes: Negative for pain.  Respiratory: Positive for cough and shortness of breath. Negative for stridor.   Cardiovascular: Negative for chest pain, orthopnea and leg swelling.  Gastrointestinal: Positive for diarrhea and nausea. Negative for vomiting.  Musculoskeletal: Positive for myalgias.  Skin: Negative for rash.  Neurological: Negative for seizures, loss of consciousness and headaches.  Psychiatric/Behavioral: Negative for suicidal ideas.  All other systems  reviewed and are negative.     ____________________________________________   PHYSICAL EXAM:  VITAL SIGNS: ED Triage Vitals  Enc Vitals Group     BP 02/24/20 0957 128/84     Pulse Rate 02/24/20 0957 (!) 123     Resp 02/24/20 0957 (!) 24     Temp 02/24/20 0957 97.9 F (36.6 C)     Temp Source 02/24/20 0957 Oral     SpO2 02/24/20 0957 96 %     Weight 02/24/20 0959 260 lb (117.9 kg)     Height 02/24/20 0959 5\' 3"  (1.6 m)     Head Circumference --      Peak Flow --      Pain Score 02/24/20 0959 0     Pain Loc --      Pain Edu? --      Excl. in GC? --    Vitals:   02/24/20 0957 02/24/20 1055  BP: 128/84   Pulse: (!) 123 (!) 124  Resp: (!) 24 (!) 24  Temp: 97.9 F (36.6 C)   SpO2: 96% 96%   Physical Exam Vitals and nursing note reviewed.  Constitutional:      Appearance: She is well-developed. She is ill-appearing.  HENT:     Head: Normocephalic and atraumatic.     Right Ear: External ear normal.     Left Ear: External ear normal.     Nose: Nose normal.  Eyes:     Conjunctiva/sclera: Conjunctivae normal.  Cardiovascular:     Rate and Rhythm: Regular rhythm. Tachycardia present.     Heart sounds: No murmur heard.   Pulmonary:     Effort: Tachypnea and accessory muscle usage present. No respiratory distress.     Breath sounds: Rhonchi present.  Abdominal:     Palpations: Abdomen is soft.     Tenderness: There is no abdominal tenderness.  Musculoskeletal:     Cervical back: Neck supple.  Skin:    General: Skin is warm and dry.     Capillary Refill: Capillary refill takes less than 2 seconds.  Neurological:     General: No focal deficit present.     Mental Status: She is alert and oriented to person, place, and time.  Psychiatric:        Mood and Affect: Mood normal.     Patient's abdomen is gravid. ____________________________________________   LABS (all labs ordered are listed, but only abnormal results are displayed)  Labs Reviewed  CBC WITH  DIFFERENTIAL/PLATELET - Abnormal; Notable for the following components:      Result Value   Hemoglobin 11.1 (*)    HCT 34.2 (*)    MCV 78.4 (*)    MCH 25.5 (*)    RDW 15.9 (*)    nRBC 0.4 (*)    Neutro Abs 8.0 (*)    Abs Immature Granulocytes 0.42 (*)    All other components within normal limits  COMPREHENSIVE METABOLIC PANEL - Abnormal; Notable for the  following components:   CO2 18 (*)    Calcium 8.0 (*)    Total Protein 5.9 (*)    Albumin 2.5 (*)    AST 45 (*)    Alkaline Phosphatase 164 (*)    All other components within normal limits  BRAIN NATRIURETIC PEPTIDE  MAGNESIUM   ____________________________________________  EKG  Sinus tachycardia at a rate of 136, normal axis, normal intervals, no evidence of acute ischemia or significant underlying arrhythmia. ____________________________________________  RADIOLOGY   Official radiology report(s): DG Chest 2 View  Result Date: 02/24/2020 CLINICAL DATA:  Tachycardia. Additional history provided: Patient [redacted] weeks pregnant, reportedly diagnosed with COVID last week, difficulty breathing. EXAM: CHEST - 2 VIEW COMPARISON:  Prior chest radiographs 07/25/2012 FINDINGS: Heart size within normal limits. There are fairly extensive multifocal airspace opacities bilaterally with a mid to lower lung predominance. No evidence of pleural effusion or pneumothorax. Incidentally noted azygos fissure. No acute bony abnormality identified. IMPRESSION: Fairly extensive bilateral multifocal airspace opacities with a mid to lower lung predominance. These findings likely reflect multifocal pneumonia given the provided history of COVID positivity. Electronically Signed   By: Jackey LogeKyle  Golden DO   On: 02/24/2020 10:38   US Venous Img Lower Bilateral (DVT)  Result Date: 02/24/2020 CLINICAL DATA:  Tachycardia, shortness of breath. EXAM: Bilateral LOWER EXTREMITY VENOUS DOPPLER ULTRASOUND TECHNIQUE: Gray-scale sonography with compression, as well as color and  duplex ultrasound, were performed to evaluate the deep venous system(s) from the level of the common femoral vein through the popliteal and proximal calf veins. COMPARISON:  None. FINDINGS: VENOUS Normal compressibility of the common femoral, superficial femoral, and popliteal veins, as well as the visualized calf veins. Visualized portions of profunda femoral vein and great saphenous vein unremarkable. No filling defects to suggest DVT on grayscale or color Doppler imaging. Doppler waveforms show normal direction of venous flow, normal respiratory plasticity and response to augmentation. Limited views of the contralateral common femoral vein are unremarkable. OTHER None. Limitations: none IMPRESSION: No evidence of deep venous thrombosis seen in either lower extremity. Electronically Signed   By: Lupita RaiderJames  Green Jr M.D.   On: 02/24/2020 11:54    ____________________________________________   PROCEDURES  Procedure(s) performed (including Critical Care):  .1-3 Lead EKG Interpretation Performed by: Gilles ChiquitoSmith, Kamdin Follett P, MD Authorized by: Gilles ChiquitoSmith, Franceen Erisman P, MD     Interpretation: abnormal     ECG rate assessment: tachycardic     Rhythm: sinus tachycardia     Ectopy: none     Conduction: normal       ____________________________________________   INITIAL IMPRESSION / ASSESSMENT AND PLAN / ED COURSE        Overall patient's history, exam, and ED work-up is concerning for tachypnea, tachycardia, and shortness of breath as well as other constellation of symptoms noted above likely secondary to COVID-19 pneumonia.  Patient is noted to be mildly anemic but this is appropriate for being [redacted] weeks pregnant and some dilutional anemia.  There are no significant metabolic derangements noted on CMP aside from a bicarb of 18 with anion gap of 11.  Likely secondary to very mild dehydration.  Low suspicion for significant volume overload or pulmonary edema as BNP is not elevated patient has no significant edema  on exam.  Presentation is not consistent with dissection or or ACS have low suspicion for PE given no evidence of DVT on bilateral lower extremities and overall presentation most consistent with progressive Covid pneumonia.  Medications  lactated ringers bolus 500 mL (has  no administration in time range)  acetaminophen (TYLENOL) tablet 1,000 mg (1,000 mg Oral Given 02/24/20 1052)   On-call OB/GYN service consulted will admit patient for ops.  Hospitalist service consulted to make recommendations regarding coverage treatment.          ____________________________________________   FINAL CLINICAL IMPRESSION(S) / ED DIAGNOSES  Final diagnoses:  Cough  COVID-19  [redacted] weeks gestation of pregnancy     ED Discharge Orders    None       Note:  This document was prepared using Dragon voice recognition software and may include unintentional dictation errors.   Gilles Chiquito, MD 02/24/20 [phone]

## 2020-02-24 NOTE — Transfer of Care (Signed)
Immediate Anesthesia Transfer of Care Note  Patient: Sue Hernandez  Procedure(s) Performed: CESAREAN SECTION (N/A )  Patient Location: Mother/Baby  Anesthesia Type:Spinal  Level of Consciousness: awake, alert  and oriented  Airway & Oxygen Therapy: Patient Spontanous Breathing and Patient connected to nasal cannula oxygen  Post-op Assessment: Report given to RN and Post -op Vital signs reviewed and stable  Post vital signs: Reviewed and stable  Last Vitals:  Vitals Value Taken Time  BP 118/73   Temp 98.3   Pulse 108 02/24/20 2150  Resp 17   SpO2 94 % 02/24/20 2150  Vitals shown include unvalidated device data.  Last Pain:  Vitals:   02/24/20 1754  TempSrc: Oral  PainSc:          Complications: No complications documented.

## 2020-02-24 NOTE — Anesthesia Procedure Notes (Signed)
Spinal  Patient location during procedure: OR Start time: 02/24/2020 8:00 PM End time: 02/24/2020 8:09 PM Staffing Performed: anesthesiologist and resident/CRNA  Anesthesiologist: Tera Mater, MD Resident/CRNA: Caryl Asp, CRNA Preanesthetic Checklist Completed: patient identified, IV checked, site marked, risks and benefits discussed, surgical consent, monitors and equipment checked, pre-op evaluation and timeout performed Spinal Block Patient position: sitting Prep: Betadine Patient monitoring: heart rate, continuous pulse ox, blood pressure and cardiac monitor Approach: midline Location: L3-4 Injection technique: single-shot Needle Needle type: Whitacre and Introducer  Needle gauge: 24 G Needle length: 9 cm Assessment Sensory level: T3 Additional Notes Negative paresthesia. Negative blood return. Positive free-flowing CSF. Expiration date of kit checked and confirmed. Patient tolerated procedure well, without complications.

## 2020-02-25 ENCOUNTER — Encounter: Payer: Self-pay | Admitting: Obstetrics and Gynecology

## 2020-02-25 ENCOUNTER — Inpatient Hospital Stay: Payer: Medicaid Other

## 2020-02-25 DIAGNOSIS — Z9889 Other specified postprocedural states: Secondary | ICD-10-CM

## 2020-02-25 DIAGNOSIS — J189 Pneumonia, unspecified organism: Secondary | ICD-10-CM | POA: Diagnosis not present

## 2020-02-25 DIAGNOSIS — O9853 Other viral diseases complicating the puerperium: Secondary | ICD-10-CM | POA: Diagnosis not present

## 2020-02-25 DIAGNOSIS — Z3A38 38 weeks gestation of pregnancy: Secondary | ICD-10-CM | POA: Diagnosis not present

## 2020-02-25 DIAGNOSIS — O9081 Anemia of the puerperium: Secondary | ICD-10-CM | POA: Diagnosis not present

## 2020-02-25 DIAGNOSIS — U071 COVID-19: Secondary | ICD-10-CM

## 2020-02-25 DIAGNOSIS — F329 Major depressive disorder, single episode, unspecified: Secondary | ICD-10-CM

## 2020-02-25 DIAGNOSIS — J1282 Pneumonia due to coronavirus disease 2019: Secondary | ICD-10-CM | POA: Diagnosis not present

## 2020-02-25 DIAGNOSIS — A09 Infectious gastroenteritis and colitis, unspecified: Secondary | ICD-10-CM | POA: Diagnosis not present

## 2020-02-25 DIAGNOSIS — O99519 Diseases of the respiratory system complicating pregnancy, unspecified trimester: Secondary | ICD-10-CM

## 2020-02-25 LAB — CBC WITH DIFFERENTIAL/PLATELET
Abs Immature Granulocytes: 0.28 10*3/uL — ABNORMAL HIGH (ref 0.00–0.07)
Basophils Absolute: 0 10*3/uL (ref 0.0–0.1)
Basophils Relative: 0 %
Eosinophils Absolute: 0 10*3/uL (ref 0.0–0.5)
Eosinophils Relative: 0 %
HCT: 28.5 % — ABNORMAL LOW (ref 36.0–46.0)
Hemoglobin: 9.4 g/dL — ABNORMAL LOW (ref 12.0–15.0)
Immature Granulocytes: 2 %
Lymphocytes Relative: 8 %
Lymphs Abs: 1 10*3/uL (ref 0.7–4.0)
MCH: 25.6 pg — ABNORMAL LOW (ref 26.0–34.0)
MCHC: 33 g/dL (ref 30.0–36.0)
MCV: 77.7 fL — ABNORMAL LOW (ref 80.0–100.0)
Monocytes Absolute: 0.5 10*3/uL (ref 0.1–1.0)
Monocytes Relative: 4 %
Neutro Abs: 10.7 10*3/uL — ABNORMAL HIGH (ref 1.7–7.7)
Neutrophils Relative %: 86 %
Platelets: 161 10*3/uL (ref 150–400)
RBC: 3.67 MIL/uL — ABNORMAL LOW (ref 3.87–5.11)
RDW: 16 % — ABNORMAL HIGH (ref 11.5–15.5)
WBC: 12.5 10*3/uL — ABNORMAL HIGH (ref 4.0–10.5)
nRBC: 0.2 % (ref 0.0–0.2)

## 2020-02-25 LAB — COMPREHENSIVE METABOLIC PANEL
ALT: 22 U/L (ref 0–44)
AST: 44 U/L — ABNORMAL HIGH (ref 15–41)
Albumin: 2.1 g/dL — ABNORMAL LOW (ref 3.5–5.0)
Alkaline Phosphatase: 119 U/L (ref 38–126)
Anion gap: 8 (ref 5–15)
BUN: 7 mg/dL (ref 6–20)
CO2: 19 mmol/L — ABNORMAL LOW (ref 22–32)
Calcium: 8 mg/dL — ABNORMAL LOW (ref 8.9–10.3)
Chloride: 111 mmol/L (ref 98–111)
Creatinine, Ser: 0.77 mg/dL (ref 0.44–1.00)
GFR calc Af Amer: >60 mL/min (ref >60)
GFR calc non Af Amer: >60 mL/min (ref >60)
Glucose, Bld: 114 mg/dL — ABNORMAL HIGH (ref 70–99)
Potassium: 4.6 mmol/L (ref 3.5–5.1)
Sodium: 138 mmol/L (ref 135–145)
Total Bilirubin: 0.4 mg/dL (ref 0.3–1.2)
Total Protein: 5 g/dL — ABNORMAL LOW (ref 6.5–8.1)

## 2020-02-25 LAB — FERRITIN: Ferritin: 52 ng/mL (ref 11–307)

## 2020-02-25 LAB — FIBRIN DERIVATIVES D-DIMER (ARMC ONLY): Fibrin derivatives D-dimer (ARMC): 2084.42 ng/mL (FEU) — ABNORMAL HIGH (ref 0.00–499.00)

## 2020-02-25 LAB — PROCALCITONIN: Procalcitonin: 1.01 ng/mL

## 2020-02-25 MED ORDER — MENTHOL 3 MG MT LOZG
1.0000 | LOZENGE | OROMUCOSAL | Status: DC | PRN
  Administered 2020-02-28: 3 mg via ORAL
  Filled 2020-02-25: qty 9

## 2020-02-25 MED ORDER — ENOXAPARIN SODIUM 40 MG/0.4ML ~~LOC~~ SOLN
40.0000 mg | Freq: Two times a day (BID) | SUBCUTANEOUS | Status: DC
  Filled 2020-02-25: qty 0.4

## 2020-02-25 MED ORDER — FERROUS SULFATE 325 (65 FE) MG PO TABS
325.0000 mg | ORAL_TABLET | Freq: Two times a day (BID) | ORAL | Status: DC
  Administered 2020-02-25 – 2020-02-28 (×7): 325 mg via ORAL
  Filled 2020-02-25 (×7): qty 1

## 2020-02-25 MED ORDER — TETANUS-DIPHTH-ACELL PERTUSSIS 5-2.5-18.5 LF-MCG/0.5 IM SUSP
0.5000 mL | Freq: Once | INTRAMUSCULAR | Status: DC
  Filled 2020-02-25: qty 0.5

## 2020-02-25 MED ORDER — ENOXAPARIN SODIUM 40 MG/0.4ML ~~LOC~~ SOLN
40.0000 mg | Freq: Two times a day (BID) | SUBCUTANEOUS | Status: DC
  Administered 2020-02-25 – 2020-02-27 (×5): 40 mg via SUBCUTANEOUS
  Filled 2020-02-25 (×5): qty 0.4

## 2020-02-25 MED ORDER — PRENATAL MULTIVITAMIN CH
1.0000 | ORAL_TABLET | Freq: Every day | ORAL | Status: DC
  Administered 2020-02-26 – 2020-02-27 (×2): 1 via ORAL
  Filled 2020-02-25 (×2): qty 1

## 2020-02-25 MED ORDER — DIPHENHYDRAMINE HCL 25 MG PO CAPS: 25.0000 mg | ORAL_CAPSULE | Freq: Four times a day (QID) | ORAL | Status: DC | PRN

## 2020-02-25 MED ORDER — SIMETHICONE 80 MG PO CHEW: 80.0000 mg | CHEWABLE_TABLET | ORAL | Status: DC | PRN

## 2020-02-25 MED ORDER — ESCITALOPRAM OXALATE 10 MG PO TABS
10.0000 mg | ORAL_TABLET | Freq: Every day | ORAL | Status: DC
  Administered 2020-02-25 – 2020-02-28 (×4): 10 mg via ORAL
  Filled 2020-02-25 (×4): qty 1

## 2020-02-25 MED ORDER — COCONUT OIL OIL
1.0000 "application " | TOPICAL_OIL | Status: DC | PRN
  Filled 2020-02-25: qty 120

## 2020-02-25 MED ORDER — ACETAMINOPHEN 500 MG PO TABS
1000.0000 mg | ORAL_TABLET | Freq: Four times a day (QID) | ORAL | Status: DC
  Administered 2020-02-26 – 2020-02-27 (×7): 1000 mg via ORAL
  Filled 2020-02-25 (×9): qty 2

## 2020-02-25 MED ORDER — SIMETHICONE 80 MG PO CHEW
80.0000 mg | CHEWABLE_TABLET | Freq: Three times a day (TID) | ORAL | Status: DC
  Administered 2020-02-25 – 2020-02-27 (×9): 80 mg via ORAL
  Filled 2020-02-25 (×10): qty 1

## 2020-02-25 MED ORDER — SIMETHICONE 80 MG PO CHEW
80.0000 mg | CHEWABLE_TABLET | ORAL | Status: DC
  Administered 2020-02-25 – 2020-02-27 (×2): 80 mg via ORAL
  Filled 2020-02-25 (×2): qty 1

## 2020-02-25 MED ORDER — ZOLPIDEM TARTRATE 5 MG PO TABS: 5.0000 mg | ORAL_TABLET | Freq: Every evening | ORAL | Status: DC | PRN

## 2020-02-25 MED ORDER — GABAPENTIN 300 MG PO CAPS
300.0000 mg | ORAL_CAPSULE | Freq: Every day | ORAL | Status: DC
  Administered 2020-02-25 – 2020-02-27 (×4): 300 mg via ORAL
  Filled 2020-02-25 (×6): qty 1

## 2020-02-25 MED ORDER — OXYTOCIN-SODIUM CHLORIDE 30-0.9 UT/500ML-% IV SOLN
2.5000 [IU]/h | INTRAVENOUS | Status: AC
  Administered 2020-02-24 – 2020-02-25 (×2): 2.5 [IU]/h via INTRAVENOUS
  Filled 2020-02-25: qty 500

## 2020-02-25 MED ORDER — SENNOSIDES-DOCUSATE SODIUM 8.6-50 MG PO TABS
2.0000 | ORAL_TABLET | ORAL | Status: DC
  Administered 2020-02-25 – 2020-02-26 (×2): 2 via ORAL
  Filled 2020-02-25 (×3): qty 2

## 2020-02-25 MED ORDER — MORPHINE SULFATE (PF) 2 MG/ML IV SOLN: 1.0000 mg | INTRAVENOUS | Status: DC | PRN

## 2020-02-25 MED ORDER — FUROSEMIDE 10 MG/ML IJ SOLN
40.0000 mg | Freq: Once | INTRAMUSCULAR | Status: AC
  Administered 2020-02-25: 40 mg via INTRAVENOUS
  Filled 2020-02-25 (×2): qty 4

## 2020-02-25 MED ORDER — OXYCODONE HCL 5 MG PO TABS: 5.0000 mg | ORAL_TABLET | ORAL | Status: DC | PRN

## 2020-02-25 MED ORDER — LACTATED RINGERS IV SOLN: INTRAVENOUS | Status: DC

## 2020-02-25 NOTE — Progress Notes (Signed)
Spoke with Dr. Karlene Lineman regarding O2 sats. Per Dr. Sherryll Burger, ok with sats 88-90 or above. Made aware pt  currently requiring 5LPM O2 via nasal canula to maintain sats at 90 to currently 92%. Mancel Parsons, CNM made aware of conversation as well. Will continue to monitor. Sue Hernandez

## 2020-02-25 NOTE — Progress Notes (Addendum)
1        [place] [facility]   PATIENT NAME: Sue Hernandez    MR#:  [id number]  DATE OF BIRTH:  [birth year]  SUBJECTIVE:  CHIEF COMPLAINT:   Chief Complaint  Patient presents with  . Shortness of Breath  . Cough  . Diarrhea  Sitting in the bed, shortness of breath present.  She is optimistic, husband and nurse at bedside. REVIEW OF SYSTEMS:  Review of Systems  Constitutional: Negative for diaphoresis, fever, malaise/fatigue and weight loss.  HENT: Negative for ear discharge, ear pain, hearing loss, nosebleeds, sore throat and tinnitus.   Eyes: Negative for blurred vision and pain.  Respiratory: Positive for cough and shortness of breath. Negative for hemoptysis and wheezing.   Cardiovascular: Negative for chest pain, palpitations, orthopnea and leg swelling.  Gastrointestinal: Negative for abdominal pain, blood in stool, constipation, diarrhea, heartburn, nausea and vomiting.  Genitourinary: Negative for dysuria, frequency and urgency.  Musculoskeletal: Negative for back pain and myalgias.  Skin: Negative for itching and rash.  Neurological: Negative for dizziness, tingling, tremors, focal weakness, seizures, weakness and headaches.  Psychiatric/Behavioral: Negative for depression. The patient is not nervous/anxious.    DRUG ALLERGIES:   Allergies  Allergen Reactions  . Ibuprofen Swelling    Swelling of face and hands  . Derma Guard [Protective Adhesive Powder]     Dermabond - urticaria   VITALS:  Blood pressure 130/75, pulse (!) 57, temperature (!) 97.5 F (36.4 C), temperature source Oral, resp. rate 18, height 5\' 3"  (1.6 m), weight 117.9 kg, last menstrual period 02/26/2019, SpO2 92 %, unknown if currently breastfeeding. PHYSICAL EXAMINATION:  Physical Exam  General: In no acute distress Skin no rashes Neuro alert and oriented x3 Psych: Normal affect, no psychosis  Abdomen: C-section surgical scar and dressing present LABORATORY PANEL:   Female CBC Recent Labs  Lab 02/25/20 0727  WBC 12.5*  HGB 9.4*  HCT 28.5*  PLT 161   ------------------------------------------------------------------------------------------------------------------ Chemistries  Recent Labs  Lab 02/24/20 1049 02/24/20 1049 02/25/20 0727  NA 136   < > 138  K 3.6   < > 4.6  CL 107   < > 111  CO2 18*   < > 19*  GLUCOSE 98   < > 114*  BUN 6   < > 7  CREATININE 0.89   < > 0.77  CALCIUM 8.0*   < > 8.0*  MG 1.9  --   --   AST 45*   < > 44*  ALT 24   < > 22  ALKPHOS 164*   < > 119  BILITOT 0.6   < > 0.4   < > = values in this interval not displayed.   RADIOLOGY:  No results found. ASSESSMENT AND PLAN:  39 year old female with history of depression, migraine, eating disorder seen in consultation for pneumonia from Covid.  Acute hypoxic respiratory failure due to Pneumonia from COVID-19 Patient requiring 4 to 5 L oxygen to maintain saturations above 90% We will give 40 mg of IV Lasix to help diurese in an effort to wean her oxygen Continue remdesivir and steroids day 2/5 Vitamin C and zinc  As needed Mucinex Bronchodilators Daily inflammatory markers, D-dimer 2084 Recommend proning for 16+ hours a day while awake if okay with OB/GYN If clinical condition worsens, consider PCCM and/or ID consultation.  Low threshold for transferring to ICU/stepdown.  Depression Continue home medications  Body mass index is 46.06 kg/m.      Status  is: Inpatient  Remains inpatient appropriate because:Inpatient level of care appropriate due to severity of illness   Dispo: The patient is from: Home              Anticipated d/c is to: Home              Anticipated d/c date is: > 3 days              Patient currently is not medically stable to d/c.   DVT prophylaxis:            enoxaparin (LOVENOX) injection 40 mg Start: 02/25/20 2200 SCD's Start: 02/24/20 2214 SCDs Start: 02/24/20 1652     Family Communication: Updated husband at  bedside   All the records are reviewed and case discussed with Care Management/Social Worker. Management plans discussed with the patient, family (husband at bedside) and they are in agreement.  CODE STATUS: Full Code  TOTAL TIME TAKING CARE OF THIS PATIENT: 25 minutes.   More than 50% of the time was spent in counseling/coordination of care: YES  POSSIBLE D/C IN 3-4 DAYS, DEPENDING ON CLINICAL CONDITION.   Delfino Lovett M.D on 02/25/2020 at 4:54 PM  [facility]   CC: Primary care physician; Allegra Grana, FNP  Note: This dictation was prepared with Dragon dictation along with smaller phrase technology. Any transcriptional errors that result from this process are unintentional.

## 2020-02-25 NOTE — Op Note (Signed)
NAME: Sue Hernandez, Sue Hernandez MEDICAL RECORD OI:[id number] ACCOUNT [id number] DATE OF BIRTH:[birth year] FACILITY: [facility] LOCATION: [facility] PHYSICIAN:Babacar Haycraft Cloyde Reams, MD  OPERATIVE REPORT  DATE OF PROCEDURE:  02/24/2020  PREOPERATIVE DIAGNOSES: 1.  38+2 weeks estimated gestational age. 2.  COVID positive with symptomatic bilateral pneumonia. 3.  Previous cesarean section.  POSTOPERATIVE DIAGNOSES:   1.  38+2 weeks estimated gestational age. 2.  COVID positive with symptomatic bilateral pneumonia. 3.  Previous cesarean section.  PROCEDURE:   1.  Repeat low transverse cesarean section. 2.  Kiwi extraction of fetal head.  ANESTHESIA:  Spinal.  SURGEON:  Jennell Corner, MD  FIRST ASSISTANT:  Haroldine Laws, certified nurse midwife  INDICATIONS:  A 39 year old gravida 2, para 1 at 38+2 weeks estimated gestational age with a history of a prior cesarean section, who presented to [facility] Emergency Department with shortness of breath, fever and cough.  The  patient was diagnosed with COVID on day of admission and bilateral pneumonia documented on chest x-ray.  Given the patient's worsening symptomatology and her near term gestational age, it was decided that a repeat low transverse cesarean section may be  in the patient's best interest to help with her respiratory status.  DESCRIPTION OF PROCEDURE:  After adequate spinal anesthesia, the patient was placed in the dorsal supine position with a hip roll under the right side.  The patient's abdomen was prepped and draped in normal sterile fashion.  Timeout was performed.  The  patient did receive 3 grams IV Ancef for surgical prophylaxis.  A Pfannenstiel incision was made 3 fingerbreadths above the symphysis pubis.  Sharp dissection was used to identify the fascia.  Fascia was opened in the midline in a transverse fashion.   Superior aspect of the fascia was grasped with Kocher clamps and the recti muscles  were dissected free.  Inferior aspect of the fascia was grasped with Kocher clamps and the pyramidalis muscle was dissected free.  Entry into the peritoneal cavity was  accomplished sharply.  There was some lower uterine scar tissue to the anterior abdominal wall, which was taken down with Bovie and sharp dissection.  Bladder flap was created and the bladder was reflected inferiorly.  A low transverse uterine incision  was made upon entry into the endometrial cavity.  Clear fluid resulted.  The uterine incision was extended with blunt transverse traction.  Fetal head was brought to the incision and a Kiwi bell vacuum was applied to the occiput and with 1 gentle pull,  the head was delivered, the vacuum was removed.  The shoulders and body were delivered without difficulty.  A vigorous female was then dried on the patient's abdomen for 60 seconds and then the cord was clamped, transected and vigorous female was passed off  to nursery staff who assigned Apgars scores of 9 and 9.  Time of birth was 2024 on 02/24/2020.  The placenta was then manually delivered and the uterus was exteriorized and the endometrial cavity was wiped clean with laparotomy tape.  Fallopian tubes and  ovaries appeared normal.  The uterine incision was then closed with a 1 chromic suture in a running locking fashion.  Good approximation of edges.  Several additional figure-of-eight sutures were required for good hemostasis.  Posterior cul-de-sac was  irrigated and suctioned and the uterus was placed back into the abdominal cavity and the pericolic gutters were wiped clean with laparotomy tape.  Fascia was then closed with an 0 Vicryl suture, running nonlocking.  Two separate sutures were  used.  The  fascial edges were then injected with a solution of 20 mL of 1.3% Exparel plus 30 mL of 0.5% Marcaine and 50 mL normal saline; 50 mL of the solution was injected at the fascial edges.  Subcutaneous tissues were irrigated and bovied for hemostasis.   Given  the depth of the subcutaneous tissues of 5 cm a 2-0 chromic suture was used to close dead space.  Skin was then reapproximated with Insorb absorbable staples and additional 30 mL of Exparel solution was injected beneath the skin.  COMPLICATIONS:  There were no complications.  ESTIMATED BLOOD LOSS:  600 mL.  INTRAOPERATIVE FLUIDS:  1000 mL.  DISPOSITION:  The patient was taken to recovery room in good condition.  CN/NUANCE  D:02/24/2020 T:02/25/2020 JOB:[id number]

## 2020-02-25 NOTE — Progress Notes (Signed)
PHARMACIST - PHYSICIAN COMMUNICATION  CONCERNING:  Enoxaparin (Lovenox) for DVT Prophylaxis    RECOMMENDATION: Patient was prescribed enoxaprin 40mg  q24 hours for VTE prophylaxis.   Filed Weights   02/24/20 0959  Weight: 117.9 kg (260 lb)    Body mass index is 46.06 kg/m.  Estimated Creatinine Clearance: 107.4 mL/min (by C-G formula based on SCr of 0.89 mg/dL).   Based on [facility] policy patient is candidate for enoxaparin 40mg  every 12 hour dosing due to BMI being >40.  DESCRIPTION: Pharmacy has adjusted enoxaparin dose per [facility] policy.  Patient is now receiving enoxaparin 40mg  every 12 hours.    [facility], PharmD Clinical Pharmacist  02/25/2020 7:02 AM

## 2020-02-25 NOTE — Consult Note (Addendum)
Name: Sue Hernandez MRN: [id number] DOB: [birth year]    ADMISSION DATE:  02/24/2020 CONSULTATION DATE:  02/25/2020  REFERRING MD :  Dr. Sherryll Burger  CHIEF COMPLAINT:  Hypoxia  BRIEF PATIENT DESCRIPTION:  39 y.o. pregnant female ([redacted] weeks gestation) admitted 02/24/20 with Acute Hypoxic Respiratory Failure secondary to COVID-19 Pneumonia (tested positive on 02/18/20).  Underwent C-section on 02/25/20.  PCCM consulted for persistent hypoxia.  SIGNIFICANT EVENTS  8/4: Underwent C-section 8/4: Persistent Hypoxia, PCCM consulted  STUDIES:  8/3: CXR>>Heart size within normal limits. There are fairly extensive multifocal airspace opacities bilaterally with a mid to lower lung predominance. No evidence of pleural effusion or pneumothorax. Incidentally noted azygos fissure. No acute bony abnormality identified. 8/3: Bilateral LE Venous US>>No evidence of deep venous thrombosis seen in either lower extremity. 8/5: CTA Chest>>1. Negative for acute pulmonary embolus. 2. Extensive bilateral consolidations and ground-glass densities throughout the lungs consistent with bilateral pneumonia and history of COVID positivity.  CULTURES: Blood culture x2 8/3>>  ANTIBIOTICS: Remdesivir 8/3>>  HISTORY OF PRESENT ILLNESS:   Sue Hernandez is a 39 year old pregnant female ([redacted] weeks gestation) female with a past medical history significant for depression, migraine headaches, eating disorder who presented to [facility] ED on 02/24/2020 with complaints of cough, shortness of breath, fever, chills.  She reports her symptoms began approximately 2 weeks ago, and have progressively worsened in the last 48 hours.  She also reports testing positive for COVID-19 on 02/18/2020.  She denied chest pain, abdominal pain, nausea, vomiting, dysuria, vaginal bleeding, vaginal discharge.  Initial work-up in the ED revealed WBC 9.7, Bicarb 19, hemoglobin 9.4, BNP 66.4. Chest x-ray showed bilateral multifocal infiltrates.  Venous US of  bilateral lower extremities was negative for DVT. She was admitted to the labor and delivery unit for further work-up and treatment of acute hypoxic respiratory failure in the setting of COVID-19 pneumonia.  On 02/25/2020 she underwent C-section.  PCCM is consulted due to persistent hypoxia and increasing FiO2 requirements on 02/25/2020.   PAST MEDICAL HISTORY :   has a past medical history of Depression, Eating disorder, and Migraines.  has a past surgical history that includes Cholecystectomy (2009); Laparoscopic gastric banding (2009); Tonsillectomy (2006); Upper gi endoscopy (2014); Laparoscopic gastric sleeve resection (01/2013); Cesarean section (N/A, 10/07/2016); and Cesarean section (N/A, 02/24/2020). Prior to Admission medications   Medication Sig Start Date End Date Taking? Authorizing Provider  aspirin 81 MG EC tablet Take by mouth. 09/25/19 09/24/20 Yes [provider]  cyclobenzaprine (FLEXERIL) 5 MG tablet Take 5 mg by mouth at bedtime.   Yes [provider]  escitalopram (LEXAPRO) 10 MG tablet Take 10 mg by mouth daily.   Yes [provider]  fluconazole (DIFLUCAN) 150 MG tablet Take 150 mg by mouth daily.   Yes [provider]  Prenatal Vit-Fe Fumarate-FA (PRENATAL MULTIVITAMIN) TABS tablet Take 1 tablet by mouth daily at 12 noon.   Yes [provider]  cefdinir (OMNICEF) 300 MG capsule Take 300 mg by mouth 2 (two) times daily. Patient not taking: Reported on 02/24/2020    [provider]   Allergies  Allergen Reactions  . Ibuprofen Swelling    Swelling of face and hands  . Derma Guard [Protective Adhesive Powder]     Dermabond - urticaria    FAMILY HISTORY:  family history includes Cancer in her maternal grandfather, maternal grandmother, paternal grandfather, and paternal grandmother; Heart disease in her paternal grandfather. SOCIAL HISTORY:  reports that she has never smoked. She has never  used smokeless tobacco. She reports  that she does not drink alcohol and does not use drugs.   COVID-19 DISASTER DECLARATION:  FULL CONTACT PHYSICAL EXAMINATION WAS NOT POSSIBLE DUE TO TREATMENT OF COVID-19 AND  CONSERVATION OF PERSONAL PROTECTIVE EQUIPMENT, LIMITED EXAM FINDINGS INCLUDE-  Patient assessed or the symptoms described in the history of present illness.  In the context of the Global COVID-19 pandemic, which necessitated consideration that the patient might be at risk for infection with the SARS-CoV-2 virus that causes COVID-19, Institutional protocols and algorithms that pertain to the evaluation of patients at risk for COVID-19 are in a state of rapid change based on information released by regulatory bodies including the CDC and federal and state organizations. These policies and algorithms were followed during the patient's care while in hospital.  REVIEW OF SYSTEMS:  Positives in BOLD Constitutional: Negative for fever, chills, weight loss, malaise/fatigue and diaphoresis.  HENT: Negative for hearing loss, ear pain, nosebleeds, congestion, sore throat, neck pain, tinnitus and ear discharge.   Eyes: Negative for blurred vision, double vision, photophobia, pain, discharge and redness.  Respiratory: Negative for +cough, hemoptysis, sputum production, +shortness of breath, wheezing and stridor.   Cardiovascular: Negative for chest pain, palpitations, orthopnea, claudication, leg swelling and PND.  Gastrointestinal: Negative for heartburn, nausea, vomiting, abdominal pain, diarrhea, constipation, blood in stool and melena.  Genitourinary: Negative for dysuria, urgency, frequency, hematuria and flank pain.  Musculoskeletal: Negative for myalgias, back pain, joint pain and falls.  Skin: Negative for itching and rash.  Neurological: Negative for dizziness, tingling, tremors, sensory change, speech change, focal weakness, seizures, loss of consciousness, weakness and headaches.  Endo/Heme/Allergies: Negative for  environmental allergies and polydipsia. Does not bruise/bleed easily.  SUBJECTIVE:  Pt reports Dyspnea on exertion and dry nonproductive cough Denies chest pain, abdominal pain, N/V/D, fever, chills On 5L [place]  VITAL SIGNS: Temp:  [97.5 F (36.4 C)-99.1 F (37.3 C)] 97.5 F (36.4 C) (08/04 1528) Pulse Rate:  [57-104] 57 (08/04 1528) Resp:  [16-26] 18 (08/04 1528) BP: (97-130)/(65-94) 130/75 (08/04 1528) SpO2:  [83 %-97 %] 89 % (08/04 1835)  PHYSICAL EXAMINATION: General:  Acutely ill appearing female, sitting in bed, on 5 L nasal cannula, no acute distress Neuro: Awake, alert and oriented x4, follows commands, no focal deficits, speech clear HEENT: Atraumatic, normocephalic, neck supple, no JVD Cardiovascular: Regular rate and rhythm Lungs: Unable to auscultate due to CAPR, even, nonlabored, normal effort Abdomen: C-section surgical scar and dressing intact Musculoskeletal: No deformities, normal bulk and tone, no edema Skin: Warm and dry.  No obvious rashes, lesions, ulcerations  Recent Labs  Lab 02/24/20 1049 02/25/20 0727  NA 136 138  K 3.6 4.6  CL 107 111  CO2 18* 19*  BUN 6 7  CREATININE 0.89 0.77  GLUCOSE 98 114*   Recent Labs  Lab 02/24/20 1049 02/25/20 0727  HGB 11.1* 9.4*  HCT 34.2* 28.5*  WBC 9.7 12.5*  PLT 165 161   DG Chest 2 View  Result Date: 02/24/2020 CLINICAL DATA:  Tachycardia. Additional history provided: Patient [redacted] weeks pregnant, reportedly diagnosed with COVID last week, difficulty breathing. EXAM: CHEST - 2 VIEW COMPARISON:  Prior chest radiographs 07/25/2012 FINDINGS: Heart size within normal limits. There are fairly extensive multifocal airspace opacities bilaterally with a mid to lower lung predominance. No evidence of pleural effusion or pneumothorax. Incidentally noted azygos fissure. No acute bony abnormality identified. IMPRESSION: Fairly extensive bilateral multifocal airspace opacities with a mid to lower lung predominance. These findings  likely reflect multifocal pneumonia given the provided history of COVID positivity. Electronically Signed   By: Jackey Loge DO   On: 02/24/2020 10:38   US Venous Img Lower Bilateral (DVT)  Result Date: 02/24/2020 CLINICAL DATA:  Tachycardia, shortness of breath. EXAM: Bilateral LOWER EXTREMITY VENOUS DOPPLER ULTRASOUND TECHNIQUE: Gray-scale sonography with compression, as well as color and duplex ultrasound, were performed to evaluate the deep venous system(s) from the level of the common femoral vein through the popliteal and proximal calf veins. COMPARISON:  None. FINDINGS: VENOUS Normal compressibility of the common femoral, superficial femoral, and popliteal veins, as well as the visualized calf veins. Visualized portions of profunda femoral vein and great saphenous vein unremarkable. No filling defects to suggest DVT on grayscale or color Doppler imaging. Doppler waveforms show normal direction of venous flow, normal respiratory plasticity and response to augmentation. Limited views of the contralateral common femoral vein are unremarkable. OTHER None. Limitations: none IMPRESSION: No evidence of deep venous thrombosis seen in either lower extremity. Electronically Signed   By: Lupita Raider M.D.   On: 02/24/2020 11:54    ASSESSMENT / PLAN:  Acute Hypoxic Respiratory Failure secondary to COVID-19 Pneumonia -Supplemental O2 as needed to maintain O2 sats > 88% -Follow intermittent CXR & ABG as needed -Continue Remdesivir (Day 2/5) -Continue Steroids -Vitamin C & Zinc -Prn Bronchodilators & Mucinex -Trend inflammatory markers -Received 40 mg IV Lasix x1 dose 02/25/20 -Will check Procalcitonin to assess for superimposed bacterial pneumonia (if elevated will need CAP coverage) -Discussed with Dr. Belia Heman, will obtain CTA Chest to r/o PE (discussed with pharmacy regarding contrast and safety for lactating mother and baby, per pharmacy it is safe to administer contrast)>> CTA negative for PE -Maintain  Euvovolemia to net negative fluid status -Pulmonary Toilet -Recommend self proning for 16+ hours day if okay with OB/GYN.             BEST PRACTICES DISPOSITION: Labor & Delivery GOALS OF CARE: Full Code VTE PROPHYLAXIS: SQ Lovenox CONSULTS: OB/GYN (primary service), Hospitalist UPDATES: Updated pt and significant other at bedside 02/25/20   Harlon Ditty, AGACNP-BC  Pulmonary & Critical Care Medicine Pager: [phone]  02/25/2020, 10:16 PM   PCCM ATTENDING ATTESTATION:  I have evaluated patient with the APP, I personally  reviewed database in its entirety and discussed care plan in detail. In addition, this patient was discussed on multidisciplinary rounds.   I agree with assessment and plan.   COVID pneumonia CT CHEST NEG FOR PE  COVID-19 infection, ARDS and pneumonia/pneumonitis Continue IV steroids  IV remdisivir Aggressive pulm toilet recommended OOB to chair as tolerated  Pulmonary hygiene proning as tolerated due to severe hypoxia   Maintain airborne and contact precautions  As needed bronchodilators (MDI) Vitamin C and zinc Antitussives Oxygen as needed   Will sign off at this time. Please call back with any questions    Lucie Leather, M.D.  [facility]  Medical Director [facility] Medical Director [facility] Cardio-Pulmonary Department

## 2020-02-25 NOTE — Progress Notes (Signed)
Post Op Day 1  Subjective: Doing well postoperatively. Pain managed with PO meds, tolerating regular diet. Indwelling urinary catheter in place draining adequate clear yellow urine. On 3L O2 by nasal cannula. Still with some cough.    Objective: BP 114/76 (BP Location: Left Arm)   Pulse 67   Temp 97.9 F (36.6 C) (Oral)   Resp 18   Ht 5\' 3"  (1.6 m)   Wt 117.9 kg   LMP 02/26/2019 (Exact Date)   SpO2 97%   Breastfeeding Unknown   BMI 46.06 kg/m    Physical Exam:  General: alert, cooperative and appears stated age Breasts: soft/nontender CV: RRR Pulm: nl effort, CTABL Abdomen: soft, non-tender, active bowel sounds Uterine Fundus: firm Incision: no significant drainage Lochia: appropriate DVT Evaluation: No evidence of DVT seen on physical exam. No cords or calf tenderness. No significant calf/ankle edema.  Recent Labs    02/24/20 1049 02/25/20 0727  HGB 11.1* 9.4*  HCT 34.2* 28.5*  WBC 9.7 12.5*  PLT 165 161    Assessment/Plan: 38 y.o. G2P1002 postop day # 1  -Continue routine postpartum care, remove urinary catheter get get OOB today. -Lactation consult PRN for breastfeeding  -Acute blood loss anemia - hemodynamically stable and asymptomatic; start PO ferrous sulfate BID with stool softeners  -COVID-19 with pneumonia: Currently on 3L O2 by nasal cannula. Hospitalist consulted yesterday, appreciate continued consult. Follow current orders per their consult.   Disposition: Continue inpatient postpartum care   LOS: 1 day   04/26/20, CNM 02/25/2020, 8:14 AM   ----- 04/26/2020 Certified Nurse Midwife [facility]

## 2020-02-26 ENCOUNTER — Encounter: Payer: Self-pay | Admitting: Obstetrics and Gynecology

## 2020-02-26 DIAGNOSIS — U071 COVID-19: Secondary | ICD-10-CM | POA: Diagnosis not present

## 2020-02-26 DIAGNOSIS — O9081 Anemia of the puerperium: Secondary | ICD-10-CM | POA: Diagnosis not present

## 2020-02-26 DIAGNOSIS — O9853 Other viral diseases complicating the puerperium: Secondary | ICD-10-CM | POA: Diagnosis not present

## 2020-02-26 DIAGNOSIS — G8918 Other acute postprocedural pain: Secondary | ICD-10-CM | POA: Diagnosis not present

## 2020-02-26 DIAGNOSIS — Z3A20 20 weeks gestation of pregnancy: Secondary | ICD-10-CM | POA: Diagnosis not present

## 2020-02-26 DIAGNOSIS — I517 Cardiomegaly: Secondary | ICD-10-CM | POA: Diagnosis not present

## 2020-02-26 DIAGNOSIS — R1084 Generalized abdominal pain: Secondary | ICD-10-CM | POA: Diagnosis not present

## 2020-02-26 DIAGNOSIS — J1282 Pneumonia due to coronavirus disease 2019: Secondary | ICD-10-CM | POA: Diagnosis not present

## 2020-02-26 DIAGNOSIS — J189 Pneumonia, unspecified organism: Secondary | ICD-10-CM | POA: Diagnosis not present

## 2020-02-26 DIAGNOSIS — O98512 Other viral diseases complicating pregnancy, second trimester: Secondary | ICD-10-CM | POA: Diagnosis not present

## 2020-02-26 DIAGNOSIS — Z3A38 38 weeks gestation of pregnancy: Secondary | ICD-10-CM | POA: Diagnosis not present

## 2020-02-26 DIAGNOSIS — O99519 Diseases of the respiratory system complicating pregnancy, unspecified trimester: Secondary | ICD-10-CM | POA: Diagnosis not present

## 2020-02-26 DIAGNOSIS — A09 Infectious gastroenteritis and colitis, unspecified: Secondary | ICD-10-CM | POA: Diagnosis not present

## 2020-02-26 DIAGNOSIS — F329 Major depressive disorder, single episode, unspecified: Secondary | ICD-10-CM | POA: Diagnosis not present

## 2020-02-26 LAB — COMPREHENSIVE METABOLIC PANEL
ALT: 25 U/L (ref 0–44)
AST: 44 U/L — ABNORMAL HIGH (ref 15–41)
Albumin: 2.3 g/dL — ABNORMAL LOW (ref 3.5–5.0)
Alkaline Phosphatase: 120 U/L (ref 38–126)
Anion gap: 11 (ref 5–15)
BUN: 15 mg/dL (ref 6–20)
CO2: 22 mmol/L (ref 22–32)
Calcium: 8.8 mg/dL — ABNORMAL LOW (ref 8.9–10.3)
Chloride: 105 mmol/L (ref 98–111)
Creatinine, Ser: 0.79 mg/dL (ref 0.44–1.00)
GFR calc Af Amer: >60 mL/min (ref >60)
GFR calc non Af Amer: >60 mL/min (ref >60)
Glucose, Bld: 98 mg/dL (ref 70–99)
Potassium: 4.7 mmol/L (ref 3.5–5.1)
Sodium: 138 mmol/L (ref 135–145)
Total Bilirubin: 0.4 mg/dL (ref 0.3–1.2)
Total Protein: 5.7 g/dL — ABNORMAL LOW (ref 6.5–8.1)

## 2020-02-26 LAB — CBC WITH DIFFERENTIAL/PLATELET
Abs Immature Granulocytes: 0.74 10*3/uL — ABNORMAL HIGH (ref 0.00–0.07)
Basophils Absolute: 0.1 10*3/uL (ref 0.0–0.1)
Basophils Relative: 0 %
Eosinophils Absolute: 0.1 10*3/uL (ref 0.0–0.5)
Eosinophils Relative: 0 %
HCT: 31.7 % — ABNORMAL LOW (ref 36.0–46.0)
Hemoglobin: 10.4 g/dL — ABNORMAL LOW (ref 12.0–15.0)
Immature Granulocytes: 3 %
Lymphocytes Relative: 10 %
Lymphs Abs: 2.3 10*3/uL (ref 0.7–4.0)
MCH: 25.2 pg — ABNORMAL LOW (ref 26.0–34.0)
MCHC: 32.8 g/dL (ref 30.0–36.0)
MCV: 76.9 fL — ABNORMAL LOW (ref 80.0–100.0)
Monocytes Absolute: 0.8 10*3/uL (ref 0.1–1.0)
Monocytes Relative: 4 %
Neutro Abs: 18.2 10*3/uL — ABNORMAL HIGH (ref 1.7–7.7)
Neutrophils Relative %: 83 %
Platelets: 267 10*3/uL (ref 150–400)
RBC: 4.12 MIL/uL (ref 3.87–5.11)
RDW: 16.3 % — ABNORMAL HIGH (ref 11.5–15.5)
WBC: 22.1 10*3/uL — ABNORMAL HIGH (ref 4.0–10.5)
nRBC: 0.5 % — ABNORMAL HIGH (ref 0.0–0.2)

## 2020-02-26 LAB — INFLUENZA PANEL BY PCR (TYPE A & B)
Influenza A By PCR: NEGATIVE
Influenza B By PCR: NEGATIVE

## 2020-02-26 LAB — SURGICAL PATHOLOGY

## 2020-02-26 LAB — C-REACTIVE PROTEIN: CRP: 8.1 mg/dL — ABNORMAL HIGH (ref <1.0)

## 2020-02-26 LAB — STREP PNEUMONIAE URINARY ANTIGEN: Strep Pneumo Urinary Antigen: NEGATIVE

## 2020-02-26 LAB — FERRITIN: Ferritin: 58 ng/mL (ref 11–307)

## 2020-02-26 LAB — PROCALCITONIN: Procalcitonin: 0.8 ng/mL

## 2020-02-26 LAB — FIBRIN DERIVATIVES D-DIMER (ARMC ONLY): Fibrin derivatives D-dimer (ARMC): 1905.06 ng/mL (FEU) — ABNORMAL HIGH (ref 0.00–499.00)

## 2020-02-26 MED ORDER — SODIUM CHLORIDE 0.9 % IV SOLN
2.0000 g | INTRAVENOUS | Status: DC
  Administered 2020-02-26 – 2020-02-28 (×3): 2 g via INTRAVENOUS
  Filled 2020-02-26: qty 20
  Filled 2020-02-26: qty 2
  Filled 2020-02-26 (×2): qty 20

## 2020-02-26 MED ORDER — FUROSEMIDE 10 MG/ML IJ SOLN
40.0000 mg | Freq: Once | INTRAMUSCULAR | Status: AC
  Administered 2020-02-26: 40 mg via INTRAVENOUS
  Filled 2020-02-26: qty 4

## 2020-02-26 MED ORDER — IOHEXOL 350 MG/ML SOLN
75.0000 mL | Freq: Once | INTRAVENOUS | Status: AC | PRN
  Administered 2020-02-26: 75 mL via INTRAVENOUS

## 2020-02-26 MED ORDER — SODIUM CHLORIDE 0.9 % IV SOLN
500.0000 mg | INTRAVENOUS | Status: DC
  Administered 2020-02-26 – 2020-02-28 (×3): 500 mg via INTRAVENOUS
  Filled 2020-02-26 (×4): qty 500

## 2020-02-26 NOTE — Progress Notes (Signed)
1        [place] [facility]   PATIENT NAME: Sue Hernandez    MR#:  [id number]  DATE OF BIRTH:  11/11/1980  SUBJECTIVE:  CHIEF COMPLAINT:   Chief Complaint  Patient presents with  . Shortness of Breath  . Cough  . Diarrhea  Slowly improving.  No new complaints. REVIEW OF SYSTEMS:  Review of Systems  Constitutional: Negative for diaphoresis, fever, malaise/fatigue and weight loss.  HENT: Negative for ear discharge, ear pain, hearing loss, nosebleeds, sore throat and tinnitus.   Eyes: Negative for blurred vision and pain.  Respiratory: Positive for cough and shortness of breath. Negative for hemoptysis and wheezing.   Cardiovascular: Negative for chest pain, palpitations, orthopnea and leg swelling.  Gastrointestinal: Negative for abdominal pain, blood in stool, constipation, diarrhea, heartburn, nausea and vomiting.  Genitourinary: Negative for dysuria, frequency and urgency.  Musculoskeletal: Negative for back pain and myalgias.  Skin: Negative for itching and rash.  Neurological: Negative for dizziness, tingling, tremors, focal weakness, seizures, weakness and headaches.  Psychiatric/Behavioral: Negative for depression. The patient is not nervous/anxious.    DRUG ALLERGIES:   Allergies  Allergen Reactions  . Ibuprofen Swelling    Swelling of face and hands  . Derma Guard [Protective Adhesive Powder]     Dermabond - urticaria   VITALS:  Blood pressure 125/76, pulse 75, temperature 97.9 F (36.6 C), temperature source Axillary, resp. rate 18, height 5\' 3"  (1.6 m), weight 117.9 kg, last menstrual period 02/26/2019, SpO2 90 %, unknown if currently breastfeeding. PHYSICAL EXAMINATION:  Physical Exam  General: In no acute distress Skin no rashes Neuro alert and oriented x3 Psych: Normal affect, no psychosis  Abdomen: C-section surgical scar and dressing present LABORATORY PANEL:  Female CBC Recent Labs  Lab 02/26/20 0755  WBC 22.1*  HGB 10.4*  HCT  31.7*  PLT 267   ------------------------------------------------------------------------------------------------------------------ Chemistries  Recent Labs  Lab 02/24/20 1049 02/25/20 0727 02/26/20 0755  NA 136   < > 138  K 3.6   < > 4.7  CL 107   < > 105  CO2 18*   < > 22  GLUCOSE 98   < > 98  BUN 6   < > 15  CREATININE 0.89   < > 0.79  CALCIUM 8.0*   < > 8.8*  MG 1.9  --   --   AST 45*   < > 44*  ALT 24   < > 25  ALKPHOS 164*   < > 120  BILITOT 0.6   < > 0.4   < > = values in this interval not displayed.   RADIOLOGY:  CT ANGIO CHEST PE W OR WO CONTRAST  Result Date: 02/26/2020 CLINICAL DATA:  COVID pneumonia pregnant patient EXAM: CT ANGIOGRAPHY CHEST WITH CONTRAST TECHNIQUE: Multidetector CT imaging of the chest was performed using the standard protocol during bolus administration of intravenous contrast. Multiplanar CT image reconstructions and MIPs were obtained to evaluate the vascular anatomy. CONTRAST:  26mL OMNIPAQUE IOHEXOL 350 MG/ML SOLN COMPARISON:  Chest x-ray 02/24/2020 FINDINGS: Cardiovascular: Satisfactory opacification of the pulmonary arteries to the segmental level. No evidence of pulmonary embolism. Nonaneurysmal aorta. Borderline cardiomegaly. No pericardial effusion. Mediastinum/Nodes: No enlarged mediastinal, hilar, or axillary lymph nodes. Thyroid gland, trachea, and esophagus demonstrate no significant findings. Lungs/Pleura: Extensive bilateral slightly nodular appearing consolidations and ground-glass densities throughout the lungs. No pleural effusion or pneumothorax. Upper Abdomen: Partially visualized postsurgical changes of the stomach. No acute abnormality Musculoskeletal:  No chest wall abnormality. No acute or significant osseous findings. Review of the MIP images confirms the above findings. IMPRESSION: 1. Negative for acute pulmonary embolus. 2. Extensive bilateral consolidations and ground-glass densities throughout the lungs consistent with bilateral  pneumonia and history of COVID positivity. Electronically Signed   By: Jasmine Pang M.D.   On: 02/26/2020 00:32   ASSESSMENT AND PLAN:  39 year old female with history of depression, migraine, eating disorder seen in consultation for pneumonia from Covid.  Acute hypoxic respiratory failure due to Pneumonia from COVID-19 Patient requiring 4 to 5 L oxygen to maintain saturations above 90% -Give another 40 mg of IV Lasix to help diurese in an effort to wean her oxygen.  If she qualifies for oxygen TOC team to arrange for home oxygen at discharge. Continue remdesivir and steroids day 3/5 Vitamin C and zinc  As needed Mucinex Bronchodilators Daily inflammatory markers, D-dimer 2084 Recommend proning for 16+ hours a day while awake if okay with OB/GYN Appreciate PCCM input.  Underwent CT chest last night which was negative for PE but showed extensive bilateral consolidation.  Community-acquired bacterial pneumonia Procalcitonin was more than 1->0.8 CT chest showed extensive bilateral consolidation Continue Rocephin and Zithromax  Depression Continue Lexapro  Body mass index is 46.06 kg/m.      Status is: Inpatient  Remains inpatient appropriate because:Inpatient level of care appropriate due to severity of illness   Dispo: The patient is from: Home              Anticipated d/c is to: Home              Anticipated d/c date is: 2 days              Patient currently is not medically stable to d/c.   DVT prophylaxis:            enoxaparin (LOVENOX) injection 40 mg Start: 02/25/20 2200 SCD's Start: 02/24/20 2214 SCDs Start: 02/24/20 1652     Family Communication: Updated mother at bedside   All the records are reviewed and case discussed with Care Management/Social Worker. Management plans discussed with the patient, family (mother at bedside) and they are in agreement.  CODE STATUS: Full Code  TOTAL TIME TAKING CARE OF THIS PATIENT: 25 minutes.   More than 50% of the  time was spent in counseling/coordination of care: YES  POSSIBLE D/C IN 2 DAYS, DEPENDING ON CLINICAL CONDITION.   Delfino Lovett M.D on 02/26/2020 at 3:32 PM  [facility]   CC: Primary care physician; Allegra Grana, FNP  Note: This dictation was prepared with Dragon dictation along with smaller phrase technology. Any transcriptional errors that result from this process are unintentional.

## 2020-02-26 NOTE — Progress Notes (Signed)
Post OP Day 2  Subjective: Doing well postoperatively.  Pain managed with PO meds, tolerating regular diet.  Able to ambulate to bathroom to void.  On 3L O2 via [place].  Still some SOB and cough.   Objective: BP 125/76 (BP Location: Left Arm)   Pulse 75   Temp 97.9 F (36.6 C) (Axillary)   Resp 18   Ht 5\' 3"  (1.6 m)   Wt 117.9 kg   LMP 02/26/2019 (Exact Date)   SpO2 (!) 85%   Breastfeeding Unknown   BMI 46.06 kg/m    Physical Exam:  General: fatigued Breasts: soft/nontender CV: RRR Pulm: dyspnea with exertion, normal effort with resting  Abdomen: soft, non-tender, active bowel sounds Uterine Fundus: firm Incision: healing well Perineum: minimal edema, intact Lochia: appropriate DVT Evaluation: No evidence of DVT seen on physical exam.  Recent Labs    02/25/20 0727 02/26/20 0755  HGB 9.4* 10.4*  HCT 28.5* 31.7*  WBC 12.5* 22.1*  PLT 161 267    Assessment/Plan: 39 y.o. G2P1002 postpartum day # 2  -Continue routine postpartum care -Lactation consult PRN for breastfeeding   -Acute blood loss anemia - hemodynamically stable and asymptomatic; continue PO ferrous sulfate BID with stool softeners  -Consult with hospitalist.  Will attempt to wean O2 to 2 liters, maintain sats 88-90%.   -Likely discharge on Saturday with potential for home O2 -Plan to transfer to M/B when able to   Disposition: Continue inpatient postpartum care    LOS: 2 days   Wednesday, CNM 02/26/2020, 12:54 PM   ----- 04/27/2020  Certified Nurse Midwife [facility]

## 2020-02-26 NOTE — Lactation Note (Signed)
This note was copied from a baby's chart. Lactation Consultation Note  Patient Name: Sue Hernandez [id number] Date: 02/26/2020   Mom was Covid positive with pneumonia.  Mom continues to breast feed, but is now supplementing with [employer] Formula.  Can hand express small amounts of colostrum.  Observed Sue Hernandez at the breast.  He has a strong, rhythmic suck with occasional swallows.  Mom reports breast feeding her first for 6 months, but struggled the entire time with having enough milk.  The first had a tongue tie that she later had repaired. Explained supply and demand stressing importance of frequent breast feeding and draining of the breast to bring in mature milk, to ensure a plentiful milk supply and to prevent engorgement.  Handout given on what to expect the first 4 days of Sue Hernandez's life with feeding reviewing normal newborn stomach size, normal course of lactation and routine newborn feeding patterns.  Lactation [employer] given with contact numbers and virtual support group information and reviewed.  Lactation's name and number given and encouraged to call with any questions, concerns or when needed assistance.   Maternal Data    Feeding    LATCH Score                   Interventions    Lactation Tools Discussed/Used     Consult Status      Louis Meckel 02/26/2020, 8:25 PM

## 2020-02-26 NOTE — Anesthesia Postprocedure Evaluation (Signed)
Anesthesia Post Note  Patient: Sue Hernandez  Procedure(s) Performed: CESAREAN SECTION (N/A )  Patient location during evaluation: Mother Baby Anesthesia Type: Spinal Level of consciousness: awake Pain management: pain level controlled Vital Signs Assessment: post-procedure vital signs reviewed and stable Respiratory status: spontaneous breathing Cardiovascular status: blood pressure returned to baseline Anesthetic complications: no   No complications documented.   Last Vitals:  Vitals:   02/26/20 0040 02/26/20 0045  BP:    Pulse:    Resp:    Temp:    SpO2: 92% 95%    Last Pain:  Vitals:   02/26/20 0038  TempSrc:   PainSc: 3                  Jaye Beagle

## 2020-02-27 DIAGNOSIS — O9853 Other viral diseases complicating the puerperium: Secondary | ICD-10-CM | POA: Diagnosis not present

## 2020-02-27 DIAGNOSIS — F329 Major depressive disorder, single episode, unspecified: Secondary | ICD-10-CM | POA: Diagnosis not present

## 2020-02-27 DIAGNOSIS — J189 Pneumonia, unspecified organism: Secondary | ICD-10-CM | POA: Diagnosis not present

## 2020-02-27 DIAGNOSIS — J1282 Pneumonia due to coronavirus disease 2019: Secondary | ICD-10-CM | POA: Diagnosis not present

## 2020-02-27 DIAGNOSIS — U071 COVID-19: Secondary | ICD-10-CM | POA: Diagnosis not present

## 2020-02-27 DIAGNOSIS — O9081 Anemia of the puerperium: Secondary | ICD-10-CM | POA: Diagnosis not present

## 2020-02-27 DIAGNOSIS — O99519 Diseases of the respiratory system complicating pregnancy, unspecified trimester: Secondary | ICD-10-CM | POA: Diagnosis not present

## 2020-02-27 DIAGNOSIS — Z3A38 38 weeks gestation of pregnancy: Secondary | ICD-10-CM | POA: Diagnosis not present

## 2020-02-27 DIAGNOSIS — A09 Infectious gastroenteritis and colitis, unspecified: Secondary | ICD-10-CM | POA: Diagnosis not present

## 2020-02-27 LAB — FERRITIN: Ferritin: 50 ng/mL (ref 11–307)

## 2020-02-27 LAB — CBC WITH DIFFERENTIAL/PLATELET
Abs Immature Granulocytes: 0.88 10*3/uL — ABNORMAL HIGH (ref 0.00–0.07)
Basophils Absolute: 0.1 10*3/uL (ref 0.0–0.1)
Basophils Relative: 0 %
Eosinophils Absolute: 0.1 10*3/uL (ref 0.0–0.5)
Eosinophils Relative: 0 %
HCT: 29.9 % — ABNORMAL LOW (ref 36.0–46.0)
Hemoglobin: 9.9 g/dL — ABNORMAL LOW (ref 12.0–15.0)
Immature Granulocytes: 4 %
Lymphocytes Relative: 11 %
Lymphs Abs: 2.4 10*3/uL (ref 0.7–4.0)
MCH: 25.4 pg — ABNORMAL LOW (ref 26.0–34.0)
MCHC: 33.1 g/dL (ref 30.0–36.0)
MCV: 76.9 fL — ABNORMAL LOW (ref 80.0–100.0)
Monocytes Absolute: 1.2 10*3/uL — ABNORMAL HIGH (ref 0.1–1.0)
Monocytes Relative: 6 %
Neutro Abs: 16.5 10*3/uL — ABNORMAL HIGH (ref 1.7–7.7)
Neutrophils Relative %: 79 %
Platelets: 323 10*3/uL (ref 150–400)
RBC: 3.89 MIL/uL (ref 3.87–5.11)
RDW: 16.5 % — ABNORMAL HIGH (ref 11.5–15.5)
WBC: 21.1 10*3/uL — ABNORMAL HIGH (ref 4.0–10.5)
nRBC: 0.8 % — ABNORMAL HIGH (ref 0.0–0.2)

## 2020-02-27 LAB — COMPREHENSIVE METABOLIC PANEL
ALT: 25 U/L (ref 0–44)
AST: 50 U/L — ABNORMAL HIGH (ref 15–41)
Albumin: 2.3 g/dL — ABNORMAL LOW (ref 3.5–5.0)
Alkaline Phosphatase: 108 U/L (ref 38–126)
Anion gap: 12 (ref 5–15)
BUN: 17 mg/dL (ref 6–20)
CO2: 23 mmol/L (ref 22–32)
Calcium: 8.2 mg/dL — ABNORMAL LOW (ref 8.9–10.3)
Chloride: 104 mmol/L (ref 98–111)
Creatinine, Ser: 0.88 mg/dL (ref 0.44–1.00)
GFR calc Af Amer: >60 mL/min (ref >60)
GFR calc non Af Amer: >60 mL/min (ref >60)
Glucose, Bld: 83 mg/dL (ref 70–99)
Potassium: 4.1 mmol/L (ref 3.5–5.1)
Sodium: 139 mmol/L (ref 135–145)
Total Bilirubin: 0.5 mg/dL (ref 0.3–1.2)
Total Protein: 5.6 g/dL — ABNORMAL LOW (ref 6.5–8.1)

## 2020-02-27 LAB — FIBRIN DERIVATIVES D-DIMER (ARMC ONLY): Fibrin derivatives D-dimer (ARMC): 847.06 ng/mL (FEU) — ABNORMAL HIGH (ref 0.00–499.00)

## 2020-02-27 LAB — LEGIONELLA PNEUMOPHILA SEROGP 1 UR AG: L. pneumophila Serogp 1 Ur Ag: NEGATIVE

## 2020-02-27 LAB — PROCALCITONIN: Procalcitonin: 0.42 ng/mL

## 2020-02-27 LAB — C-REACTIVE PROTEIN: CRP: 3 mg/dL — ABNORMAL HIGH (ref <1.0)

## 2020-02-27 MED ORDER — FUROSEMIDE 10 MG/ML IJ SOLN
40.0000 mg | Freq: Once | INTRAMUSCULAR | Status: AC
  Administered 2020-02-27: 40 mg via INTRAVENOUS
  Filled 2020-02-27: qty 4

## 2020-02-27 NOTE — Progress Notes (Signed)
1        [place] at [facility]   PATIENT NAME: Sue Hernandez    MR#:  [id number]  DATE OF BIRTH:  23-Jun-1981  SUBJECTIVE:  CHIEF COMPLAINT:   Chief Complaint  Patient presents with  . Shortness of Breath  . Cough  . Diarrhea  Improving.  No new complaints.  Not requiring any oxygen at this point REVIEW OF SYSTEMS:  Review of Systems  Constitutional: Negative for diaphoresis, fever, malaise/fatigue and weight loss.  HENT: Negative for ear discharge, ear pain, hearing loss, nosebleeds, sore throat and tinnitus.   Eyes: Negative for blurred vision and pain.  Respiratory: Positive for cough. Negative for hemoptysis and wheezing.   Cardiovascular: Negative for chest pain, palpitations, orthopnea and leg swelling.  Gastrointestinal: Negative for abdominal pain, blood in stool, constipation, diarrhea, heartburn, nausea and vomiting.  Genitourinary: Negative for dysuria, frequency and urgency.  Musculoskeletal: Negative for back pain and myalgias.  Skin: Negative for itching and rash.  Neurological: Negative for dizziness, tingling, tremors, focal weakness, seizures, weakness and headaches.  Psychiatric/Behavioral: Negative for depression. The patient is not nervous/anxious.    DRUG ALLERGIES:   Allergies  Allergen Reactions  . Ibuprofen Swelling    Swelling of face and hands  . Derma Guard [Protective Adhesive Powder]     Dermabond - urticaria   VITALS:  Blood pressure 118/84, pulse 100, temperature 97.6 F (36.4 C), temperature source Oral, resp. rate 16, height 5\' 3"  (1.6 m), weight 117.9 kg, last menstrual period 02/26/2019, SpO2 94 %, unknown if currently breastfeeding. PHYSICAL EXAMINATION:  Physical Exam  General: In no acute distress Skin no rashes Neuro alert and oriented x3 Psych: Normal affect, no psychosis  Abdomen: C-section surgical scar and dressing present LABORATORY PANEL:  Female CBC Recent Labs  Lab 02/27/20 0643  WBC 21.1*  HGB 9.9*    HCT 29.9*  PLT 323   ------------------------------------------------------------------------------------------------------------------ Chemistries  Recent Labs  Lab 02/24/20 1049 02/25/20 0727 02/27/20 0643  NA 136   < > 139  K 3.6   < > 4.1  CL 107   < > 104  CO2 18*   < > 23  GLUCOSE 98   < > 83  BUN 6   < > 17  CREATININE 0.89   < > 0.88  CALCIUM 8.0*   < > 8.2*  MG 1.9  --   --   AST 45*   < > 50*  ALT 24   < > 25  ALKPHOS 164*   < > 108  BILITOT 0.6   < > 0.5   < > = values in this interval not displayed.   RADIOLOGY:  No results found. ASSESSMENT AND PLAN:  39 year old female with history of depression, migraine, eating disorder seen in consultation for pneumonia from Covid.  Acute hypoxic respiratory failure due to Pneumonia from COVID-19 Patient weaned off to room air now -Given 40 mg of IV Lasix this morning Continue remdesivir and steroids day 4/5 Vitamin C and zinc  As needed Mucinex Bronchodilators Daily inflammatory markers, D-dimer 2084 Recommend proning for 16+ hours a day while awake if okay with OB/GYN  CT chest last night negative for PE but showed extensive bilateral consolidation.  Community-acquired bacterial pneumonia Procalcitonin was more than 1->0.8 CT chest showed extensive bilateral consolidation Continue Rocephin and Zithromax -switch to oral Augmentin to finish total 5 days course at discharge  Depression Continue Lexapro  Body mass index is 46.06 kg/m.  Status is: Inpatient  Remains inpatient appropriate because:Inpatient level of care appropriate due to severity of illness   Dispo: The patient is from: Home              Anticipated d/c is to: Home              Anticipated d/c date is: 1 day              Patient currently is not medically stable to d/c.  Need to finish her last dose of IV remdesivir tomorrow   DVT prophylaxis:            enoxaparin (LOVENOX) injection 40 mg Start: 02/25/20 2200 SCD's Start:  02/24/20 2214 SCDs Start: 02/24/20 1652     Family Communication: Updated mother at bedside   All the records are reviewed and case discussed with Care Management/Social Worker. Management plans discussed with the patient, family (mother at bedside) and they are in agreement.  CODE STATUS: Full Code  TOTAL TIME TAKING CARE OF THIS PATIENT: 25 minutes.   More than 50% of the time was spent in counseling/coordination of care: YES  POSSIBLE D/C IN 1 DAYS, DEPENDING ON CLINICAL CONDITION.   Delfino Lovett M.D on 02/27/2020 at 3:24 PM  [facility]   CC: Primary care physician; Allegra Grana, FNP  Note: This dictation was prepared with Dragon dictation along with smaller phrase technology. Any transcriptional errors that result from this process are unintentional.

## 2020-02-27 NOTE — TOC Progression Note (Addendum)
Transition of Care [facility] - Progression Note    Patient Details  Name: Sue Hernandez MRN: [id number] Date of Birth: 1981-06-09  Transition of Care [facility]) CM/SW Contact   Cellar, RN Phone Number: 02/27/2020, 3:08 PM  Clinical Narrative:    RN CM spoke with Dr. Sherryll Burger and nurse to confirm patient would not be discharging with O2 for home use. RN CM spoke with Zack @ DME who is aware of potential consult and able to assist if needed over the weekend. Spoke to patients mother, Clydie Braun and updated no need for O2 and confirmed patient and family did not feel as though they had a need for home health. Grandmother will be assisting after discharge while patient recovers. No other needs or concerns at this time. Patient is breastfeeding and supplementing with formula as needed.        Expected Discharge Plan and Services                                                 Social Determinants of Health (SDOH) Interventions    Readmission Risk Interventions No flowsheet data found.

## 2020-02-27 NOTE — Progress Notes (Signed)
Post OP Day 3  Subjective: Doing well postoperatively.  Pain managed with PO meds, tolerating regular diet.  Able to ambulate to bathroom to void.  On 2.5L O2 via [place].  SOB and cough continues.   Objective: BP (!) 115/59 (BP Location: Left Arm)   Pulse 87   Temp 98.1 F (36.7 C) (Oral)   Resp 16   Ht 5\' 3"  (1.6 m)   Wt 117.9 kg   LMP 02/26/2019 (Exact Date)   SpO2 94%   Breastfeeding Unknown   BMI 46.06 kg/m    Physical Exam:  General: fatigued Breasts: soft/nontender CV: RRR Pulm: dyspnea with exertion, some effort with resting  Abdomen: soft, non-tender Uterine Fundus: firm Incision: healing well Perineum: minimal edema, intact Lochia: appropriate DVT Evaluation: No evidence of DVT seen on physical exam.  Recent Labs    02/26/20 0755 02/27/20 0643  HGB 10.4* 9.9*  HCT 31.7* 29.9*  WBC 22.1* 21.1*  PLT 267 323    Assessment/Plan: 39 y.o. G2P1002 postpartum day # 2  -Continue routine postpartum care -Lactation consult PRN for breastfeeding, pt reports very low to no milk supply, pt is bottle feeding at this time -Acute blood loss anemia - hemodynamically stable and asymptomatic; continue PO ferrous sulfate BID with stool softeners  -Consult with hospitalist.    Disposition: Continue inpatient postpartum care    LOS: 3 days   30, CNM 02/27/2020, 8:22 AM

## 2020-02-27 NOTE — Progress Notes (Signed)
Back to bed after shower. Sats 88-91 on room air. Sue Hernandez

## 2020-02-28 DIAGNOSIS — O99519 Diseases of the respiratory system complicating pregnancy, unspecified trimester: Secondary | ICD-10-CM | POA: Diagnosis not present

## 2020-02-28 DIAGNOSIS — Z3A38 38 weeks gestation of pregnancy: Secondary | ICD-10-CM | POA: Diagnosis not present

## 2020-02-28 DIAGNOSIS — U071 COVID-19: Secondary | ICD-10-CM | POA: Diagnosis not present

## 2020-02-28 DIAGNOSIS — A09 Infectious gastroenteritis and colitis, unspecified: Secondary | ICD-10-CM | POA: Diagnosis not present

## 2020-02-28 DIAGNOSIS — J189 Pneumonia, unspecified organism: Secondary | ICD-10-CM | POA: Diagnosis not present

## 2020-02-28 DIAGNOSIS — J1282 Pneumonia due to coronavirus disease 2019: Secondary | ICD-10-CM | POA: Diagnosis not present

## 2020-02-28 DIAGNOSIS — F329 Major depressive disorder, single episode, unspecified: Secondary | ICD-10-CM | POA: Diagnosis not present

## 2020-02-28 LAB — COMPREHENSIVE METABOLIC PANEL
ALT: 29 U/L (ref 0–44)
AST: 49 U/L — ABNORMAL HIGH (ref 15–41)
Albumin: 2.4 g/dL — ABNORMAL LOW (ref 3.5–5.0)
Alkaline Phosphatase: 104 U/L (ref 38–126)
Anion gap: 14 (ref 5–15)
BUN: 18 mg/dL (ref 6–20)
CO2: 23 mmol/L (ref 22–32)
Calcium: 8.2 mg/dL — ABNORMAL LOW (ref 8.9–10.3)
Chloride: 103 mmol/L (ref 98–111)
Creatinine, Ser: 0.74 mg/dL (ref 0.44–1.00)
GFR calc Af Amer: >60 mL/min (ref >60)
GFR calc non Af Amer: >60 mL/min (ref >60)
Glucose, Bld: 100 mg/dL — ABNORMAL HIGH (ref 70–99)
Potassium: 3.8 mmol/L (ref 3.5–5.1)
Sodium: 140 mmol/L (ref 135–145)
Total Bilirubin: 0.6 mg/dL (ref 0.3–1.2)
Total Protein: 5.9 g/dL — ABNORMAL LOW (ref 6.5–8.1)

## 2020-02-28 LAB — CBC WITH DIFFERENTIAL/PLATELET
Abs Immature Granulocytes: 0.9 10*3/uL — ABNORMAL HIGH (ref 0.00–0.07)
Basophils Absolute: 0 10*3/uL (ref 0.0–0.1)
Basophils Relative: 0 %
Eosinophils Absolute: 0 10*3/uL (ref 0.0–0.5)
Eosinophils Relative: 0 %
HCT: 29.3 % — ABNORMAL LOW (ref 36.0–46.0)
Hemoglobin: 9.1 g/dL — ABNORMAL LOW (ref 12.0–15.0)
Immature Granulocytes: 7 %
Lymphocytes Relative: 10 %
Lymphs Abs: 1.3 10*3/uL (ref 0.7–4.0)
MCH: 24.9 pg — ABNORMAL LOW (ref 26.0–34.0)
MCHC: 31.1 g/dL (ref 30.0–36.0)
MCV: 80.1 fL (ref 80.0–100.0)
Monocytes Absolute: 0.8 10*3/uL (ref 0.1–1.0)
Monocytes Relative: 6 %
Neutro Abs: 10.5 10*3/uL — ABNORMAL HIGH (ref 1.7–7.7)
Neutrophils Relative %: 77 %
Platelets: 361 10*3/uL (ref 150–400)
RBC: 3.66 MIL/uL — ABNORMAL LOW (ref 3.87–5.11)
RDW: 16.4 % — ABNORMAL HIGH (ref 11.5–15.5)
Smear Review: NORMAL
WBC: 13.5 10*3/uL — ABNORMAL HIGH (ref 4.0–10.5)
nRBC: 1.1 % — ABNORMAL HIGH (ref 0.0–0.2)

## 2020-02-28 LAB — FIBRIN DERIVATIVES D-DIMER (ARMC ONLY): Fibrin derivatives D-dimer (ARMC): 647.62 ng/mL (FEU) — ABNORMAL HIGH (ref 0.00–499.00)

## 2020-02-28 LAB — FERRITIN: Ferritin: 33 ng/mL (ref 11–307)

## 2020-02-28 LAB — C-REACTIVE PROTEIN: CRP: 1.5 mg/dL — ABNORMAL HIGH (ref <1.0)

## 2020-02-28 MED ORDER — ASCORBIC ACID 500 MG PO TABS: 500.0000 mg | ORAL_TABLET | Freq: Every day | ORAL | 0 refills | Status: AC

## 2020-02-28 MED ORDER — PREDNISONE 10 MG (21) PO TBPK
ORAL_TABLET | ORAL | 0 refills | Status: DC
Start: 2020-02-28 — End: 2021-04-04

## 2020-02-28 MED ORDER — ZINC SULFATE 220 (50 ZN) MG PO CAPS: 220.0000 mg | ORAL_CAPSULE | Freq: Every day | ORAL | 0 refills | Status: AC

## 2020-02-28 MED ORDER — HYDROCOD POLST-CPM POLST ER 10-8 MG/5ML PO SUER
5.0000 mL | Freq: Once | ORAL | Status: AC
  Administered 2020-02-28: 5 mL via ORAL

## 2020-02-28 MED ORDER — AMOXICILLIN-POT CLAVULANATE 875-125 MG PO TABS
1.0000 | ORAL_TABLET | Freq: Two times a day (BID) | ORAL | 0 refills | Status: AC
Start: 2020-02-28 — End: 2020-03-06

## 2020-02-28 NOTE — Progress Notes (Signed)
1        [place] at [facility]   PATIENT NAME: Sue Hernandez    MR#:  [id number]  DATE OF BIRTH:  Jan 03, 1981  SUBJECTIVE:  CHIEF COMPLAINT:   Chief Complaint  Patient presents with  . Shortness of Breath  . Cough  . Diarrhea  patient had episode of tachycardia with heart rate up to 135 and hypoxia with oxygen saturation dropping in the 80s around 2:20 in the morning when she got up to go to the bathroom when she felt short of breath.  She was placed on 2 L oxygen and has been doing well since then.  Now she is feeling back to her baseline.  Her mother is at bedside. REVIEW OF SYSTEMS:  Review of Systems  Constitutional: Negative for diaphoresis, fever, malaise/fatigue and weight loss.  HENT: Negative for ear discharge, ear pain, hearing loss, nosebleeds, sore throat and tinnitus.   Eyes: Negative for blurred vision and pain.  Respiratory: Positive for cough. Negative for hemoptysis and wheezing.   Cardiovascular: Negative for chest pain, palpitations, orthopnea and leg swelling.  Gastrointestinal: Negative for abdominal pain, blood in stool, constipation, diarrhea, heartburn, nausea and vomiting.  Genitourinary: Negative for dysuria, frequency and urgency.  Musculoskeletal: Negative for back pain and myalgias.  Skin: Negative for itching and rash.  Neurological: Negative for dizziness, tingling, tremors, focal weakness, seizures, weakness and headaches.  Psychiatric/Behavioral: Negative for depression. The patient is not nervous/anxious.    DRUG ALLERGIES:   Allergies  Allergen Reactions  . Ibuprofen Swelling    Swelling of face and hands  . Derma Guard [Protective Adhesive Powder]     Dermabond - urticaria   VITALS:  Blood pressure 125/63, pulse 71, temperature 98.4 F (36.9 C), temperature source Oral, resp. rate 18, height 5\' 3"  (1.6 m), weight 117.9 kg, last menstrual period 02/26/2019, SpO2 (!) 89 %, unknown if currently breastfeeding. PHYSICAL  EXAMINATION:  Physical Exam  General: In no acute distress Skin no rashes Neuro alert and oriented x3 Psych: Normal affect, no psychosis  Abdomen: C-section surgical scar and dressing present LABORATORY PANEL:  Female CBC Recent Labs  Lab 02/28/20 0947  WBC 13.5*  HGB 9.1*  HCT 29.3*  PLT 361   ------------------------------------------------------------------------------------------------------------------ Chemistries  Recent Labs  Lab 02/24/20 1049 02/25/20 0727 02/28/20 0947  NA 136   < > 140  K 3.6   < > 3.8  CL 107   < > 103  CO2 18*   < > 23  GLUCOSE 98   < > 100*  BUN 6   < > 18  CREATININE 0.89   < > 0.74  CALCIUM 8.0*   < > 8.2*  MG 1.9  --   --   AST 45*   < > 49*  ALT 24   < > 29  ALKPHOS 164*   < > 104  BILITOT 0.6   < > 0.6   < > = values in this interval not displayed.   RADIOLOGY:  No results found. ASSESSMENT AND PLAN:  39 year old female with history of depression, migraine, eating disorder seen in consultation for pneumonia from Covid.  Acute hypoxic respiratory failure due to Pneumonia from COVID-19 Patient weaned off to room air now but will require 2 L oxygen via nasal cannula due to her incident last evening where her oxygen saturation dropped in 80s with minimal ambulation.  She qualifies for oxygen. Completed course of remdesivir and will be discharged on tapering steroids.  CT chest last night negative for PE but showed extensive bilateral consolidation.  Community-acquired bacterial pneumonia Procalcitonin was more than 1->0.8 CT chest showed extensive bilateral consolidation Continue Rocephin and Zithromax -switched to oral Augmentin for 7 more days at discharge  Depression Continue Lexapro  Body mass index is 46.06 kg/m.    I have sent prescriptions of prednisone, Augmentin, vitamin C and zinc to her pharmacy electronically.  Patient's nurse patient and her mother are aware of this.  Her oxygen is set up by [facility] team.  She is  medically stable for discharge.  She will need outpatient follow-up with her PCP and pulmonologist Dr. Belia Heman.       Family Communication: Updated mother at bedside   All the records are reviewed and case discussed with Care Management/Social Worker. Management plans discussed with the patient, family (mother at bedside) and they are in agreement.  CODE STATUS: Full Code  TOTAL TIME TAKING CARE OF THIS PATIENT: 25 minutes.   More than 50% of the time was spent in counseling/coordination of care: YES   Delfino Lovett M.D on 02/28/2020 at 11:42 AM  [facility]   CC: Primary care physician; Allegra Grana, FNP  Note: This dictation was prepared with Dragon dictation along with smaller phrase technology. Any transcriptional errors that result from this process are unintentional.

## 2020-02-28 NOTE — Progress Notes (Signed)
Pt questioned if she needed to continue taking lovenox once discharged. Per Dr. Sherryll Burger pt does not need to continue lovenox at home

## 2020-02-28 NOTE — Progress Notes (Signed)
SATURATION QUALIFICATIONS: (This note is used to comply with regulatory documentation for home oxygen)  Patient Saturations on Room Air at Rest = 92%  Patient Saturations on Room Air while Ambulating = 86%  Patient Saturations on 2 Liters of oxygen while Ambulating = 91%  Please briefly explain why patient needs home oxygen: pts oxygen drops while ambulating

## 2020-02-28 NOTE — Progress Notes (Signed)
Pt states she got up to go to the bathroom and suddenly became short of breath and stated "it burns with each breath." Pts heart rate got to 135 and O2 saturation dropped to 80%. RN to assess lung sounds and pt bilaterally clear but diminished, unlabored and symmetrical. 2L O2 nasal cannula applied. Ouma NP notified. Orders placed by provider and instructions to continue pt on 2L O2 on nasal cannula. Pts mother at bedside.

## 2020-02-28 NOTE — Progress Notes (Signed)
Pt given discharge instructions including follow up appts, meds and care of incision. Pt verbalized understanding. Pt discharged stable with mother and infant

## 2020-02-28 NOTE — TOC Progression Note (Signed)
Transition of [facility]) - Progression Note    Patient Details  Name: Sue Hernandez MRN: [id number] Date of Birth: 01-13-1981  [facility] CM/SW Contact  Larwance Rote, LCSW Phone Number: 02/28/2020, 11:22 AM  Clinical Narrative:   Social worker coordinated order for home DME/Oxygen 2L with [employer].  Spoke to [employer] [phone]. 2L oxygen will be delivered to [facility] to pt's bedside by [facility].       Expected Discharge Plan and Services

## 2020-02-28 NOTE — Discharge Instructions (Signed)
Follow up with PCP and OBGYN Call pulmonologist (Dr. Belia HemanKasa) for follow up appt in about 2 weeks     Postpartum Care After Cesarean Delivery This sheet gives you information about how to care for yourself from the time you deliver your baby to up to 6-12 weeks after delivery (postpartum period). Your health care provider may also give you more specific instructions. If you have problems or questions, contact your health care provider. Follow these instructions at home: Medicines  Take over-the-counter and prescription medicines only as told by your health care provider.  If you were prescribed an antibiotic medicine, take it as told by your health care provider. Do not stop taking the antibiotic even if you start to feel better.  Ask your health care provider if the medicine prescribed to you: ? Requires you to avoid driving or using heavy machinery. ? Can cause constipation. You may need to take actions to prevent or treat constipation, such as:  Drink enough fluid to keep your urine pale yellow.  Take over-the-counter or prescription medicines.  Eat foods that are high in fiber, such as beans, whole grains, and fresh fruits and vegetables.  Limit foods that are high in fat and processed sugars, such as fried or sweet foods. Activity  Gradually return to your normal activities as told by your health care provider.  Avoid activities that take a lot of effort and energy (are strenuous) until approved by your health care provider. Walking at a slow to moderate pace is usually safe. Ask your health care provider what activities are safe for you. ? Do not lift anything that is heavier than your baby or 10 lb (4.5 kg) as told by your health care provider. ? Do not vacuum, climb stairs, or drive a car for as long as told by your health care provider.  If possible, have someone help you at home until you are able to do your usual activities yourself.  Rest as much as possible. Try to rest  or take naps while your baby is sleeping. Vaginal bleeding  It is normal to have vaginal bleeding (lochia) after delivery. Wear a sanitary pad to absorb vaginal bleeding and discharge. ? During the first week after delivery, the amount and appearance of lochia is often similar to a menstrual period. ? Over the next few weeks, it will gradually decrease to a dry, yellow-brown discharge. ? For most women, lochia stops completely by 4-6 weeks after delivery. Vaginal bleeding can vary from woman to woman.  Change your sanitary pads frequently. Watch for any changes in your flow, such as: ? A sudden increase in volume. ? A change in color. ? Large blood clots.  If you pass a blood clot, save it and call your health care provider to discuss. Do not flush blood clots down the toilet before you get instructions from your health care provider.  Do not use tampons or douches until your health care provider says this is safe.  If you are not breastfeeding, your period should return 6-8 weeks after delivery. If you are breastfeeding, your period may return anytime between 8 weeks after delivery and the time that you stop breastfeeding. Perineal care   If your C-section (Cesarean section) was unplanned, and you were allowed to labor and push before delivery, you may have pain, swelling, and discomfort of the tissue between your vaginal opening and your anus (perineum). You may also have an incision in the tissue (episiotomy) or the tissue may have torn  during delivery. Follow these instructions as told by your health care provider: ? Keep your perineum clean and dry as told by your health care provider. Use medicated pads and pain-relieving sprays and creams as directed. ? If you have an episiotomy or vaginal tear, check the area every day for signs of infection. Check for:  Redness, swelling, or pain.  Fluid or blood.  Warmth.  Pus or a bad smell. ? You may be given a squirt bottle to use instead  of wiping to clean the perineum area after you go to the bathroom. As you start healing, you may use the squirt bottle before wiping yourself. Make sure to wipe gently. ? To relieve pain caused by an episiotomy, vaginal tear, or hemorrhoids, try taking a warm sitz bath 2-3 times a day. A sitz bath is a warm water bath that is taken while you are sitting down. The water should only come up to your hips and should cover your buttocks. Breast care  Within the first few days after delivery, your breasts may feel heavy, full, and uncomfortable (breast engorgement). You may also have milk leaking from your breasts. Your health care provider can suggest ways to help relieve breast discomfort. Breast engorgement should go away within a few days.  If you are breastfeeding: ? Wear a bra that supports your breasts and fits you well. ? Keep your nipples clean and dry. Apply creams and ointments as told by your health care provider. ? You may need to use breast pads to absorb milk leakage. ? You may have uterine contractions every time you breastfeed for several weeks after delivery. Uterine contractions help your uterus return to its normal size. ? If you have any problems with breastfeeding, work with your health care provider or a Advertising copywriter.  If you are not breastfeeding: ? Avoid touching your breasts as this can make your breasts produce more milk. ? Wear a well-fitting bra and use cold packs to help with swelling. ? Do not squeeze out (express) milk. This causes you to make more milk. Intimacy and sexuality  Ask your health care provider when you can engage in sexual activity. This may depend on your: ? Risk of infection. ? Healing rate. ? Comfort and desire to engage in sexual activity.  You are able to get pregnant after delivery, even if you have not had your period. If desired, talk with your health care provider about methods of family planning or birth control  (contraception). Lifestyle  Do not use any products that contain nicotine or tobacco, such as cigarettes, e-cigarettes, and chewing tobacco. If you need help quitting, ask your health care provider.  Do not drink alcohol, especially if you are breastfeeding. Eating and drinking   Drink enough fluid to keep your urine pale yellow.  Eat high-fiber foods every day. These may help prevent or relieve constipation. High-fiber foods include: ? Whole grain cereals and breads. ? Brown rice. ? Beans. ? Fresh fruits and vegetables.  Take your prenatal vitamins until your postpartum checkup or until your health care provider tells you it is okay to stop. General instructions  Keep all follow-up visits for you and your baby as told by your health care provider. Most women visit their health care provider for a postpartum checkup within the first 3-6 weeks after delivery. Contact a health care provider if you:  Feel unable to cope with the changes that a new baby brings to your life, and these feelings do not go  away.  Feel unusually sad or worried.  Have breasts that are painful, hard, or turn red.  Have a fever.  Have trouble holding urine or keeping urine from leaking.  Have little or no interest in activities you used to enjoy.  Have not breastfed at all and you have not had a menstrual period for 12 weeks after delivery.  Have stopped breastfeeding and you have not had a menstrual period for 12 weeks after you stopped breastfeeding.  Have questions about caring for yourself or your baby.  Pass a blood clot from your vagina. Get help right away if you:  Have chest pain.  Have difficulty breathing.  Have sudden, severe leg pain.  Have severe pain or cramping in your abdomen.  Bleed from your vagina so much that you fill more than one sanitary pad in one hour. Bleeding should not be heavier than your heaviest period.  Develop a severe headache.  Faint.  Have blurred  vision or spots in your vision.  Have a bad-smelling vaginal discharge.  Have thoughts about hurting yourself or your baby. If you ever feel like you may hurt yourself or others, or have thoughts about taking your own life, get help right away. You can go to your nearest emergency department or call:  Your local emergency services (911 in the [place]  A suicide crisis helpline, such as the National Suicide Prevention Lifeline at 775-717-0733. This is open 24 hours a day. Summary  The period of time from when you deliver your baby to up to 6-12 weeks after delivery is called the postpartum period.  Gradually return to your normal activities as told by your health care provider.  Keep all follow-up visits for you and your baby as told by your health care provider. This information is not intended to replace advice given to you by your health care provider. Make sure you discuss any questions you have with your health care provider. Document Revised: 02/27/2018 Document Reviewed: 02/27/2018 Elsevier Patient Education  2020 [employer].   Postpartum Baby Blues The postpartum period begins right after the birth of a baby. During this time, there is often a lot of joy and excitement. It is also a time of many changes in the life of the parents. No matter how many times a mother gives birth, each child brings new challenges to the family, including different ways of relating to one another. It is common to have feelings of excitement along with confusing changes in moods, emotions, and thoughts. You may feel happy one minute and sad or stressed the next. These feelings of sadness usually happen in the period right after you have your baby, and they go away within a week or two. This is called the "baby blues." What are the causes? There is no known cause of baby blues. It is likely caused by a combination of factors. However, changes in hormone levels after childbirth are believed to trigger  some of the symptoms. Other factors that can play a role in these mood changes include:  Lack of sleep.  Stressful life events, such as poverty, caring for a loved one, or death of a loved one.  Genetics. What are the signs or symptoms? Symptoms of this condition include:  Brief changes in mood, such as going from extreme happiness to sadness.  Decreased concentration.  Difficulty sleeping.  Crying spells and tearfulness.  Loss of appetite.  Irritability.  Anxiety. If the symptoms of baby blues last for more than 2  weeks or become more severe, you may have postpartum depression. How is this diagnosed? This condition is diagnosed based on an evaluation of your symptoms. There are no medical or lab tests that lead to a diagnosis, but there are various questionnaires that a health care provider may use to identify women with the baby blues or postpartum depression. How is this treated? Treatment is not needed for this condition. The baby blues usually go away on their own in 1-2 weeks. Social support is often all that is needed. You will be encouraged to get adequate sleep and rest. Follow these instructions at home: Lifestyle      Get as much rest as you can. Take a nap when the baby sleeps.  Exercise regularly as told by your health care provider. Some women find yoga and walking to be helpful.  Eat a balanced and nourishing diet. This includes plenty of fruits and vegetables, whole grains, and lean proteins.  Do little things that you enjoy. Have a cup of tea, take a bubble bath, read your favorite magazine, or listen to your favorite music.  Avoid alcohol.  Ask for help with household chores, cooking, grocery shopping, or running errands. Do not try to do everything yourself. Consider hiring a postpartum doula to help. This is a professional who specializes in providing support to new mothers.  Try not to make any major life changes during pregnancy or right after  giving birth. This can add stress. General instructions  Talk to people close to you about how you are feeling. Get support from your partner, family members, friends, or other new moms. You may want to join a support group.  Find ways to cope with stress. This may include: ? Writing your thoughts and feelings in a journal. ? Spending time outside. ? Spending time with people who make you laugh.  Try to stay positive in how you think. Think about the things you are grateful for.  Take over-the-counter and prescription medicines only as told by your health care provider.  Let your health care provider know if you have any concerns.  Keep all postpartum visits as told by your health care provider. This is important. Contact a health care provider if:  Your baby blues do not go away after 2 weeks. Get help right away if:  You have thoughts of taking your own life (suicidal thoughts).  You think you may harm the baby or other people.  You see or hear things that are not there (hallucinations). Summary  After giving birth, you may feel happy one minute and sad or stressed the next. Feelings of sadness that happen right after the baby is born and go away after a week or two are called the "baby blues."  You can manage the baby blues by getting enough rest, eating a healthy diet, exercising, spending time with supportive people, and finding ways to cope with stress.  If feelings of sadness and stress last longer than 2 weeks or get in the way of caring for your baby, talk to your health care provider. This may mean you have postpartum depression. This information is not intended to replace advice given to you by your health care provider. Make sure you discuss any questions you have with your health care provider. Document Revised: 11/01/2018 Document Reviewed: 09/05/2016 Elsevier Patient Education  2020 [employer].

## 2020-02-29 LAB — CULTURE, BLOOD (ROUTINE X 2)
Culture: NO GROWTH
Culture: NO GROWTH

## 2020-03-01 ENCOUNTER — Encounter: Payer: Self-pay | Admitting: Nurse Practitioner

## 2020-03-01 ENCOUNTER — Telehealth: Payer: Self-pay

## 2020-03-01 NOTE — Telephone Encounter (Signed)
Transition Care Management Follow-up Telephone Call  Date of discharge and from where: 02/28/20 from [facility]  How have you been since you were released from the hospital? I am little winded with cough at times. Cough produces cloudy yellow mucus. Oxygen is only needed after a coughing spell, set @ 2L. Sat 90-92 on room air and no cough.  Denies nausea, chills, fever, vomiting, diarrhea, headache and all other symptoms. Input/output appropriate. Drinking plenty of fluids.    Any questions or concerns? No  Items Reviewed:  Did the pt receive and understand the discharge instructions provided? Yes   Medications obtained and verified? Yes   Any new allergies since your discharge? No   Dietary orders reviewed? Yes  Do you have support at home? Yes   Functional Questionnaire: (I = Independent and D = Dependent) ADLs: i  Bathing/Dressing- i  Eating- i  Maintaining continence- i  Transferring/Ambulation- i  Managing Meds- i  Follow up appointments reviewed:   PCP Hospital f/u appt confirmed? Yes  Scheduled to see Amedeo Kinsman on 03/02/20 @ 8:00 via mychart.  Specialist?  Scheduled to see Pulmonary and OBGYN.  Are transportation arrangements needed? No   If their condition worsens, is the pt aware to call PCP or go to the Emergency Dept.? Yes  Was the patient provided with contact information for the PCP's office or ED? Yes  Was to pt encouraged to call back with questions or concerns? Yes

## 2020-03-02 ENCOUNTER — Encounter: Payer: Self-pay | Admitting: Nurse Practitioner

## 2020-03-02 ENCOUNTER — Telehealth (INDEPENDENT_AMBULATORY_CARE_PROVIDER_SITE_OTHER): Payer: Medicaid Other | Admitting: Nurse Practitioner

## 2020-03-02 VITALS — HR 82 | Temp 97.8°F | Ht 63.0 in | Wt 250.0 lb

## 2020-03-02 DIAGNOSIS — U071 COVID-19: Secondary | ICD-10-CM

## 2020-03-02 DIAGNOSIS — Z9889 Other specified postprocedural states: Secondary | ICD-10-CM

## 2020-03-02 DIAGNOSIS — J1282 Pneumonia due to coronavirus disease 2019: Secondary | ICD-10-CM | POA: Diagnosis not present

## 2020-03-02 NOTE — Progress Notes (Signed)
Virtual Visit via Virtual Note  This visit type was conducted due to national recommendations for restrictions regarding the COVID-19 pandemic (e.g. social distancing).  This format is felt to be most appropriate for this patient at this time.  All issues noted in this document were discussed and addressed.  No physical exam was performed (except for noted visual exam findings with Video Visits).   I connected with@ on 03/02/20 at  8:00 AM EDT by a video enabled telemedicine application or telephone and verified that I am speaking with the correct person using two identifiers. Location patient: home Location provider: work or home office Persons participating in the virtual visit: patient, provider  I discussed the limitations, risks, security and privacy concerns of performing an evaluation and management service by telephone and the availability of in person appointments. I also discussed with the patient that there may be a patient responsible charge related to this service. The patient expressed understanding and agreed to proceed.   Reason for visit: Follow up Covid bilateral pneumonia and C section on 02/24/2020 and discharged 02/28/2020. She sees GYN and PULM next week.   HPI: 39 yo reports she was [redacted] weeks pregnant and developed her first Covid symptoms on 03/19/2020, and she tested positive for Covid on 03/20/2020. Patient stayed home for the week and developed shortness of breath, and was admitted to the hospital with Covid bilateral pneumonia and diarrhea on 02/24/2020.  She underwent a C-section by spinal and delivered a healthy baby boy, Kellan on 02/24/2020. She had an unremarkable postpartum.   Patient She was discharged on Saturday- 3 days ago.  The medical discharge note is not available for review.  The OB/GYN discharge note was reviewed.  She  is "feeling pretty good." The residual Covid symptoms are foggy brained and still has a mild cough. She gets SOB only after a coughing spell and has  not used her supplemental oxygen at all last night- first night not needed it.  She has been checking her pulse ox- and reports her lung doctor wanted her sats to be 88-90%- after a coughing spell.  Currently, while sitting still and resting sats are 92% on room air. Jay climbed the stairs yest and took a shower. No more lightheaded  or feeling SOB with exertion. Her dyspnea is definitely better. No CP, pressure, tightness and feels like she is getting her air in and out easily.  She is not taking any cough medicine and declines any medications today as it is not that bad.  She is not coughing much at night.  She says her cough is overall nonproductive but when a little mildly yellow sputum.  Her foggy brain symptom is that it takes her longer time to process information.  He does not think it is worse than when it was in the hospital, just more apparent now that she is at home and having to maintain normal ADLs. She does have decreased appetite.  She is still drinking 1 protein shake a day.  She is eating a lot of fruit and she seems to like that as well as a spinach salad with chicken.  No diarrhea. She had a normal formed stool last night.    She has not needed to take any narcotic medication.  Her C-section is healing very well.  She is not lactating.  She is taking care of the baby without difficulty and her mother is a Engineer, civil (consulting) and has been with her.  She feels well supported and able  to rest.   Past Medical History:  Diagnosis Date  . Depression    Dr. Ardelle Anton  . Eating disorder    bulemia  . Migraines     Past Surgical History:  Procedure Laterality Date  . CESAREAN SECTION N/A 10/07/2016   Procedure: CESAREAN SECTION;  Surgeon: Christeen Douglas, MD;  Location: [facility] ORS;  Service: Obstetrics;  Laterality: N/A;  . CESAREAN SECTION N/A 02/24/2020   Procedure: CESAREAN SECTION;  Surgeon: Feliberto Gottron Ihor Austin, MD;  Location: [facility] ORS;  Service: Obstetrics;  Laterality: N/A;  . CHOLECYSTECTOMY   2009  . LAPAROSCOPIC GASTRIC BANDING  2009   Dr. Terrill Mohr  . LAPAROSCOPIC GASTRIC SLEEVE RESECTION  01/2013  . TONSILLECTOMY  2006  . UPPER GI ENDOSCOPY  2014   hiatial hernia, Dr. Smitty Cords    Family History  Problem Relation Age of Onset  . Cancer Maternal Grandmother        lung  . Cancer Paternal Grandmother        Breast  . Heart disease Paternal Grandfather   . Cancer Paternal Grandfather        renal cancer went to his lungs  . Cancer Maternal Grandfather        lung    SOCIAL HX: Non smoker   Current Outpatient Medications:  .  amoxicillin-clavulanate (AUGMENTIN) 875-125 MG tablet, Take 1 tablet by mouth every 12 (twelve) hours for 7 days., Disp: 14 tablet, Rfl: 0 .  ascorbic acid (VITAMIN C) 500 MG tablet, Take 1 tablet (500 mg total) by mouth daily., Disp: 30 tablet, Rfl: 0 .  cyclobenzaprine (FLEXERIL) 5 MG tablet, Take 5 mg by mouth at bedtime., Disp: , Rfl:  .  escitalopram (LEXAPRO) 10 MG tablet, Take 10 mg by mouth daily., Disp: , Rfl:  .  predniSONE (STERAPRED UNI-PAK 21 TAB) 10 MG (21) TBPK tablet, Start 60 mg po daily, taper 10 mg po daily until done, Disp: 21 tablet, Rfl: 0 .  Prenatal Vit-Fe Fumarate-FA (PRENATAL MULTIVITAMIN) TABS tablet, Take 1 tablet by mouth daily at 12 noon., Disp: , Rfl:  .  zinc sulfate 220 (50 Zn) MG capsule, Take 1 capsule (220 mg total) by mouth daily., Disp: 30 capsule, Rfl: 0  EXAM:  VITALS per patient if applicable: pulse ox 92%, temp 97.8 , pulse 82  GENERAL: alert, oriented, appears well and in no acute distress. Looks well appearing.  Not fatigued looking.   HEENT: atraumatic, conjunctiva clear, no obvious abnormalities on inspection of external nose and ears  NECK: normal movements of the head and neck  LUNGS: On inspection no signs of respiratory distress, breathing rate appears normal, no obvious gross SOB, gasping or wheezing, Some cough.Speaking in full sentences very relaxed and no sign of dyspnea   CV: no obvious  cyanosis  MS: moves all visible extremities without noticeable abnormality  PSYCH/NEURO: pleasant and cooperative, no obvious depression or anxiety, speech and thought processing grossly intact  ASSESSMENT AND PLAN:  Discussed the following assessment and plan:  Pneumonia due to COVID-19 virus  Post-operative state  No problem-specific Assessment & Plan notes found for this encounter.    I discussed the assessment and treatment plan with the patient. The patient was provided an opportunity to ask questions and all were answered. The patient agreed with the plan and demonstrated an understanding of the instructions.   The patient was advised to call back or seek an in-person evaluation if the symptoms worsen or if the condition fails to improve as anticipated.  I provided 20  minutes of non-face-to-face time during this encounter.

## 2020-03-02 NOTE — Patient Instructions (Addendum)
Continue to follow-up with your OB/GYN as planned next week.  Continue to follow-up with your Pulmonary provider next Tuesday.  I do recommend that after coughing spell if you feel a little dyspneic to use the supplemental oxygen.  Especially if the pulse ox is 88 to 90%. Currently, on room air, you have a pulse ox 92% and are breathing easy and look comfortable.   It is important to continue to monitor for any worsening shortness of breath with any exertion, any dizziness, lightheadedness, unusual weakness, worsening foggy brain, or development of nausea, vomiting, diarrhea, sighs of dehydration and promptly call us back. Follow the discharge instructions for return to the ER. Hydrate well.  Advised to leave the house during recommended isolation period, only if it is necessary to seek medical care.   Complete the antibiotic and the prednisone as planned. Try to eat Yogurt- Activia - will give you probiotics after the antibiotics that you have had.   If you decide you do want something for cough give [place] a call back as needed.  Right nonpharmacologic cough medicine as needed.  Hydrate and drink a protein shake 2 times a day and  add Boost/Ensure as you are not eating 3 meals a day.   Please make a tele visit with Claris Che in 2 weeks for recheck.

## 2020-03-05 ENCOUNTER — Telehealth: Payer: Self-pay

## 2020-03-05 NOTE — Telephone Encounter (Signed)
Lmtcb.

## 2020-03-05 NOTE — Telephone Encounter (Signed)
Message from Fransico Setters, NP:  Please call her for a symptom update on Covid PNA. - pulse ox readings, DOE, appetite,brain fog, fatigue, overall improving daily?

## 2020-03-08 NOTE — Telephone Encounter (Signed)
Patient is feeling much better and only has a slight cough left over

## 2020-03-09 ENCOUNTER — Ambulatory Visit: Payer: Medicaid Other | Admitting: Pulmonary Disease

## 2020-03-09 ENCOUNTER — Encounter: Payer: Self-pay | Admitting: Pulmonary Disease

## 2020-03-09 ENCOUNTER — Other Ambulatory Visit: Payer: Self-pay

## 2020-03-09 VITALS — BP 100/70 | HR 104 | Temp 97.1°F | Ht 63.0 in | Wt 226.2 lb

## 2020-03-09 DIAGNOSIS — J1282 Pneumonia due to coronavirus disease 2019: Secondary | ICD-10-CM

## 2020-03-09 DIAGNOSIS — U071 COVID-19: Secondary | ICD-10-CM

## 2020-03-09 MED ORDER — BUDESONIDE-FORMOTEROL FUMARATE 160-4.5 MCG/ACT IN AERO: 2.0000 | INHALATION_SPRAY | Freq: Two times a day (BID) | RESPIRATORY_TRACT | 3 refills | Status: DC

## 2020-03-09 NOTE — Patient Instructions (Signed)
You on Symbicort 160/4.5, 2 inhalations twice a day.   Can discontinue your.  Will call [employer] to cancel that order.   We will see you in follow-up in 2 months time.

## 2020-04-06 DIAGNOSIS — O99893 Other specified diseases and conditions complicating puerperium: Secondary | ICD-10-CM | POA: Diagnosis not present

## 2020-04-06 DIAGNOSIS — Z1332 Encounter for screening for maternal depression: Secondary | ICD-10-CM | POA: Diagnosis not present

## 2020-04-06 DIAGNOSIS — D649 Anemia, unspecified: Secondary | ICD-10-CM | POA: Diagnosis not present

## 2020-04-06 DIAGNOSIS — E538 Deficiency of other specified B group vitamins: Secondary | ICD-10-CM | POA: Diagnosis not present

## 2020-04-06 DIAGNOSIS — Z124 Encounter for screening for malignant neoplasm of cervix: Secondary | ICD-10-CM | POA: Diagnosis not present

## 2020-04-06 DIAGNOSIS — L918 Other hypertrophic disorders of the skin: Secondary | ICD-10-CM | POA: Diagnosis not present

## 2020-04-13 DIAGNOSIS — E559 Vitamin D deficiency, unspecified: Secondary | ICD-10-CM | POA: Diagnosis not present

## 2020-04-13 DIAGNOSIS — L918 Other hypertrophic disorders of the skin: Secondary | ICD-10-CM | POA: Diagnosis not present

## 2020-04-13 DIAGNOSIS — O99893 Other specified diseases and conditions complicating puerperium: Secondary | ICD-10-CM | POA: Diagnosis not present

## 2020-04-13 DIAGNOSIS — Z6841 Body Mass Index (BMI) 40.0 and over, adult: Secondary | ICD-10-CM | POA: Diagnosis not present

## 2020-04-13 DIAGNOSIS — E538 Deficiency of other specified B group vitamins: Secondary | ICD-10-CM | POA: Diagnosis not present

## 2020-04-13 DIAGNOSIS — Z1332 Encounter for screening for maternal depression: Secondary | ICD-10-CM | POA: Diagnosis not present

## 2020-04-13 DIAGNOSIS — E539 Vitamin B deficiency, unspecified: Secondary | ICD-10-CM | POA: Diagnosis not present

## 2020-04-13 DIAGNOSIS — D649 Anemia, unspecified: Secondary | ICD-10-CM | POA: Diagnosis not present

## 2020-05-18 DIAGNOSIS — R899 Unspecified abnormal finding in specimens from other organs, systems and tissues: Secondary | ICD-10-CM | POA: Diagnosis not present

## 2020-05-27 DIAGNOSIS — R7989 Other specified abnormal findings of blood chemistry: Secondary | ICD-10-CM | POA: Diagnosis not present

## 2020-06-28 DIAGNOSIS — R7989 Other specified abnormal findings of blood chemistry: Secondary | ICD-10-CM | POA: Diagnosis not present

## 2020-07-08 DIAGNOSIS — R946 Abnormal results of thyroid function studies: Secondary | ICD-10-CM | POA: Diagnosis not present

## 2020-09-30 IMAGING — CR DG CHEST 2V
1 series · 2 of 2 positions shown · non-contrast
Comparison: Prior chest radiographs 07/25/2012

CLINICAL DATA: Tachycardia. Additional history provided: [REDACTED]
weeks pregnant, reportedly diagnosed with COVID last week,
difficulty breathing.

EXAM:
CHEST - 2 VIEW

[Series 1: dg chest 2 view · 0.14mm/px · 2 of 2 slices shown]
[im 1/2]
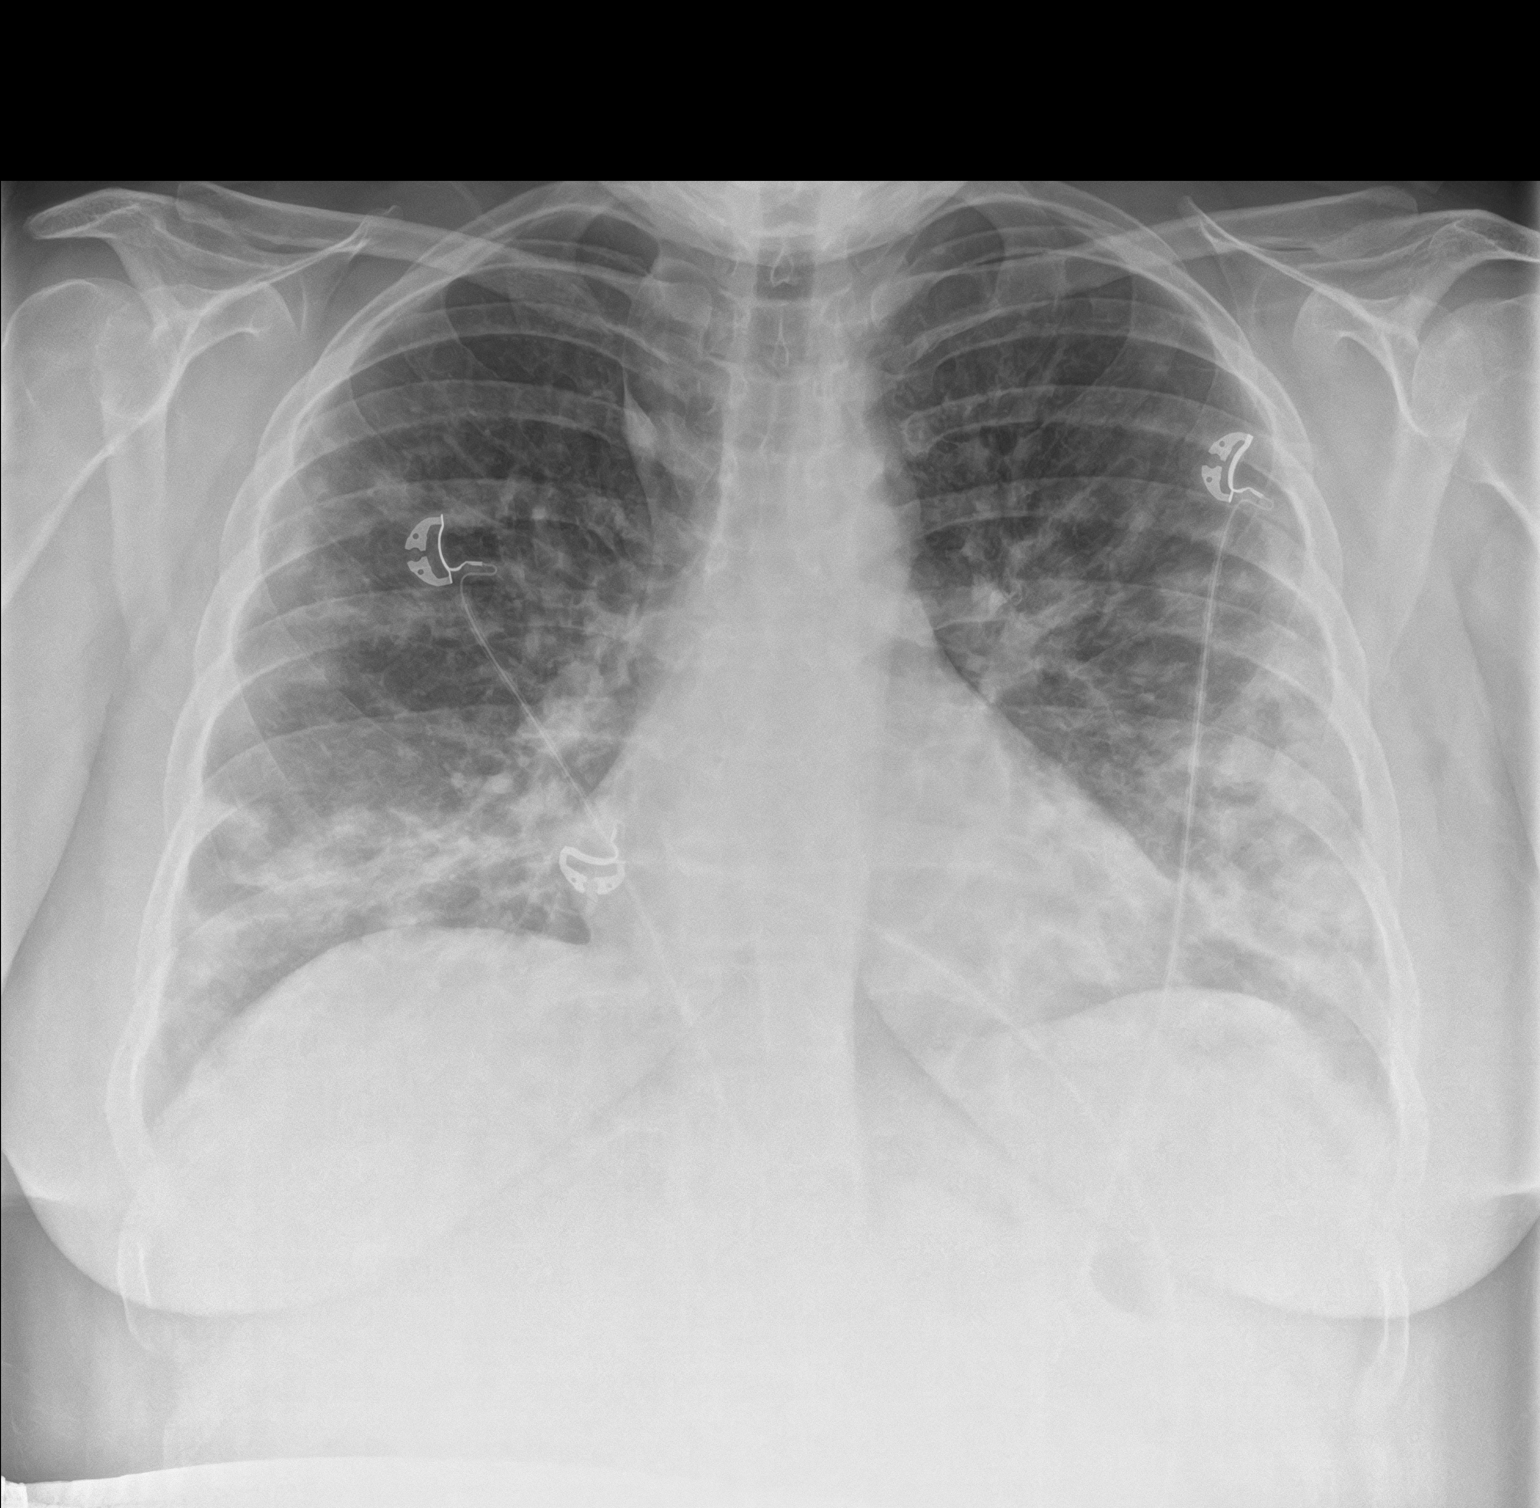
[im 2/2]
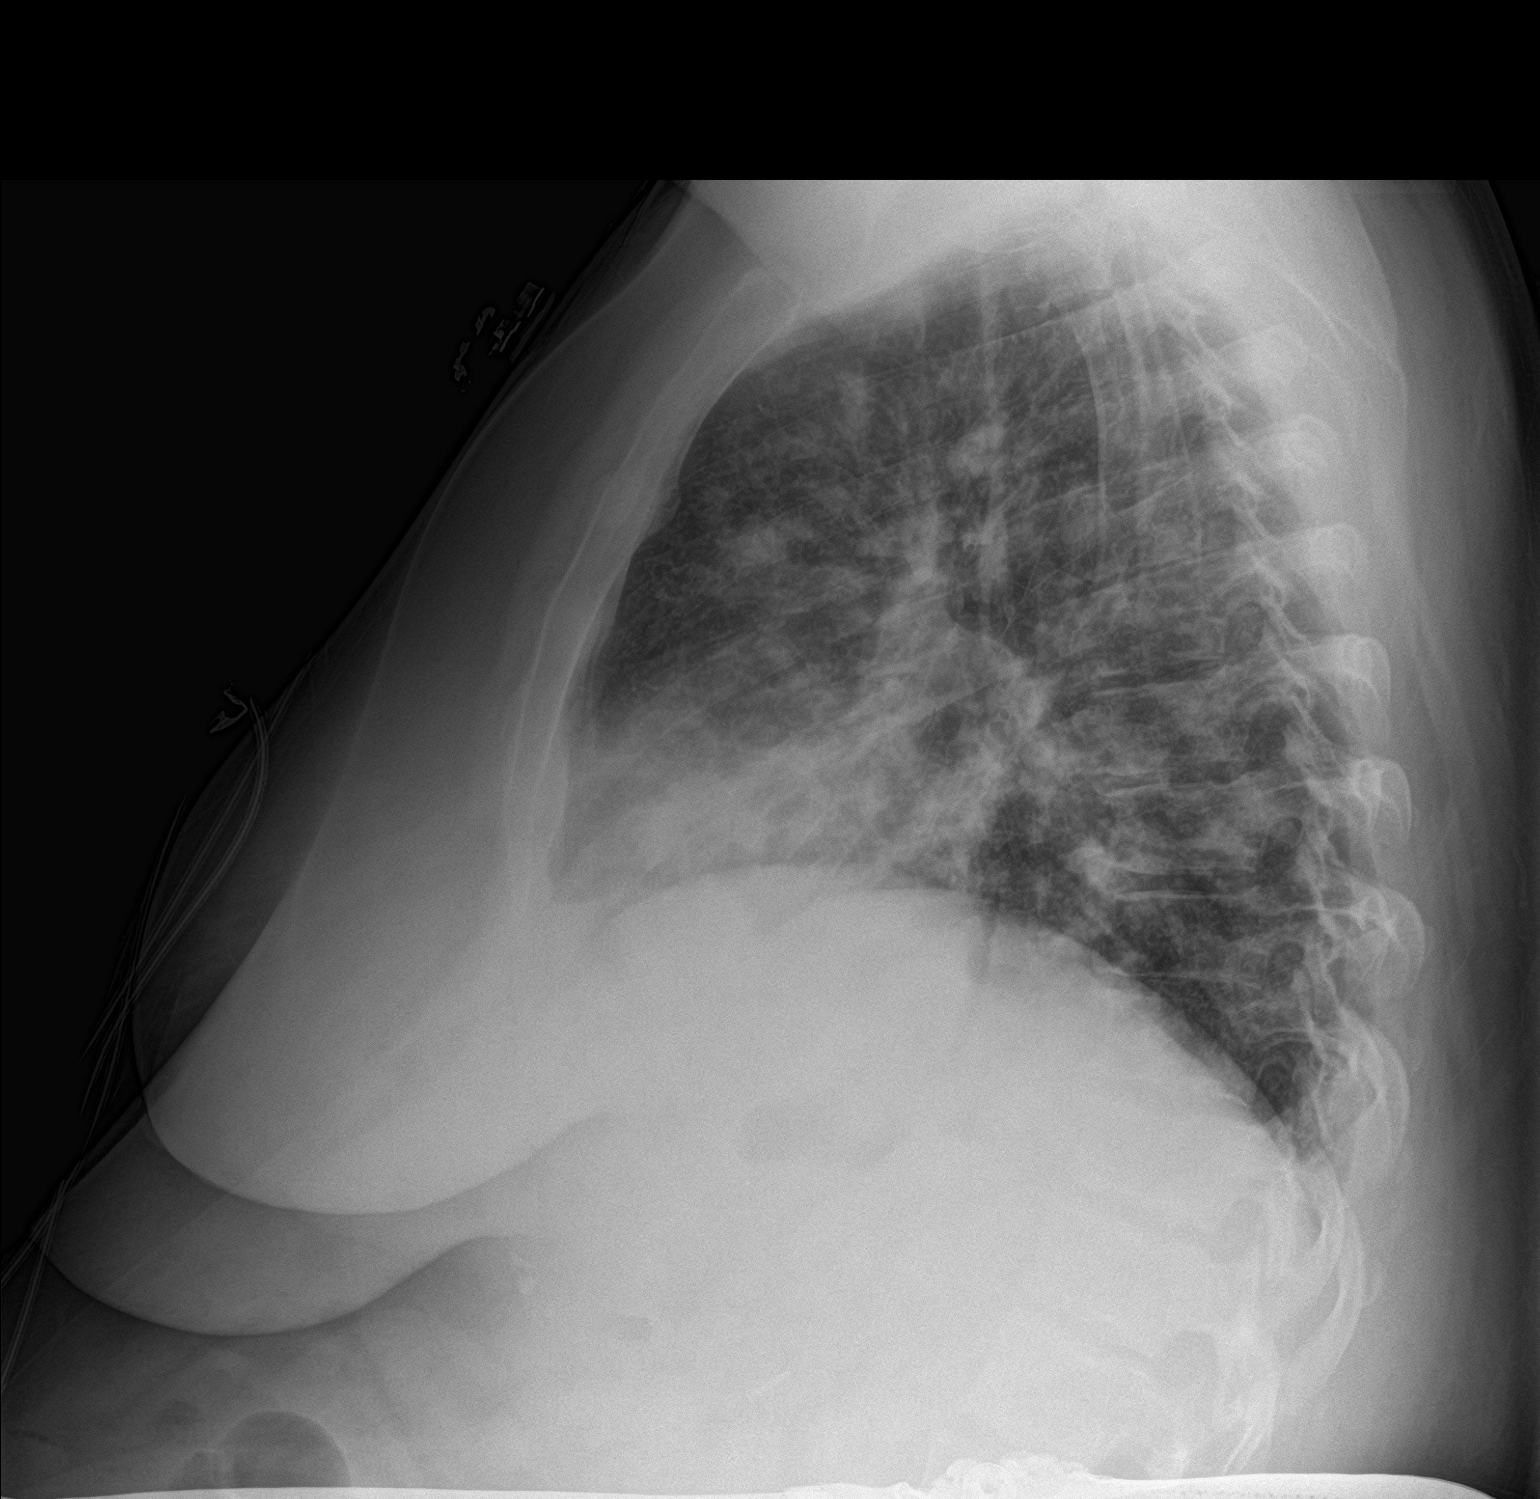

[2 of 2 positions shown; findings below may reference images not displayed]

FINDINGS: Heart size within normal limits. There are fairly extensive
multifocal airspace opacities bilaterally with a mid to lower lung
predominance. No evidence of pleural effusion or pneumothorax.
Incidentally noted azygos fissure. No acute bony abnormality
identified.
IMPRESSION: Fairly extensive bilateral multifocal airspace opacities with a mid
to lower lung predominance. These findings likely reflect multifocal
pneumonia given the provided history of COVID positivity.

## 2020-09-30 IMAGING — US US EXTREM LOW VENOUS
1 series · 14 of 24 positions shown · non-contrast
Comparison: None.

CLINICAL DATA: Tachycardia, shortness of breath.

EXAM:
Bilateral LOWER EXTREMITY VENOUS DOPPLER ULTRASOUND
TECHNIQUE: Gray-scale sonography with compression, as well as color and duplex
ultrasound, were performed to evaluate the deep venous system(s)
from the level of the common femoral vein through the popliteal and
proximal calf veins.

[Series 1: us extrem low venous · 0.08mm/px · 14 of 58 slices shown]
[im 1/58]
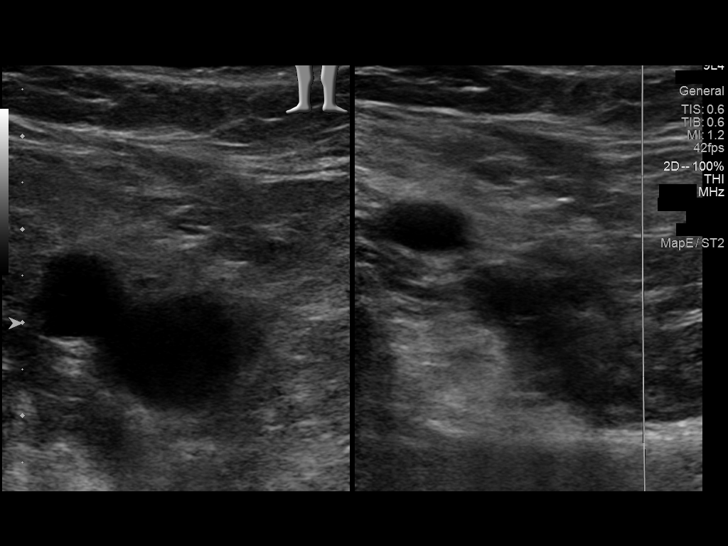
[im 5/58]
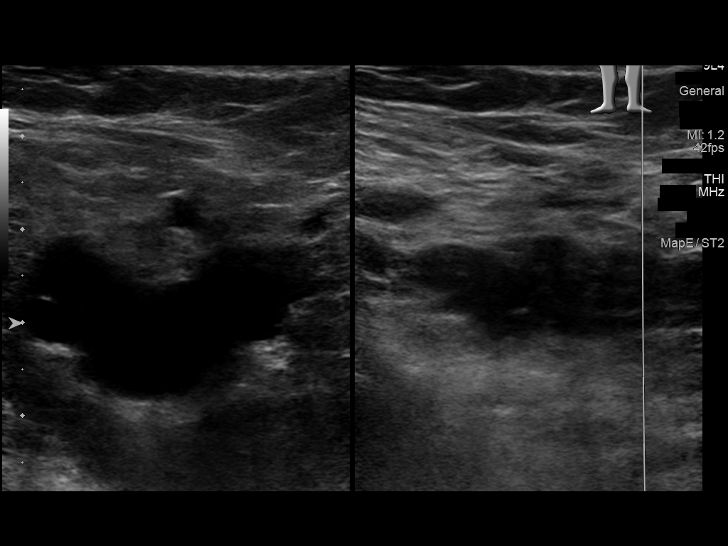
[im 10/58]
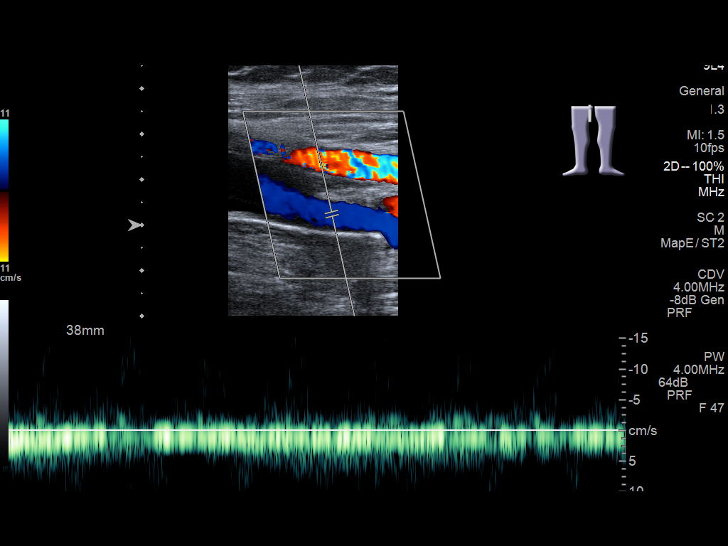
[im 15/58]
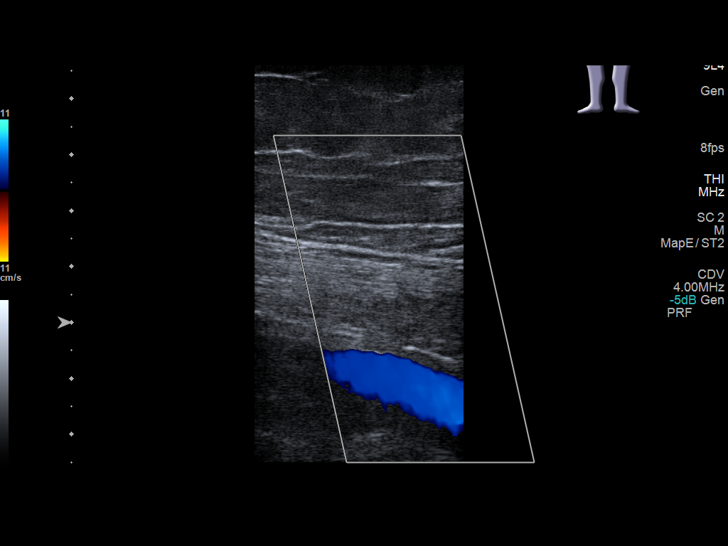
[im 18/58]
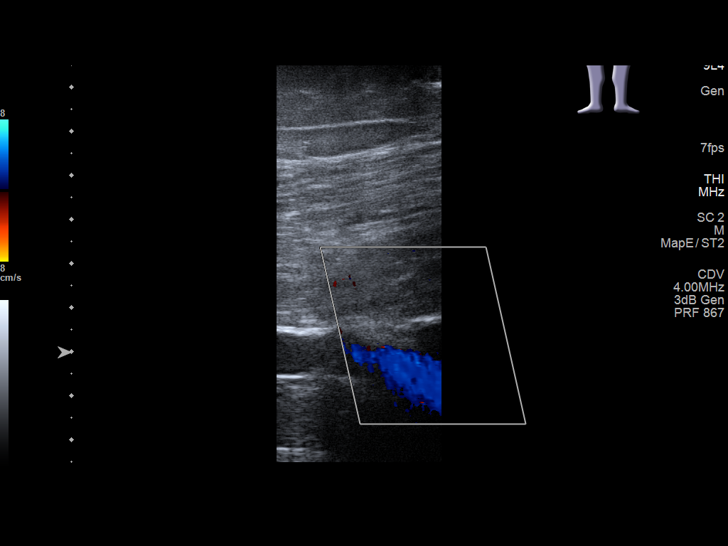
[im 23/58]
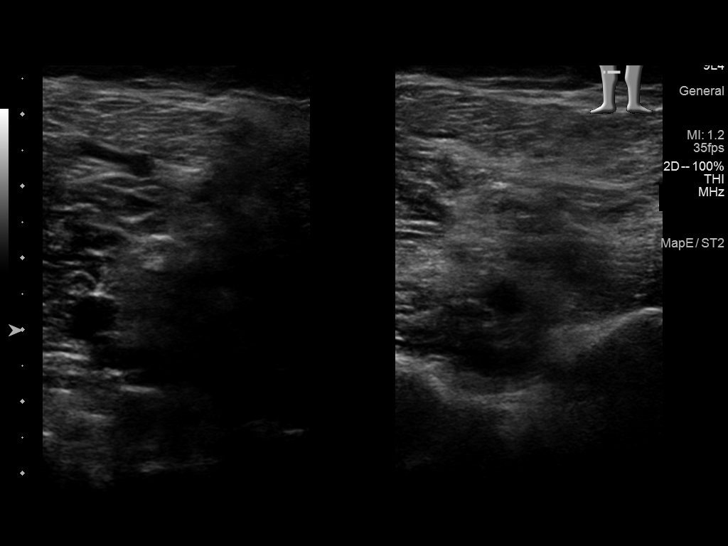
[im 28/58]
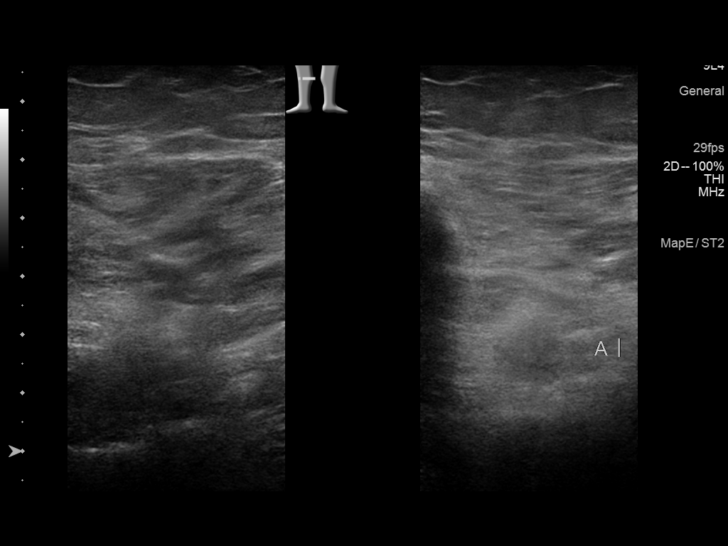
[im 30/58]
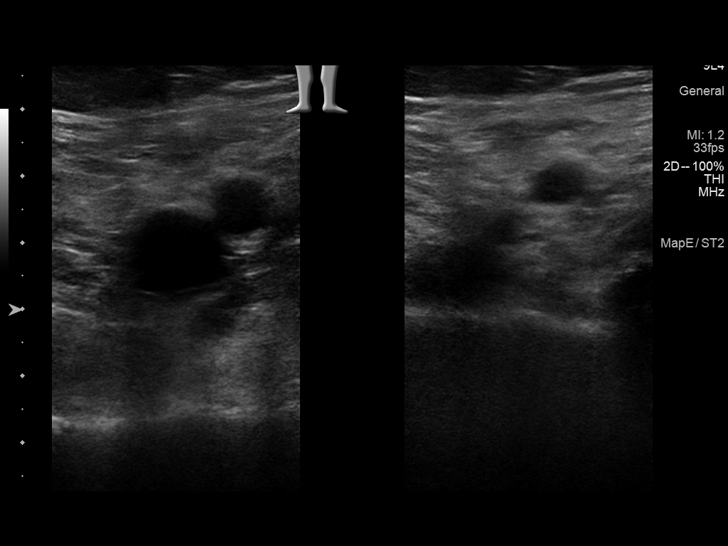
[im 35/58]
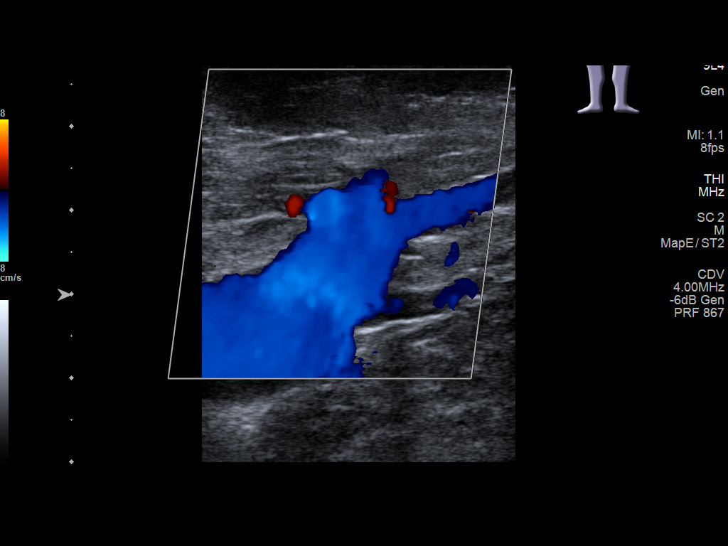
[im 40/58]
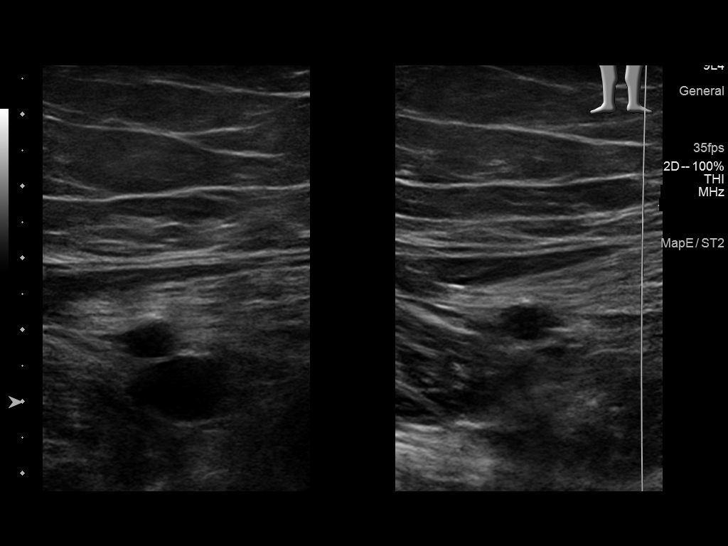
[im 45/58]
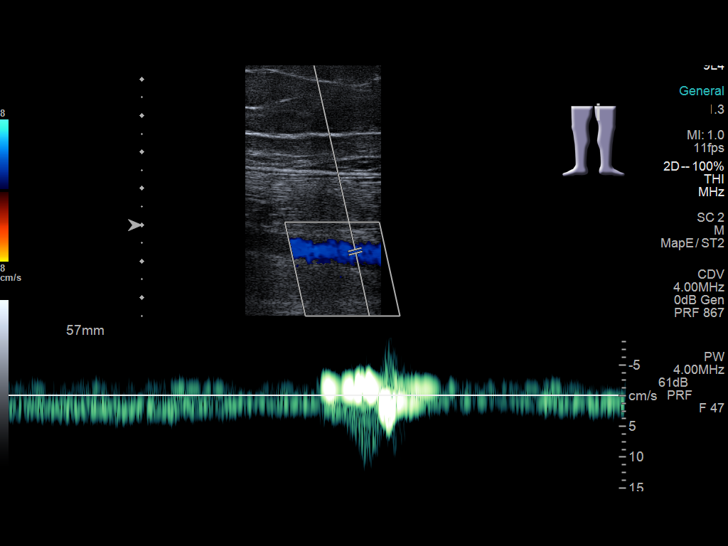
[im 48/58]
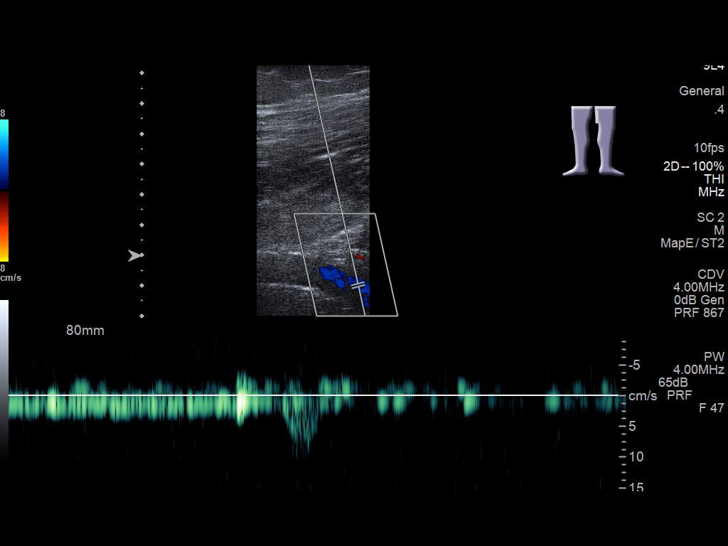
[im 53/58]
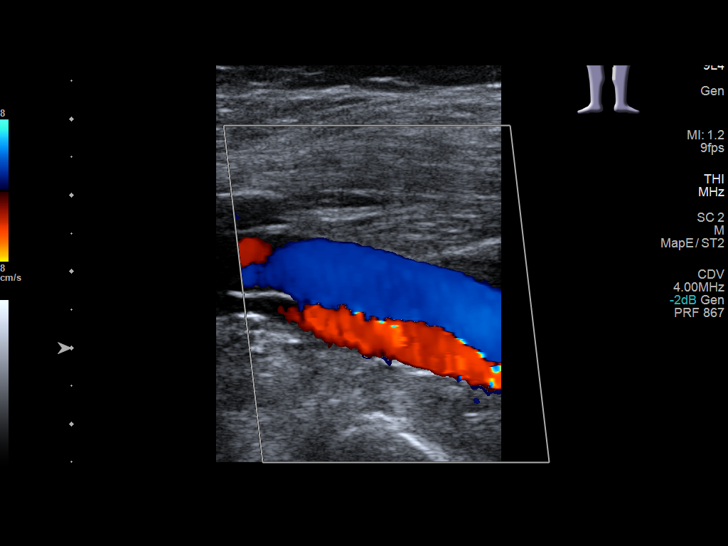
[im 58/58]
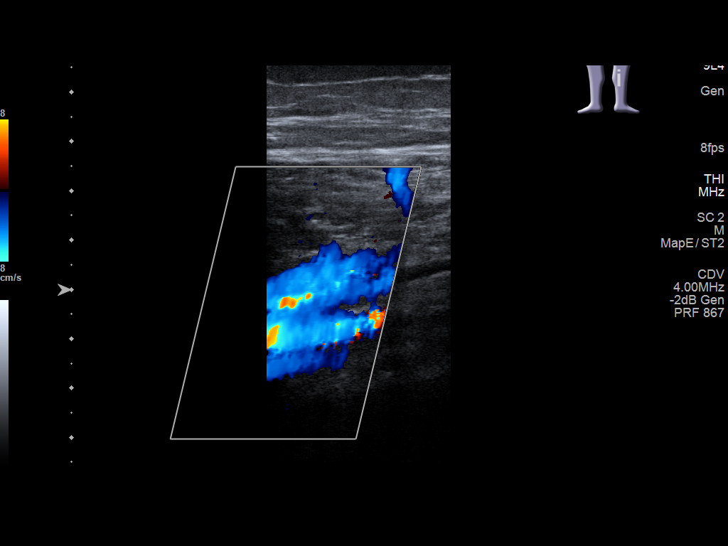

[14 of 24 positions shown; findings below may reference images not displayed]

FINDINGS: VENOUS

Normal compressibility of the common femoral, superficial femoral,
and popliteal veins, as well as the visualized calf veins.
Visualized portions of profunda femoral vein and great saphenous
vein unremarkable. No filling defects to suggest DVT on grayscale or
color Doppler imaging. Doppler waveforms show normal direction of
venous flow, normal respiratory plasticity and response to
augmentation.

Limited views of the contralateral common femoral vein are
unremarkable.

OTHER

None.

Limitations: none
IMPRESSION: No evidence of deep venous thrombosis seen in either lower
extremity.

## 2020-12-23 ENCOUNTER — Other Ambulatory Visit: Payer: Self-pay

## 2020-12-23 ENCOUNTER — Encounter: Payer: Self-pay | Admitting: Internal Medicine

## 2020-12-23 ENCOUNTER — Telehealth: Payer: Self-pay | Admitting: Family

## 2020-12-23 ENCOUNTER — Telehealth (INDEPENDENT_AMBULATORY_CARE_PROVIDER_SITE_OTHER): Payer: Medicaid Other | Admitting: Internal Medicine

## 2020-12-23 VITALS — Temp 98.2°F | Ht 63.0 in | Wt 245.0 lb

## 2020-12-23 DIAGNOSIS — B373 Candidiasis of vulva and vagina: Secondary | ICD-10-CM

## 2020-12-23 DIAGNOSIS — B3731 Acute candidiasis of vulva and vagina: Secondary | ICD-10-CM

## 2020-12-23 DIAGNOSIS — J321 Chronic frontal sinusitis: Secondary | ICD-10-CM

## 2020-12-23 MED ORDER — FLUTICASONE PROPIONATE 50 MCG/ACT NA SUSP: 2.0000 | Freq: Every day | NASAL | 2 refills | Status: DC | PRN

## 2020-12-23 MED ORDER — FLUCONAZOLE 150 MG PO TABS: 150.0000 mg | ORAL_TABLET | Freq: Once | ORAL | 0 refills | Status: AC

## 2020-12-23 MED ORDER — AZITHROMYCIN 250 MG PO TABS: ORAL_TABLET | ORAL | 0 refills | Status: AC

## 2020-12-23 NOTE — Progress Notes (Signed)
Onset of symptoms a week ago, her son had a cold a week ago. States her son was not around anyone confirmed with COVID.   Home test for COVID for her son was negative. Patient has not been tested.   Patient having headaches, facial pain/pressure, sore throat, and runny nose. Mucus is a yellow color.

## 2020-12-23 NOTE — Progress Notes (Signed)
Virtual Visit via Video Note  I connected with Sue Hernandez  on 12/23/20 at 11:00 AM EDT by a video enabled telemedicine application and verified that I am speaking with the correct person using two identifiers.  Location patient: home, [place] Location provider:work or home office Persons participating in the virtual visit: patient, provider  I discussed the limitations of evaluation and management by telemedicine and the availability of in person appointments. The patient expressed understanding and agreed to proceed.   HPI:  Sinus pain and pressure and h/a runny nose and sore throat pressure around eyes and cheeks h/o sinus surgery age 40 y.o and f/u [facility] tried  Tylenol saline rinse and claritin sx's x 2 days  40 y.o had cold and was covid 19 neg test recently and has 58 month old  She c/o taste being off due to congestion   -COVID-19 vaccine status: no had  ROS: See pertinent positives and negatives per HPI.  Past Medical History:  Diagnosis Date  . Depression    Dr. Ardelle Anton  . Eating disorder    bulemia  . Migraines     Past Surgical History:  Procedure Laterality Date  . CESAREAN SECTION N/A 10/07/2016   Procedure: CESAREAN SECTION;  Surgeon: Christeen Douglas, MD;  Location: [facility] OR[facility];  Service: Obstetrics;  Laterality: N/A;  . CESAREAN SECTION N/A 02/24/2020   Procedure: CESAREAN SECTION;  Surgeon: Feliberto Gottron Ihor Austin, MD;  Location: [facility] ORS;  Service: Obstetrics;  Laterality: N/A;  . CHOLECYSTECTOMY  2009  . LAPAROSCOPIC GASTRIC BANDING  2009   Dr. Terrill Mohr  . LAPAROSCOPIC GASTRIC SLEEVE RESECTION  01/2013  . TONSILLECTOMY  2006  . UPPER GI ENDOSCOPY  2014   hiatial hernia, Dr. Smitty Cords     Current Outpatient Medications:  .  azithromycin (ZITHROMAX) 250 MG tablet, Take 2 tablets on day 1, then 1 tablet daily on days 2 through 5, Disp: 6 tablet, Rfl: 0 .  escitalopram (LEXAPRO) 10 MG tablet, Take 10 mg by mouth daily., Disp: , Rfl:  .  fluconazole (DIFLUCAN) 150 MG tablet,  Take 1 tablet (150 mg total) by mouth once for 1 dose., Disp: 1 tablet, Rfl: 0 .  fluticasone (FLONASE) 50 MCG/ACT nasal spray, Place 2 sprays into both nostrils daily as needed for allergies or rhinitis., Disp: 16 g, Rfl: 2 .  propranolol (INDERAL) 20 MG tablet, Take 20 mg by mouth every morning., Disp: , Rfl:  .  budesonide-formoterol (SYMBICORT) 160-4.5 MCG/ACT inhaler, Inhale 2 puffs into the lungs 2 (two) times daily. (Patient not taking: Reported on 12/23/2020), Disp: 1 each, Rfl: 3 .  cyclobenzaprine (FLEXERIL) 5 MG tablet, Take 5 mg by mouth at bedtime. (Patient not taking: Reported on 12/23/2020), Disp: , Rfl:  .  ipratropium (ATROVENT HFA) 17 MCG/ACT inhaler, Inhale 2 puffs into the lungs every 4 (four) hours as needed for wheezing. (Patient not taking: Reported on 12/23/2020), Disp: , Rfl:  .  predniSONE (STERAPRED UNI-PAK 21 TAB) 10 MG (21) TBPK tablet, Start 60 mg po daily, taper 10 mg po daily until done (Patient not taking: No sig reported), Disp: 21 tablet, Rfl: 0 .  Prenatal Vit-Fe Fumarate-FA (PRENATAL MULTIVITAMIN) TABS tablet, Take 1 tablet by mouth daily at 12 noon. (Patient not taking: Reported on 12/23/2020), Disp: , Rfl:   EXAM:  VITALS per patient if applicable:  GENERAL: alert, oriented, appears well and in no acute distress  HEENT: atraumatic, conjunttiva clear, no obvious abnormalities on inspection of external nose and ears  NECK: normal  movements of the head and neck  LUNGS: on inspection no signs of respiratory distress, breathing rate appears normal, no obvious gross SOB, gasping or wheezing  CV: no obvious cyanosis  MS: moves all visible extremities without noticeable abnormality  PSYCH/NEURO: pleasant and cooperative, no obvious depression or anxiety, speech and thought processing grossly intact  ASSESSMENT AND PLAN:  Discussed the following assessment and plan:  Chronic frontal sinusitis s/p surgery 15 years ago- Plan: azithromycin (ZITHROMAX) 250 MG tablet,  fluticasone (FLONASE) 50 MCG/ACT nasal spray Cont ns, claritin F/u [facility] [place] if recurrent or sxs dont resolve  Yeast vaginitis - Plan: fluconazole (DIFLUCAN) 150 MG tablet  -we discussed possible serious and likely etiologies, options for evaluation and workup, limitations of telemedicine visit vs in person visit, treatment, treatment risks and precautions. Pt prefers to treat via telemedicine empirically rather than in person at this moment.   I discussed the assessment and treatment plan with the patient. The patient was provided an opportunity to ask questions and all were answered. The patient agreed with the plan and demonstrated an understanding of the instructions.    Time spent 20 min Bevelyn Buckles, MD

## 2020-12-23 NOTE — Telephone Encounter (Signed)
Noted  

## 2020-12-23 NOTE — Telephone Encounter (Signed)
PT called stating she wants to be seen for Sinus Infection. Advise has to be virtual and that [facility] Coop has nothing for today. They were okay with seeing a different provider and got them with TMS at 11:00 am for a virtual.

## 2021-03-25 ENCOUNTER — Ambulatory Visit: Payer: Medicaid Other | Admitting: Family

## 2021-04-01 ENCOUNTER — Ambulatory Visit: Payer: Medicaid Other | Admitting: Family

## 2021-04-04 ENCOUNTER — Other Ambulatory Visit: Payer: Self-pay

## 2021-04-04 ENCOUNTER — Encounter: Payer: Self-pay | Admitting: Family

## 2021-04-04 ENCOUNTER — Ambulatory Visit (INDEPENDENT_AMBULATORY_CARE_PROVIDER_SITE_OTHER): Payer: Medicaid Other | Admitting: Family

## 2021-04-04 VITALS — BP 98/76 | HR 107 | Temp 98.4°F | Ht 63.0 in | Wt 247.2 lb

## 2021-04-04 DIAGNOSIS — R5383 Other fatigue: Secondary | ICD-10-CM | POA: Diagnosis not present

## 2021-04-04 DIAGNOSIS — G43909 Migraine, unspecified, not intractable, without status migrainosus: Secondary | ICD-10-CM | POA: Diagnosis not present

## 2021-04-04 DIAGNOSIS — F32A Depression, unspecified: Secondary | ICD-10-CM

## 2021-04-04 MED ORDER — FLUOXETINE HCL 20 MG PO CAPS: 20.0000 mg | ORAL_CAPSULE | Freq: Every morning | ORAL | 3 refills | Status: DC

## 2021-04-04 NOTE — Progress Notes (Signed)
Subjective:    Patient ID: Sue Hernandez, female    DOB: 1981-07-22, 40 y.o.   MRN: [id number]  CC: Sue Hernandez is a 40 y.o. female who presents today for follow up.   HPI: She complains of worsening depression , over the past few weeks. Some anxiety.   Second pregnancy complicated by covid. He is one year old and has recurrent URIs.  Financial stress. Relationship with husband is healthy, supportive. Not sleeping well.  Decreased energy. Endorses fatigue.    She was on wellbutrin in the past with great relief. Compliant with lexapro 20mg  without relief. Tried prozac in the past and doesn't recall result.    Initially treated for depression 4 years ago.  She has 68 year and 39 year old.   She has reached out to counselor with [facility] and awaiting a phone call.   Weight is also bothering her.  She trouble with her weight her whole life.  She still struggles with bulimia has recurrent episodes.  No thoughts of hurting herself or anyone else.  No alcohol use, drug use.   No h/o bipolar. H/o b12 deficiency  No personal history of thyroid cancer.     H/o bulimia in 20's and occasionally now.   Migraine-compliant with propranolol 20mg  qd. HAs are well controlled.   Pap smear 04/06/21 HISTORY:  Past Medical History:  Diagnosis Date   COVID-19    fall 2021   Depression    Dr. 04/08/21   Eating disorder    bulemia   Migraines    Past Surgical History:  Procedure Laterality Date   CESAREAN SECTION N/A 10/07/2016   Procedure: CESAREAN SECTION;  Surgeon: Ardelle Anton, MD;  Location: [facility] ORS;  Service: Obstetrics;  Laterality: N/A;   CESAREAN SECTION N/A 02/24/2020   Procedure: CESAREAN SECTION;  Surgeon: Christeen Douglas, 04/25/2020, MD;  Location: [facility] ORS;  Service: Obstetrics;  Laterality: N/A;   CHOLECYSTECTOMY  2009   LAPAROSCOPIC GASTRIC BANDING  2009   Dr. 2010   LAPAROSCOPIC GASTRIC SLEEVE RESECTION  01/2013   TONSILLECTOMY  2006   UPPER GI ENDOSCOPY   2014   hiatial hernia, Dr. 02/2013   Family History  Problem Relation Age of Onset   Cancer Maternal Grandmother        lung   Cancer Maternal Grandfather        lung   Cancer Paternal Grandmother        Breast   Heart disease Paternal Grandfather    Cancer Paternal Grandfather        renal cancer went to his lungs   Colon cancer Neg Hx    Thyroid cancer Neg Hx     Allergies: Ibuprofen and Derma guard [protective adhesive powder] Current Outpatient Medications on File Prior to Visit  Medication Sig Dispense Refill   etonogestrel-ethinyl estradiol (NUVARING) 0.12-0.015 MG/24HR vaginal ring Place vaginally.     fluticasone (FLONASE) 50 MCG/ACT nasal spray Place 2 sprays into both nostrils daily as needed for allergies or rhinitis. 16 g 2   propranolol (INDERAL) 20 MG tablet Take 20 mg by mouth every morning.     No current facility-administered medications on file prior to visit.    Social History   Tobacco Use   Smoking status: Never   Smokeless tobacco: Never  Substance Use Topics   Alcohol use: No    Comment: Maybe once a month   Drug use: No    Review of Systems  Constitutional:  Positive  for fatigue. Negative for chills and fever.  Respiratory:  Negative for cough.   Cardiovascular:  Negative for chest pain and palpitations.  Gastrointestinal:  Negative for nausea and vomiting.  Psychiatric/Behavioral:  Positive for sleep disturbance. Negative for suicidal ideas. The patient is nervous/anxious.      Objective:    BP 98/76 (BP Location: Left Arm, Patient Position: Sitting, Cuff Size: Large)   Pulse (!) 107   Temp 98.4 F (36.9 C) (Oral)   Ht 5\' 3"  (1.6 m)   Wt 247 lb 3.2 oz (112.1 kg)   SpO2 98%   BMI 43.79 kg/m  BP Readings from Last 3 Encounters:  04/04/21 98/76  03/09/20 100/70  02/28/20 134/71   Wt Readings from Last 3 Encounters:  04/04/21 247 lb 3.2 oz (112.1 kg)  12/23/20 245 lb (111.1 kg)  03/09/20 226 lb 3.2 oz (102.6 kg)    Physical  Exam Vitals reviewed.  Constitutional:      Appearance: She is well-developed.  Eyes:     Conjunctiva/sclera: Conjunctivae normal.  Cardiovascular:     Rate and Rhythm: Normal rate and regular rhythm.     Pulses: Normal pulses.     Heart sounds: Normal heart sounds.  Pulmonary:     Effort: Pulmonary effort is normal.     Breath sounds: Normal breath sounds. No wheezing, rhonchi or rales.  Skin:    General: Skin is warm and dry.  Neurological:     Mental Status: She is alert.  Psychiatric:        Speech: Speech normal.        Behavior: Behavior normal.        Thought Content: Thought content normal.       Assessment & Plan:   Problem List Items Addressed This Visit       Cardiovascular and Mediastinum   Migraine    Well controlled. Continue propranolol 20mg  qd      Relevant Medications   FLUoxetine (PROZAC) 20 MG capsule     Other   Anxiety and depression - Primary    Uncontrolled. H/o bulminia; advised she may NOT take wellbutrin as lowers seizure threshold. Stop lexapro and start prozac as more activating. Pending baseline labs as well to look for metabolic etiology for fatigue which may be contributing to depression.  Phone numbers for local counselors provided to patient to discuss depression, anxiety and bulminia.  She will let me know if she requires a referral.  We also discussed weight the exacerbating factor for depression.  We will discuss further Wegovy at follow-up.  If Prozac not effective, we discussed trial of Effexor due to history of migraines.       Relevant Medications   FLUoxetine (PROZAC) 20 MG capsule   Other Visit Diagnoses     Fatigue, unspecified type       Relevant Orders   VITAMIN D 25 Hydroxy (Vit-D Deficiency, Fractures)   TSH   Comprehensive metabolic panel   Hemoglobin A1c   CBC with Differential/Platelet   Lipid panel        I have discontinued Dakota K. Brouhard's escitalopram, prenatal multivitamin, cyclobenzaprine,  predniSONE, ipratropium, budesonide-formoterol, and escitalopram. I am also having her start on FLUoxetine. Additionally, I am having her maintain her propranolol, fluticasone, and etonogestrel-ethinyl estradiol.   Meds ordered this encounter  Medications   FLUoxetine (PROZAC) 20 MG capsule    Sig: Take 1 capsule (20 mg total) by mouth every morning.    Dispense:  90 capsule  Refill:  3    Order Specific Question:   Supervising Provider    Answer:   Sherlene Shams [[id number]]     Return precautions given.   Risks, benefits, and alternatives of the medications and treatment plan prescribed today were discussed, and patient expressed understanding.   Education regarding symptom management and diagnosis given to patient on AVS.  Continue to follow with Allegra Grana, FNP for routine health maintenance.   Sue Hernandez and I agreed with plan.   Rennie Plowman, FNP

## 2021-04-04 NOTE — Assessment & Plan Note (Signed)
Well controlled. Continue propranolol 20mg  qd

## 2021-04-04 NOTE — Assessment & Plan Note (Addendum)
Uncontrolled. H/o bulminia; advised she may NOT take wellbutrin as lowers seizure threshold. Stop lexapro and start prozac as more activating. Pending baseline labs as well to look for metabolic etiology for fatigue which may be contributing to depression.  Phone numbers for local counselors provided to patient to discuss depression, anxiety and bulminia.  She will let me know if she requires a referral.  We also discussed weight the exacerbating factor for depression.  We will discuss further Wegovy at follow-up.  If Prozac not effective, we discussed trial of Effexor due to history of migraines.

## 2021-04-04 NOTE — Patient Instructions (Addendum)
Call insurance about baseline mammogram. If you would like to have ordered, please let me know.   You may stop Lexapro 20 mg.  you may start Prozac 20 mg.  Counselors that I would recommend Felecia Jan phone [phone]  We also have counselors in [facility] system including Bambi Cottle  and Elisha Ponder  Our hope is for gradual improvement of mood since starting medication; however this may take several weeks.   If you start to have unusual thoughts, thoughts of hurting yourself, or anyone else, please go immediately to the emergency department.   Follow up in 6 weeks.   Please text to [phone] and write the word 'home'. This will put you in touch with trained crisis counselor and resources.    National Suicide Prevention Hotline - available 24 hours a day, 7 days a week.  [phone]  Major Depressive Disorder Major depressive disorder is a mental illness. It also may be called clinical depression or unipolar depression. Major depressive disorder usually causes feelings of sadness, hopelessness, or helplessness. Some people with this disorder do not feel particularly sad but lose interest in doing things they used to enjoy (anhedonia). Major depressive disorder also can cause physical symptoms. It can interfere with work, school, relationships, and other normal everyday activities. The disorder varies in severity but is longer lasting and more serious than the sadness we all feel from time to time in our lives. Major depressive disorder often is triggered by stressful life events or major life changes. Examples of these triggers include divorce, loss of your job or home, a move, and the death of a family member or close friend. Sometimes this disorder occurs for no obvious reason at all. People who have family members with major depressive disorder or bipolar disorder are at higher risk for developing this disorder, with or without life stressors. Major depressive disorder can occur at any  age. It may occur just once in your life (single episode major depressive disorder). It may occur multiple times (recurrent major depressive disorder). SYMPTOMS People with major depressive disorder have either anhedonia or depressed mood on nearly a daily basis for at least 2 weeks or longer. Symptoms of depressed mood include: Feelings of sadness (blue or down in the dumps) or emptiness. Feelings of hopelessness or helplessness. Tearfulness or episodes of crying (may be observed by others). Irritability (children and adolescents). In addition to depressed mood or anhedonia or both, people with this disorder have at least four of the following symptoms: Difficulty sleeping or sleeping too much.   Significant change (increase or decrease) in appetite or weight.   Lack of energy or motivation. Feelings of guilt and worthlessness.   Difficulty concentrating, remembering, or making decisions. Unusually slow movement (psychomotor retardation) or restlessness (as observed by others).   Recurrent wishes for death, recurrent thoughts of self-harm (suicide), or a suicide attempt. People with major depressive disorder commonly have persistent negative thoughts about themselves, other people, and the world. People with severe major depressive disorder may experience distorted beliefs or perceptions about the world (psychotic delusions). They also may see or hear things that are not real (psychotic hallucinations). DIAGNOSIS Major depressive disorder is diagnosed through an assessment by your health care provider. Your health care provider will ask about aspects of your daily life, such as mood, sleep, and appetite, to see if you have the diagnostic symptoms of major depressive disorder. Your health care provider may ask about your medical history and use of alcohol or  drugs, including prescription medicines. Your health care provider also may do a physical exam and blood work. This is because certain medical  conditions and the use of certain substances can cause major depressive disorder-like symptoms (secondary depression). Your health care provider also may refer you to a mental health specialist for further evaluation and treatment. TREATMENT It is important to recognize the symptoms of major depressive disorder and seek treatment. The following treatments can be prescribed for this disorder:   Medicine. Antidepressant medicines usually are prescribed. Antidepressant medicines are thought to correct chemical imbalances in the brain that are commonly associated with major depressive disorder. Other types of medicine may be added if the symptoms do not respond to antidepressant medicines alone or if psychotic delusions or hallucinations occur. Talk therapy. Talk therapy can be helpful in treating major depressive disorder by providing support, education, and guidance. Certain types of talk therapy also can help with negative thinking (cognitive behavioral therapy) and with relationship issues that trigger this disorder (interpersonal therapy). A mental health specialist can help determine which treatment is best for you. Most people with major depressive disorder do well with a combination of medicine and talk therapy. Treatments involving electrical stimulation of the brain can be used in situations with extremely severe symptoms or when medicine and talk therapy do not work over time. These treatments include electroconvulsive therapy, transcranial magnetic stimulation, and vagal nerve stimulation.   This information is not intended to replace advice given to you by your health care provider. Make sure you discuss any questions you have with your health care provider.   Document Released: 11/04/2012 Document Revised: 07/31/2014 Document Reviewed: 11/04/2012 Elsevier Interactive Patient Education [employer]

## 2021-04-11 ENCOUNTER — Other Ambulatory Visit (INDEPENDENT_AMBULATORY_CARE_PROVIDER_SITE_OTHER): Payer: Medicaid Other

## 2021-04-11 ENCOUNTER — Other Ambulatory Visit: Payer: Self-pay

## 2021-04-11 DIAGNOSIS — F32A Depression, unspecified: Secondary | ICD-10-CM

## 2021-04-11 DIAGNOSIS — R5383 Other fatigue: Secondary | ICD-10-CM

## 2021-04-11 LAB — CBC WITH DIFFERENTIAL/PLATELET
Basophils Absolute: 0 10*3/uL (ref 0.0–0.1)
Basophils Relative: 0.4 % (ref 0.0–3.0)
Eosinophils Absolute: 0.2 10*3/uL (ref 0.0–0.7)
Eosinophils Relative: 2.2 % (ref 0.0–5.0)
HCT: 37.4 % (ref 36.0–46.0)
Hemoglobin: 11.9 g/dL — ABNORMAL LOW (ref 12.0–15.0)
Lymphocytes Relative: 27.3 % (ref 12.0–46.0)
Lymphs Abs: 2.1 10*3/uL (ref 0.7–4.0)
MCHC: 31.9 g/dL (ref 30.0–36.0)
MCV: 77.9 fl — ABNORMAL LOW (ref 78.0–100.0)
Monocytes Absolute: 0.6 10*3/uL (ref 0.1–1.0)
Monocytes Relative: 8 % (ref 3.0–12.0)
Neutro Abs: 4.8 10*3/uL (ref 1.4–7.7)
Neutrophils Relative %: 62.1 % (ref 43.0–77.0)
Platelets: 316 10*3/uL (ref 150.0–400.0)
RBC: 4.81 Mil/uL (ref 3.87–5.11)
RDW: 15.3 % (ref 11.5–15.5)
WBC: 7.7 10*3/uL (ref 4.0–10.5)

## 2021-04-11 LAB — LIPID PANEL
Cholesterol: 203 mg/dL — ABNORMAL HIGH (ref 0–200)
HDL: 52.4 mg/dL (ref >39.00)
LDL Cholesterol: 116 mg/dL — ABNORMAL HIGH (ref 0–99)
NonHDL: 150.44
Total CHOL/HDL Ratio: 4
Triglycerides: 170 mg/dL — ABNORMAL HIGH (ref 0.0–149.0)
VLDL: 34 mg/dL (ref 0.0–40.0)

## 2021-04-11 LAB — COMPREHENSIVE METABOLIC PANEL
ALT: 12 U/L (ref 0–35)
AST: 11 U/L (ref 0–37)
Albumin: 3.7 g/dL (ref 3.5–5.2)
Alkaline Phosphatase: 62 U/L (ref 39–117)
BUN: 10 mg/dL (ref 6–23)
CO2: 27 mEq/L (ref 19–32)
Calcium: 9.4 mg/dL (ref 8.4–10.5)
Chloride: 105 mEq/L (ref 96–112)
Creatinine, Ser: 0.8 mg/dL (ref 0.40–1.20)
GFR: 93.06 mL/min (ref >60.00)
Glucose, Bld: 84 mg/dL (ref 70–99)
Potassium: 4.4 mEq/L (ref 3.5–5.1)
Sodium: 140 mEq/L (ref 135–145)
Total Bilirubin: 0.4 mg/dL (ref 0.2–1.2)
Total Protein: 7 g/dL (ref 6.0–8.3)

## 2021-04-11 LAB — TSH: TSH: 2.22 u[IU]/mL (ref 0.35–5.50)

## 2021-04-11 LAB — B12 AND FOLATE PANEL
Folate: 7 ng/mL (ref >5.9)
Vitamin B-12: 187 pg/mL — ABNORMAL LOW (ref 211–911)

## 2021-04-11 LAB — HEMOGLOBIN A1C: Hgb A1c MFr Bld: 6.1 % (ref 4.6–6.5)

## 2021-04-11 LAB — VITAMIN D 25 HYDROXY (VIT D DEFICIENCY, FRACTURES): VITD: 32.71 ng/mL (ref 30.00–100.00)

## 2021-04-12 ENCOUNTER — Other Ambulatory Visit: Payer: Self-pay | Admitting: Family

## 2021-04-12 DIAGNOSIS — E538 Deficiency of other specified B group vitamins: Secondary | ICD-10-CM

## 2021-04-17 ENCOUNTER — Encounter: Payer: Self-pay | Admitting: Family

## 2021-04-19 ENCOUNTER — Other Ambulatory Visit: Payer: Self-pay | Admitting: Family

## 2021-04-19 DIAGNOSIS — E538 Deficiency of other specified B group vitamins: Secondary | ICD-10-CM

## 2021-04-19 MED ORDER — CYANOCOBALAMIN 1000 MCG/ML IJ SOLN: INTRAMUSCULAR | 15 refills | Status: DC

## 2021-04-22 ENCOUNTER — Other Ambulatory Visit (INDEPENDENT_AMBULATORY_CARE_PROVIDER_SITE_OTHER): Payer: Medicaid Other

## 2021-04-22 ENCOUNTER — Other Ambulatory Visit: Payer: Self-pay

## 2021-04-22 DIAGNOSIS — E538 Deficiency of other specified B group vitamins: Secondary | ICD-10-CM | POA: Diagnosis not present

## 2021-04-22 LAB — B12 AND FOLATE PANEL
Folate: 8.6 ng/mL (ref >5.9)
Vitamin B-12: 159 pg/mL — ABNORMAL LOW (ref 211–911)

## 2021-04-22 LAB — IBC + FERRITIN
Ferritin: 6.5 ng/mL — ABNORMAL LOW (ref 10.0–291.0)
Iron: 78 ug/dL (ref 42–145)
Saturation Ratios: 14 % — ABNORMAL LOW (ref 20.0–50.0)
TIBC: 557.2 ug/dL — ABNORMAL HIGH (ref 250.0–450.0)
Transferrin: 398 mg/dL — ABNORMAL HIGH (ref 212.0–360.0)

## 2021-04-26 LAB — CELIAC DISEASE AB SCREEN W/RFX
Antigliadin Abs, IgA: 9 units (ref 0–19)
IgA/Immunoglobulin A, Serum: 115 mg/dL (ref 87–352)
Transglutaminase IgA: <2 U/mL (ref 0–3)

## 2021-04-26 LAB — HOMOCYSTEINE: Homocysteine: 7.6 umol/L (ref <10.4)

## 2021-04-26 LAB — METHYLMALONIC ACID, SERUM: Methylmalonic Acid, Quant: 218 nmol/L (ref 87–318)

## 2021-04-26 LAB — ANTI-PARIETAL ANTIBODY: PARIETAL CELL AB SCREEN: NEGATIVE

## 2021-04-26 LAB — INTRINSIC FACTOR ANTIBODIES: Intrinsic Factor: NEGATIVE

## 2021-04-27 ENCOUNTER — Other Ambulatory Visit (INDEPENDENT_AMBULATORY_CARE_PROVIDER_SITE_OTHER): Payer: Medicaid Other

## 2021-04-27 DIAGNOSIS — E538 Deficiency of other specified B group vitamins: Secondary | ICD-10-CM | POA: Diagnosis not present

## 2021-04-27 LAB — FECAL OCCULT BLOOD, IMMUNOCHEMICAL: Fecal Occult Bld: NEGATIVE

## 2021-04-29 ENCOUNTER — Encounter: Payer: Self-pay | Admitting: Family

## 2021-04-29 ENCOUNTER — Telehealth: Payer: Self-pay

## 2021-04-29 DIAGNOSIS — E538 Deficiency of other specified B group vitamins: Secondary | ICD-10-CM | POA: Insufficient documentation

## 2021-04-29 NOTE — Telephone Encounter (Signed)
LMTCB to make sure she picked up the stool cards from up front.

## 2021-05-16 ENCOUNTER — Ambulatory Visit (INDEPENDENT_AMBULATORY_CARE_PROVIDER_SITE_OTHER): Payer: Medicaid Other | Admitting: Family

## 2021-05-16 ENCOUNTER — Encounter: Payer: Self-pay | Admitting: Family

## 2021-05-16 ENCOUNTER — Other Ambulatory Visit: Payer: Self-pay | Admitting: Family

## 2021-05-16 ENCOUNTER — Other Ambulatory Visit: Payer: Self-pay

## 2021-05-16 VITALS — BP 106/70 | HR 83 | Temp 98.2°F | Ht 63.0 in | Wt 248.8 lb

## 2021-05-16 DIAGNOSIS — F419 Anxiety disorder, unspecified: Secondary | ICD-10-CM | POA: Diagnosis not present

## 2021-05-16 DIAGNOSIS — E669 Obesity, unspecified: Secondary | ICD-10-CM

## 2021-05-16 DIAGNOSIS — R21 Rash and other nonspecific skin eruption: Secondary | ICD-10-CM | POA: Diagnosis not present

## 2021-05-16 DIAGNOSIS — E538 Deficiency of other specified B group vitamins: Secondary | ICD-10-CM | POA: Diagnosis not present

## 2021-05-16 DIAGNOSIS — R5383 Other fatigue: Secondary | ICD-10-CM | POA: Diagnosis not present

## 2021-05-16 DIAGNOSIS — F32A Depression, unspecified: Secondary | ICD-10-CM | POA: Diagnosis not present

## 2021-05-16 MED ORDER — CLOTRIMAZOLE 1 % EX OINT: 1.0000 "application " | TOPICAL_OINTMENT | Freq: Two times a day (BID) | CUTANEOUS | 2 refills | Status: DC

## 2021-05-16 MED ORDER — TIRZEPATIDE 2.5 MG/0.5ML ~~LOC~~ SOAJ: 2.5000 mg | SUBCUTANEOUS | 3 refills | Status: DC

## 2021-05-16 NOTE — Progress Notes (Signed)
Subjective:    Patient ID: Sue Hernandez, female    DOB: 23-May-1981, 40 y.o.   MRN: [id number]  CC: Sue Hernandez is a 40 y.o. female who presents today for follow up.   HPI: Follow-up depression, B12 deficiency  Stopped Lexapro and started Prozac last visit. Sue Hernandez self increased from 20mg  to 40mg  2 weeks ago with improvement of symptoms 2 weeks ago. Crying and low feeling improved. Sleeping well overall.  Anxiety improved.  Fatigue has hasnt changed, however not worse.  Counseling appointment 05/25/21.   B12 deficiency-negative celiac, pernicious anemia.  Sue Hernandez is administering B12 injections at home.  Iron deficient Anemia-started ferrous sulfate TID. No constipation.  stool occult card negative. Menses are regular and not heavy   Migraines are improved and feel similar to HA to in the past.   Sue Hernandez is concerned about weight gain and would like to discuss medication.  No personal or family history of medullary thyroid cancer, or MEN.  No trouble or pain with swallowing Sue Hernandez is prediabetic  Sue Hernandez gets rash under breasts and now has noticed flexure of left elbow.  Itchy. No purulence.   Sleep study negative in the past.  Sleep is not restorative.  Sue Hernandez doesn't snore.   HISTORY:  Past Medical History:  Diagnosis Date   COVID-19    fall 2021   Depression    Dr. 13/2/22   Eating disorder    bulemia   Migraines    Past Surgical History:  Procedure Laterality Date   CESAREAN SECTION N/A 10/07/2016   Procedure: CESAREAN SECTION;  Surgeon: Ardelle Anton, MD;  Location: [facility] ORS;  Service: Obstetrics;  Laterality: N/A;   CESAREAN SECTION N/A 02/24/2020   Procedure: CESAREAN SECTION;  Surgeon: Christeen Douglas, 04/25/2020, MD;  Location: [facility] OR[facility];  Service: Obstetrics;  Laterality: N/A;   CHOLECYSTECTOMY  2009   LAPAROSCOPIC GASTRIC BANDING  2009   Dr. 2010   LAPAROSCOPIC GASTRIC SLEEVE RESECTION  01/2013   TONSILLECTOMY  2006   UPPER GI ENDOSCOPY  2014   hiatial hernia, Dr. 02/2013    Family History  Problem Relation Age of Onset   High Cholesterol Mother    Cancer Maternal Grandmother        lung   Cancer Maternal Grandfather        lung   Cancer Paternal Grandmother        Breast   Heart disease Paternal Grandfather    Cancer Paternal Grandfather        renal cancer went to his lungs   Colon cancer Neg Hx    Thyroid cancer Neg Hx     Allergies: Ibuprofen and Derma guard [protective adhesive powder] Current Outpatient Medications on File Prior to Visit  Medication Sig Dispense Refill   cyanocobalamin (,VITAMIN B-12,) 1000 MCG/ML injection 1000 mcg (1 mL) intramuscular injection in the thigh ( vastus lateralis) once per week for four weeks, followed by 1000 mcg IM injection once per month. 1 mL 15   etonogestrel-ethinyl estradiol (NUVARING) 0.12-0.015 MG/24HR vaginal ring Place vaginally.     Ferrous Sulfate (IRON) 325 (65 Fe) MG TABS Take by mouth daily at 12 noon.     FLUoxetine (PROZAC) 20 MG capsule Take 1 capsule (20 mg total) by mouth every morning. 90 capsule 3   fluticasone (FLONASE) 50 MCG/ACT nasal spray Place 2 sprays into both nostrils daily as needed for allergies or rhinitis. 16 g 2   propranolol (INDERAL) 20 MG tablet Take 20 mg by mouth  every morning.     No current facility-administered medications on file prior to visit.    Social History   Tobacco Use   Smoking status: Never   Smokeless tobacco: Never  Substance Use Topics   Alcohol use: No    Comment: Maybe once a month   Drug use: No    Review of Systems  Constitutional:  Positive for fatigue. Negative for chills and fever.  HENT:  Negative for trouble swallowing.   Respiratory:  Negative for cough.   Cardiovascular:  Negative for chest pain and palpitations.  Gastrointestinal:  Negative for nausea and vomiting.     Objective:    BP 106/70 (BP Location: Right Arm, Patient Position: Sitting, Cuff Size: Large)   Pulse 83   Temp 98.2 F (36.8 C) (Oral)   Ht 5\' 3"  (1.6 m)    Wt 248 lb 12.8 oz (112.9 kg)   LMP 05/10/2021 (Exact Date)   SpO2 98%   BMI 44.07 kg/m  BP Readings from Last 3 Encounters:  05/16/21 106/70  04/04/21 98/76  03/09/20 100/70   Wt Readings from Last 3 Encounters:  05/16/21 248 lb 12.8 oz (112.9 kg)  04/04/21 247 lb 3.2 oz (112.1 kg)  12/23/20 245 lb (111.1 kg)    Physical Exam Vitals reviewed.  Constitutional:      Appearance: Sue Hernandez is well-developed.  Eyes:     Conjunctiva/sclera: Conjunctivae normal.  Neck:     Thyroid: No thyroid mass, thyromegaly or thyroid tenderness.  Cardiovascular:     Rate and Rhythm: Normal rate and regular rhythm.     Pulses: Normal pulses.     Heart sounds: Normal heart sounds.  Pulmonary:     Effort: Pulmonary effort is normal.     Breath sounds: Normal breath sounds. No wheezing, rhonchi or rales.  Skin:    General: Skin is warm and dry.  Neurological:     Mental Status: Sue Hernandez is alert.  Psychiatric:        Speech: Speech normal.        Behavior: Behavior normal.        Thought Content: Thought content normal.       Assessment & Plan:   Problem List Items Addressed This Visit       Other   Anxiety and depression    Improved on Prozac 40 mg.  We will continue.  Sue Hernandez will start counseling next week.  Close follow-up in 6 weeks      B12 deficiency    Continue B12 injections at home.  We will recheck B12 approximately 6 weeks      Fatigue    Uncontrolled. Consider OSA evaluation at follow up. Will repeat cbc, b12, ferritin in 6 weeks time.       Obesity (BMI 30-39.9) - Primary    Uncontrolled. We discussed starting on\ mounjaro.  Counseled on black box warning as it relates to medullary thyroid cancer, MEN.  Counseled on side effects nausea, abdominal distention and how to dose medication.  Sue Hernandez will let me know of any concerns,questions      Relevant Medications   tirzepatide (MOUNJARO) 2.5 MG/0.5ML Pen   Other Visit Diagnoses     Rash       Relevant Medications    Clotrimazole 1 % OINT        I am having Sue Hernandez start on tirzepatide and Clotrimazole. I am also having Sue Hernandez maintain Sue Hernandez propranolol, fluticasone, etonogestrel-ethinyl estradiol, FLUoxetine, cyanocobalamin, and Iron.   Meds  ordered this encounter  Medications   tirzepatide (MOUNJARO) 2.5 MG/0.5ML Pen    Sig: Inject 2.5 mg into the skin once a week.    Dispense:  2 mL    Refill:  3    Order Specific Question:   Supervising Provider    Answer:   Duncan Dull L [[id number]]   Clotrimazole 1 % OINT    Sig: Apply 1 application topically 2 (two) times daily.    Dispense:  56.7 g    Refill:  2    Order Specific Question:   Supervising Provider    Answer:   Sherlene Shams [[id number]]    Return precautions given.   Risks, benefits, and alternatives of the medications and treatment plan prescribed today were discussed, and patient expressed understanding.   Education regarding symptom management and diagnosis given to patient on AVS.  Continue to follow with Allegra Grana, FNP for routine health maintenance.   Sue Hernandez and I agreed with plan.   Rennie Plowman, FNP

## 2021-05-16 NOTE — Patient Instructions (Signed)
Please call your insurance and let me know if I can order baseline screening mammogram.  We will do this this year and then start annually when you are 41.  Start Mounjaro 2.5mg  once per week injected subcutaneously ( [place])  in stomach. Please clean with alcohol swab prior to injection and be sure to rotate site. You may schedule a nurse visit if you would like to first injection.   After 4 weeks, and if tolerated and weight loss has not reached 1-2 lbs per week, please increase to 5mg  once per week [place].    Please read information on medication below and remember black box warning that you may not take if you or a family member is diagnosed with thyroid cancer (medullary thyroid cancer), or multiple endocrine neoplasia.       Tirzepatide Injection ) What is this medication? TIRZEPATIDE (tir ZEP a tide) treats type 2 diabetes. It works by increasing insulin levels in your body, which decreases your blood sugar (glucose). Changes to diet and exercise are often combined with this medication. This medicine may be used for other purposes; ask your health care provider or pharmacist if you have questions. COMMON BRAND NAME(S): MOUNJARO What should I tell my care team before I take this medication? They need to know if you have any of these conditions: Endocrine tumors (MEN 2) or if someone in your family had these tumors Eye disease, vision problems Gallbladder disease History of pancreatitis Kidney disease Stomach or intestine problems Thyroid cancer or if someone in your family had thyroid cancer An unusual or allergic reaction to tirzepatide, other medications, foods, dyes, or preservatives Pregnant or trying to get pregnant Breast-feeding How should I use this medication? This medication is injected under the skin. You will be taught how to prepare and give it. It is given once every week (every 7 days). Keep taking it unless your health care provider tells you to stop. If you use  this medication with insulin, you should inject this medication and the insulin separately. Do not mix them together. Do not give the injections right next to each other. Change (rotate) injection sites with each injection. This medication comes with INSTRUCTIONS FOR USE. Ask your pharmacist for directions on how to use this medication. Read the information carefully. Talk to your pharmacist or care team if you have questions. It is important that you put your used needles and syringes in a special sharps container. Do not put them in a trash can. If you do not have a sharps container, call your pharmacist or care team to get one. A special MedGuide will be given to you by the pharmacist with each prescription and refill. Be sure to read this information carefully each time. Talk to your care team about the use of this medication in children. Special care may be needed. Overdosage: If you think you have taken too much of this medicine contact a poison control center or emergency room at once. NOTE: This medicine is only for you. Do not share this medicine with others. What if I miss a dose? If you miss a dose, take it as soon as you can unless it is more than 4 days (96 hours) late. If it is more than 4 days late, skip the missed dose. Take the next dose at the normal time. Do not take 2 doses within 3 days of each other. What may interact with this medication? Alcohol containing beverages Antiviral medications for HIV or AIDS Aspirin and  aspirin-like medications Beta-blockers like atenolol, metoprolol, propranolol Certain medications for blood pressure, heart disease, irregular heart beat Chromium Clonidine Diuretics Female hormones, such as estrogens or progestins, birth control pills Fenofibrate Gemfibrozil Guanethidine Isoniazid Lanreotide Female hormones or anabolic steroids MAOIs like Carbex, Eldepryl, Marplan, Nardil, and Parnate Medications for weight loss Medications for allergies,  asthma, cold, or cough Medications for depression, anxiety, or psychotic disturbances Niacin Nicotine NSAIDs, medications for pain and inflammation, like ibuprofen or naproxen Octreotide Other medications for diabetes, like glyburide, glipizide, or glimepiride Pasireotide Pentamidine Phenytoin Probenecid Quinolone antibiotics such as ciprofloxacin, levofloxacin, ofloxacin Reserpine Some herbal dietary supplements Steroid medications such as prednisone or cortisone Sulfamethoxazole; trimethoprim Thyroid hormones Warfarin This list may not describe all possible interactions. Give your health care provider a list of all the medicines, herbs, non-prescription drugs, or dietary supplements you use. Also tell them if you smoke, drink alcohol, or use illegal drugs. Some items may interact with your medicine. What should I watch for while using this medication? Visit your care team for regular checks on your progress. Drink plenty of fluids while taking this medication. Check with your care team if you get an attack of severe diarrhea, nausea, and vomiting. The loss of too much body fluid can make it dangerous for you to take this medication. A test called the HbA1C (A1C) will be monitored. This is a simple blood test. It measures your blood sugar control over the last 2 to 3 months. You will receive this test every 3 to 6 months. Learn how to check your blood sugar. Learn the symptoms of low and high blood sugar and how to manage them. Always carry a quick-source of sugar with you in case you have symptoms of low blood sugar. Examples include hard sugar candy or glucose tablets. Make sure others know that you can choke if you eat or drink when you develop serious symptoms of low blood sugar, such as seizures or unconsciousness. They must get medical help at once. Tell your care team if you have high blood sugar. You might need to change the dose of your medication. If you are sick or exercising  more than usual, you might need to change the dose of your medication. Do not skip meals. Ask your care team if you should avoid alcohol. Many nonprescription cough and cold products contain sugar or alcohol. These can affect blood sugar. Pens should never be shared. Even if the needle is changed, sharing may result in passing of viruses like hepatitis or HIV. Wear a medical ID bracelet or chain, and carry a card that describes your disease and details of your medication and dosage times. Birth control may not work properly while you are taking this medication. If you take birth control pills by mouth, your care team may recommend another type of birth control for 4 weeks after you start this medication and for 4 weeks after each increase in your dose of this medication. Ask your care team which birth control methods you should use. What side effects may I notice from receiving this medication? Side effects that you should report to your care team as soon as possible: Allergic reactions-skin rash, itching, hives, swelling of the face, lips, tongue, or throat Change in vision Dehydration-increased thirst, dry mouth, feeling faint or lightheaded, headache, dark yellow or brown urine Gallbladder problems-severe stomach pain, nausea, vomiting, fever Kidney injury-decrease in the amount of urine, swelling of the ankles, hands, or feet Pancreatitis-severe stomach pain that spreads to your back or  gets worse after eating or when touched, fever, nausea, vomiting Thyroid cancer-new mass or lump in the neck, pain or trouble swallowing, trouble breathing, hoarseness Side effects that usually do not require medical attention (report these to your care team if they continue or are bothersome): Constipation Diarrhea Loss of Appetite Nausea Stomach pain Upset stomach Vomiting This list may not describe all possible side effects. Call your doctor for medical advice about side effects. You may report side  effects to FDA at 1-800-FDA-1088. Where should I keep my medication? Keep out of the reach of children and pets. Refrigeration (preferred): Store unopened pens in a refrigerator between 2 and 8 degrees C (36 and 46 degrees F). Keep it in the original carton until you are ready to take it. Do not freeze or use if the medication has been frozen. Protect from light. Get rid of any unused medication after the expiration date on the label. Room Temperature: The pen may be stored at room temperature below 30 degrees C (86 degrees F) for up to a total of 21 days if needed. Protect from light. Avoid exposure to extreme heat. If it is stored at room temperature, throw away any unused medication after 21 days or after it expires, whichever is first. The pen has glass parts. Handle it carefully. If you drop the pen on a hard surface, do not use it. Use a new pen for your injection. To get rid of medications that are no longer needed or have expired: Take the medication to a medication take-back program. Check with your pharmacy or law enforcement to find a location. If you cannot return the medication, ask your pharmacist or care team how to get rid of this medication safely. NOTE: This sheet is a summary. It may not cover all possible information. If you have questions about this medicine, talk to your doctor, pharmacist, or health care provider.  2022 Elsevier/Gold Standard (2020-12-07 13:57:48)

## 2021-05-16 NOTE — Assessment & Plan Note (Addendum)
Uncontrolled. Consider OSA evaluation at follow up. Will repeat cbc, b12, ferritin in 6 weeks time.

## 2021-05-16 NOTE — Assessment & Plan Note (Addendum)
Uncontrolled. We discussed starting on\ mounjaro.  Counseled on black box warning as it relates to medullary thyroid cancer, MEN.  Counseled on side effects nausea, abdominal distention and how to dose medication.  She will let me know of any concerns,questions

## 2021-05-16 NOTE — Assessment & Plan Note (Signed)
Continue B12 injections at home.  We will recheck B12 approximately 6 weeks

## 2021-05-16 NOTE — Assessment & Plan Note (Signed)
Improved on Prozac 40 mg.  We will continue.  She will start counseling next week.  Close follow-up in 6 weeks

## 2021-05-17 ENCOUNTER — Other Ambulatory Visit: Payer: Self-pay

## 2021-05-17 MED ORDER — FLUOXETINE HCL 40 MG PO CAPS: 40.0000 mg | ORAL_CAPSULE | Freq: Every day | ORAL | 3 refills | Status: DC

## 2021-05-18 ENCOUNTER — Other Ambulatory Visit: Payer: Self-pay | Admitting: Family

## 2021-05-18 DIAGNOSIS — E669 Obesity, unspecified: Secondary | ICD-10-CM

## 2021-05-18 MED ORDER — OZEMPIC (0.25 OR 0.5 MG/DOSE) 2 MG/1.5ML ~~LOC~~ SOPN: 0.2500 mg | PEN_INJECTOR | SUBCUTANEOUS | 3 refills | Status: DC

## 2021-06-12 ENCOUNTER — Encounter: Payer: Self-pay | Admitting: Family

## 2021-06-13 ENCOUNTER — Other Ambulatory Visit: Payer: Self-pay | Admitting: Family

## 2021-06-13 DIAGNOSIS — E669 Obesity, unspecified: Secondary | ICD-10-CM

## 2021-06-13 MED ORDER — TIRZEPATIDE 5 MG/0.5ML ~~LOC~~ SOAJ: 5.0000 mg | SUBCUTANEOUS | 2 refills | Status: DC

## 2021-06-27 ENCOUNTER — Other Ambulatory Visit: Payer: Self-pay

## 2021-06-27 ENCOUNTER — Ambulatory Visit (INDEPENDENT_AMBULATORY_CARE_PROVIDER_SITE_OTHER): Payer: Medicaid Other | Admitting: Family

## 2021-06-27 DIAGNOSIS — F419 Anxiety disorder, unspecified: Secondary | ICD-10-CM | POA: Diagnosis not present

## 2021-06-27 DIAGNOSIS — J40 Bronchitis, not specified as acute or chronic: Secondary | ICD-10-CM

## 2021-06-27 DIAGNOSIS — E669 Obesity, unspecified: Secondary | ICD-10-CM | POA: Diagnosis not present

## 2021-06-27 DIAGNOSIS — R5383 Other fatigue: Secondary | ICD-10-CM

## 2021-06-27 DIAGNOSIS — F32A Depression, unspecified: Secondary | ICD-10-CM | POA: Diagnosis not present

## 2021-06-27 MED ORDER — BENZONATATE 100 MG PO CAPS: 100.0000 mg | ORAL_CAPSULE | Freq: Three times a day (TID) | ORAL | 1 refills | Status: DC | PRN

## 2021-06-27 MED ORDER — FLUCONAZOLE 150 MG PO TABS: 150.0000 mg | ORAL_TABLET | Freq: Once | ORAL | 1 refills | Status: AC

## 2021-06-27 MED ORDER — AZITHROMYCIN 250 MG PO TABS: ORAL_TABLET | ORAL | 0 refills | Status: AC

## 2021-06-27 NOTE — Assessment & Plan Note (Addendum)
Unchanged, stable. Declines OSA evaluation.  Pending iron stores, B12 to ensure within normal range.  Patient prefers to watch symptom.  We did discuss referral to hematology/oncology if symptom remains persistent, certainly worsens.  Patient verbalized understanding

## 2021-06-27 NOTE — Assessment & Plan Note (Signed)
Improved.  Continue mounjaro 5 mg

## 2021-06-27 NOTE — Assessment & Plan Note (Signed)
Improved.  Continue Prozac 40 mg as well as counseling.  will follow closely

## 2021-06-27 NOTE — Progress Notes (Signed)
Subjective:    Patient ID: Sue Hernandez, female    DOB: [birth year], 40 y.o.   MRN: [id number]  CC: Sue Hernandez is a 40 y.o. female who presents today for follow up.   HPI: Complains of productive cough this past week x one week, unchanged. Endorses sore throat, sinus pain, fatigue. Dayquil and mucinex with some relief.  No  fever, sob Family had influenza last week and exhausting week with children sick.   She gets yeast infection with antibiotic.   Fatigue is unchanged. Sleep is restorative on occasion.   Depression-improved and she is compliant with Prozac 40mg . She has started counseling which is helpful for her. No si/hi  Obesity -she has mild nausea however is overall very pleased with mounjaro 5mg . Cravings to eat are reduced.     HISTORY:  Past Medical History:  Diagnosis Date   COVID-19    fall 2021   Depression    Dr.   Eating disorder    bulemia   Migraines    Past Surgical History:  Procedure Laterality Date   CESAREAN SECTION N/A 10/07/2016   Procedure: CESAREAN SECTION;  Surgeon: Ardelle Anton, MD;  Location: [facility];  Service: Obstetrics;  Laterality: N/A;   CESAREAN SECTION N/A 02/24/2020   Procedure: CESAREAN SECTION;  Surgeon: Christeen Douglas, 04/25/2020, MD;  Location: [facility];  Service: Obstetrics;  Laterality: N/A;   CHOLECYSTECTOMY  2009   LAPAROSCOPIC GASTRIC BANDING  2009   Dr. 2010   LAPAROSCOPIC GASTRIC SLEEVE RESECTION  01/2013   TONSILLECTOMY  2006   UPPER GI ENDOSCOPY  2014   hiatial hernia, Dr. 02/2013   Family History  Problem Relation Age of Onset   High Cholesterol Mother    Cancer Maternal Grandmother        lung   Cancer Maternal Grandfather        lung   Cancer Paternal Grandmother        Breast   Heart disease Paternal Grandfather    Cancer Paternal Grandfather        renal cancer went to his lungs   Colon cancer Neg Hx    Thyroid cancer Neg Hx     Allergies: Ibuprofen and Derma guard [protective adhesive  powder] Current Outpatient Medications on File Prior to Visit  Medication Sig Dispense Refill   cyanocobalamin (,VITAMIN B-12,) 1000 MCG/ML injection 1000 mcg (1 mL) intramuscular injection in the thigh ( vastus lateralis) once per week for four weeks, followed by 1000 mcg IM injection once per month. 1 mL 15   etonogestrel-ethinyl estradiol (NUVARING) 0.12-0.015 MG/24HR vaginal ring Place vaginally.     Ferrous Sulfate (IRON) 325 (65 Fe) MG TABS Take by mouth daily at 12 noon.     FLUoxetine (PROZAC) 40 MG capsule Take 1 capsule (40 mg total) by mouth daily. 90 capsule 3   fluticasone (FLONASE) 50 MCG/ACT nasal spray Place 2 sprays into both nostrils daily as needed for allergies or rhinitis. 16 g 2   MOUNJARO 5 MG/0.5ML Pen INJECT 5 MG INTO THE SKIN ONCE A WEEK. 2 mL 2   propranolol ER (INDERAL LA) 60 MG 24 hr capsule Take 60 mg by mouth daily.     No current facility-administered medications on file prior to visit.    Social History   Tobacco Use   Smoking status: Never   Smokeless tobacco: Never  Substance Use Topics   Alcohol use: No    Comment: Maybe once a month  Drug use: No    Review of Systems  Constitutional:  Positive for fatigue (unchanged). Negative for chills and fever.  HENT:  Positive for congestion and sinus pain. Negative for ear pain, postnasal drip and rhinorrhea.   Respiratory:  Positive for cough. Negative for shortness of breath.   Cardiovascular:  Negative for chest pain and palpitations.  Gastrointestinal:  Negative for nausea and vomiting.     Objective:    BP 100/80   Pulse 71   Temp (!) 96 F (35.6 C) (Temporal)   Ht 5\' 3"  (1.6 m)   Wt 234 lb 8 oz (106.4 kg)   LMP 06/04/2021 (Exact Date)   SpO2 98%   Breastfeeding No   BMI 41.54 kg/m  BP Readings from Last 3 Encounters:  06/27/21 100/80  05/16/21 106/70  04/04/21 98/76   Wt Readings from Last 3 Encounters:  06/27/21 234 lb 8 oz (106.4 kg)  05/16/21 248 lb 12.8 oz (112.9 kg)  04/04/21  247 lb 3.2 oz (112.1 kg)    Physical Exam Vitals reviewed.  Constitutional:      Appearance: She is well-developed.  HENT:     Head: Normocephalic and atraumatic.     Right Ear: Hearing, tympanic membrane, ear canal and external ear normal. No decreased hearing noted. No drainage, swelling or tenderness. No middle ear effusion. No foreign body. Tympanic membrane is not erythematous or bulging.     Left Ear: Hearing, tympanic membrane, ear canal and external ear normal. No decreased hearing noted. No drainage, swelling or tenderness.  No middle ear effusion. No foreign body. Tympanic membrane is not erythematous or bulging.     Nose: Nose normal. No rhinorrhea.     Right Sinus: No maxillary sinus tenderness or frontal sinus tenderness.     Left Sinus: No maxillary sinus tenderness or frontal sinus tenderness.     Mouth/Throat:     Pharynx: Uvula midline. No oropharyngeal exudate or posterior oropharyngeal erythema.     Tonsils: No tonsillar abscesses.  Eyes:     Conjunctiva/sclera: Conjunctivae normal.  Cardiovascular:     Rate and Rhythm: Regular rhythm.     Pulses: Normal pulses.     Heart sounds: Normal heart sounds.  Pulmonary:     Effort: Pulmonary effort is normal.     Breath sounds: Normal breath sounds. No wheezing, rhonchi or rales.  Lymphadenopathy:     Head:     Right side of head: No submental, submandibular, tonsillar, preauricular, posterior auricular or occipital adenopathy.     Left side of head: No submental, submandibular, tonsillar, preauricular, posterior auricular or occipital adenopathy.     Cervical: No cervical adenopathy.  Skin:    General: Skin is warm and dry.  Neurological:     Mental Status: She is alert.  Psychiatric:        Speech: Speech normal.        Behavior: Behavior normal.        Thought Content: Thought content normal.       Assessment & Plan:   Problem List Items Addressed This Visit       Respiratory   Bronchitis    Afebrile. No  acute respiratory distress.  Duration 7 days.  Patient declines testing for COVID, RSV or flu was outside the window for antiviral.  Discussed concern for bacterial infection based on duration of symptoms.  Start azithromycin, Tessalon Perles.  She will let me know how she is doing.  She will fill Diflucan if needed  for yeast infection.       Relevant Medications   azithromycin (ZITHROMAX) 250 MG tablet   benzonatate (TESSALON) 100 MG capsule   fluconazole (DIFLUCAN) 150 MG tablet     Other   Anxiety and depression    Improved.  Continue Prozac 40 mg as well as counseling.  will follow closely      Fatigue    Unchanged, stable. Declines OSA evaluation.  Pending iron stores, B12 to ensure within normal range.  Patient prefers to watch symptom.  We did discuss referral to hematology/oncology if symptom remains persistent, certainly worsens.  Patient verbalized understanding      Relevant Orders   B12 and Folate Panel   IBC + Ferritin   CBC with Differential/Platelet   Obesity (BMI 30-39.9)    Improved.  Continue mounjaro 5 mg        I have discontinued Kemiah K. Favela's propranolol and Clotrimazole. I am also having her start on azithromycin, benzonatate, and fluconazole. Additionally, I am having her maintain her fluticasone, etonogestrel-ethinyl estradiol, cyanocobalamin, Iron, FLUoxetine, Mounjaro, and propranolol ER.   Meds ordered this encounter  Medications   azithromycin (ZITHROMAX) 250 MG tablet    Sig: Take 2 tablets on day 1, then 1 tablet daily on days 2 through 5    Dispense:  6 tablet    Refill:  0    Order Specific Question:   Supervising Provider    Answer:   Sherlene Shams [[id number]]   benzonatate (TESSALON) 100 MG capsule    Sig: Take 1 capsule (100 mg total) by mouth 3 (three) times daily as needed for cough.    Dispense:  20 capsule    Refill:  1    Order Specific Question:   Supervising Provider    Answer:   Duncan Dull L [[id number]]   fluconazole  (DIFLUCAN) 150 MG tablet    Sig: Take 1 tablet (150 mg total) by mouth once for 1 dose. Take one tablet PO once. If sxs persist, may take one tablet PO 3 days later.    Dispense:  2 tablet    Refill:  1    Order Specific Question:   Supervising Provider    Answer:   Sherlene Shams [[id number]]     Return precautions given.   Risks, benefits, and alternatives of the medications and treatment plan prescribed today were discussed, and patient expressed understanding.   Education regarding symptom management and diagnosis given to patient on AVS.  Continue to follow with Allegra Grana, FNP for routine health maintenance.   Sue Hernandez and I agreed with plan.   Rennie Plowman, FNP

## 2021-06-27 NOTE — Assessment & Plan Note (Signed)
Afebrile. No acute respiratory distress.  Duration 7 days.  Patient declines testing for COVID, RSV or flu was outside the window for antiviral.  Discussed concern for bacterial infection based on duration of symptoms.  Start azithromycin, Tessalon Perles.  She will let me know how she is doing.  She will fill Diflucan if needed for yeast infection.

## 2021-06-27 NOTE — Patient Instructions (Signed)
Start zpak Ensure to take probiotics while on antibiotics and also for 2 weeks after completion. This can either be by eating yogurt daily or taking a probiotic supplement over the counter such as Culturelle.It is important to re-colonize the gut with good bacteria and also to prevent any diarrheal infections associated with antibiotic use.   Start diflucan if needed  Let me know how you are doing

## 2021-07-13 NOTE — Telephone Encounter (Signed)
PA done waiting on results.

## 2021-07-14 ENCOUNTER — Other Ambulatory Visit: Payer: Self-pay | Admitting: Family

## 2021-07-14 DIAGNOSIS — E538 Deficiency of other specified B group vitamins: Secondary | ICD-10-CM

## 2021-08-05 ENCOUNTER — Encounter: Payer: Self-pay | Admitting: Family

## 2021-08-11 ENCOUNTER — Other Ambulatory Visit: Payer: Self-pay

## 2021-08-11 MED ORDER — TIRZEPATIDE 7.5 MG/0.5ML ~~LOC~~ SOAJ: 7.5000 mg | SUBCUTANEOUS | 1 refills | Status: DC

## 2021-08-18 ENCOUNTER — Telehealth: Payer: Self-pay | Admitting: Family

## 2021-08-18 DIAGNOSIS — D649 Anemia, unspecified: Secondary | ICD-10-CM

## 2021-08-18 NOTE — Telephone Encounter (Signed)
Patient has a lab appt 08/25/2021, there are no orders in. °

## 2021-08-19 NOTE — Addendum Note (Signed)
Addended by: Allegra Grana on: 08/19/2021 10:18 AM   Modules accepted: Orders

## 2021-08-25 ENCOUNTER — Other Ambulatory Visit: Payer: Self-pay

## 2021-08-25 ENCOUNTER — Other Ambulatory Visit (INDEPENDENT_AMBULATORY_CARE_PROVIDER_SITE_OTHER): Payer: Medicaid Other

## 2021-08-25 DIAGNOSIS — D649 Anemia, unspecified: Secondary | ICD-10-CM | POA: Diagnosis not present

## 2021-08-25 LAB — CBC WITH DIFFERENTIAL/PLATELET
Basophils Absolute: 0 10*3/uL (ref 0.0–0.1)
Basophils Relative: 0.3 % (ref 0.0–3.0)
Eosinophils Absolute: 0.1 10*3/uL (ref 0.0–0.7)
Eosinophils Relative: 1.3 % (ref 0.0–5.0)
HCT: 41.2 % (ref 36.0–46.0)
Hemoglobin: 13.4 g/dL (ref 12.0–15.0)
Lymphocytes Relative: 23.7 % (ref 12.0–46.0)
Lymphs Abs: 1.7 10*3/uL (ref 0.7–4.0)
MCHC: 32.4 g/dL (ref 30.0–36.0)
MCV: 81.4 fl (ref 78.0–100.0)
Monocytes Absolute: 0.5 10*3/uL (ref 0.1–1.0)
Monocytes Relative: 7.2 % (ref 3.0–12.0)
Neutro Abs: 5 10*3/uL (ref 1.4–7.7)
Neutrophils Relative %: 67.5 % (ref 43.0–77.0)
Platelets: 326 10*3/uL (ref 150.0–400.0)
RBC: 5.06 Mil/uL (ref 3.87–5.11)
RDW: 14.6 % (ref 11.5–15.5)
WBC: 7.4 10*3/uL (ref 4.0–10.5)

## 2021-08-25 LAB — IBC + FERRITIN
Ferritin: 34.9 ng/mL (ref 10.0–291.0)
Iron: 106 ug/dL (ref 42–145)
Saturation Ratios: 20.8 % (ref 20.0–50.0)
TIBC: 509.6 ug/dL — ABNORMAL HIGH (ref 250.0–450.0)
Transferrin: 364 mg/dL — ABNORMAL HIGH (ref 212.0–360.0)

## 2021-08-31 ENCOUNTER — Ambulatory Visit (INDEPENDENT_AMBULATORY_CARE_PROVIDER_SITE_OTHER): Payer: Medicaid Other | Admitting: Family

## 2021-08-31 ENCOUNTER — Other Ambulatory Visit: Payer: Self-pay

## 2021-08-31 ENCOUNTER — Encounter: Payer: Self-pay | Admitting: Family

## 2021-08-31 DIAGNOSIS — F32A Depression, unspecified: Secondary | ICD-10-CM

## 2021-08-31 DIAGNOSIS — F419 Anxiety disorder, unspecified: Secondary | ICD-10-CM

## 2021-08-31 DIAGNOSIS — E669 Obesity, unspecified: Secondary | ICD-10-CM

## 2021-08-31 NOTE — Progress Notes (Signed)
Subjective:    Patient ID: Sue Hernandez, female    DOB: [birth year], 41 y.o.   MRN: [id number]  CC: Sue Hernandez is a 41 y.o. female who presents today for follow up.   HPI: Feels well today.  No new complaints  Compliant with mounjaro 5 and yet to increase to 7.5mg . She has lost 30 lbs and has felt well on mounjaro. No constipation, nausea.   Anxiety  and depression- compliant with prozac 40mg .  Feels well on this dose.   HISTORY:  Past Medical History:  Diagnosis Date   COVID-19    fall 2021   Depression    Dr. 2022   Eating disorder    bulemia   Migraines    Past Surgical History:  Procedure Laterality Date   CESAREAN SECTION N/A 10/07/2016   Procedure: CESAREAN SECTION;  Surgeon: 10/09/2016, MD;  Location: [facility];  Service: Obstetrics;  Laterality: N/A;   CESAREAN SECTION N/A 02/24/2020   Procedure: CESAREAN SECTION;  Surgeon: 04/25/2020, Feliberto Gottron, MD;  Location: [facility];  Service: Obstetrics;  Laterality: N/A;   CHOLECYSTECTOMY  2009   LAPAROSCOPIC GASTRIC BANDING  2009   Dr. 2010   LAPAROSCOPIC GASTRIC SLEEVE RESECTION  01/2013   TONSILLECTOMY  2006   UPPER GI ENDOSCOPY  2014   hiatial hernia, Dr. 2007   Family History  Problem Relation Age of Onset   High Cholesterol Mother    Cancer Maternal Grandmother        lung   Cancer Maternal Grandfather        lung   Cancer Paternal Grandmother        Breast   Heart disease Paternal Grandfather    Cancer Paternal Grandfather        renal cancer went to his lungs   Colon cancer Neg Hx    Thyroid cancer Neg Hx     Allergies: Ibuprofen and Derma guard [protective adhesive powder] Current Outpatient Medications on File Prior to Visit  Medication Sig Dispense Refill   cyanocobalamin (,VITAMIN B-12,) 1000 MCG/ML injection 1000 MCG (1 ML) INTRAMUSCULAR INJECTION IN THE THIGH ( VASTUS LATERALIS) ONCE PER WEEK FOR FOUR WEEKS, FOLLOWED BY 1000 MCG IM INJECTION ONCE PER MONTH. 12 mL 1    etonogestrel-ethinyl estradiol (NUVARING) 0.12-0.015 MG/24HR vaginal ring Place vaginally.     Ferrous Sulfate (IRON) 325 (65 Fe) MG TABS Take 1 tablet by mouth once a week.     FLUoxetine (PROZAC) 40 MG capsule Take 1 capsule (40 mg total) by mouth daily. 90 capsule 3   fluticasone (FLONASE) 50 MCG/ACT nasal spray Place 2 sprays into both nostrils daily as needed for allergies or rhinitis. 16 g 2   propranolol ER (INDERAL LA) 60 MG 24 hr capsule Take 60 mg by mouth daily.     tirzepatide (MOUNJARO) 7.5 MG/0.5ML Pen Inject 7.5 mg into the skin once a week. 6 mL 1   No current facility-administered medications on file prior to visit.    Social History   Tobacco Use   Smoking status: Never   Smokeless tobacco: Never  Substance Use Topics   Alcohol use: No    Comment: Maybe once a month   Drug use: No    Review of Systems  Constitutional:  Negative for chills and fever.  Respiratory:  Negative for cough.   Cardiovascular:  Negative for chest pain and palpitations.  Gastrointestinal:  Negative for nausea and vomiting.     Objective:  BP 104/62 (BP Location: Left Arm, Patient Position: Sitting, Cuff Size: Large)    Pulse 90    Temp 98.3 F (36.8 C) (Oral)    Ht 5\' 3"  (1.6 m)    Wt 220 lb 6.4 oz (100 kg)    SpO2 98%    BMI 39.04 kg/m  BP Readings from Last 3 Encounters:  08/31/21 104/62  06/27/21 100/80  05/16/21 106/70   Wt Readings from Last 3 Encounters:  08/31/21 220 lb 6.4 oz (100 kg)  06/27/21 234 lb 8 oz (106.4 kg)  05/16/21 248 lb 12.8 oz (112.9 kg)    Physical Exam Vitals reviewed.  Constitutional:      Appearance: She is well-developed.  Eyes:     Conjunctiva/sclera: Conjunctivae normal.  Cardiovascular:     Rate and Rhythm: Normal rate and regular rhythm.     Pulses: Normal pulses.     Heart sounds: Normal heart sounds.  Pulmonary:     Effort: Pulmonary effort is normal.     Breath sounds: Normal breath sounds. No wheezing, rhonchi or rales.  Skin:     General: Skin is warm and dry.  Neurological:     Mental Status: She is alert.  Psychiatric:        Speech: Speech normal.        Behavior: Behavior normal.        Thought Content: Thought content normal.       Assessment & Plan:   Problem List Items Addressed This Visit       Other   Anxiety and depression    Chronic, stable.  Continue 40 mg Prozac      Obesity (BMI 30-39.9)    Congratulated patient on weight loss . She will increase to Mounjaro 7.5 mg.  She will let me know how she is doing        I have discontinued Fariha K. Grussing's benzonatate. I am also having her maintain her fluticasone, etonogestrel-ethinyl estradiol, Iron, FLUoxetine, propranolol ER, cyanocobalamin, and tirzepatide.   No orders of the defined types were placed in this encounter.   Return precautions given.   Risks, benefits, and alternatives of the medications and treatment plan prescribed today were discussed, and patient expressed understanding.   Education regarding symptom management and diagnosis given to patient on AVS.  Continue to follow with 05/18/21, FNP for routine health maintenance.   Allegra Grana and I agreed with plan.   Sue Levels, FNP

## 2021-09-02 NOTE — Assessment & Plan Note (Signed)
Chronic, stable.  Continue 40 mg Prozac

## 2021-09-02 NOTE — Assessment & Plan Note (Addendum)
Congratulated patient on weight loss . She will increase to Mounjaro 7.5 mg.  She will let me know how she is doing

## 2021-10-05 ENCOUNTER — Encounter: Payer: Self-pay | Admitting: Family

## 2021-10-05 ENCOUNTER — Other Ambulatory Visit: Payer: Self-pay | Admitting: Family

## 2021-10-05 DIAGNOSIS — E669 Obesity, unspecified: Secondary | ICD-10-CM

## 2021-10-07 ENCOUNTER — Other Ambulatory Visit: Payer: Self-pay | Admitting: Family

## 2021-10-07 DIAGNOSIS — E669 Obesity, unspecified: Secondary | ICD-10-CM

## 2021-10-10 ENCOUNTER — Other Ambulatory Visit: Payer: Self-pay

## 2021-10-10 ENCOUNTER — Other Ambulatory Visit: Payer: Self-pay | Admitting: Family

## 2021-10-10 MED ORDER — TIRZEPATIDE 5 MG/0.5ML ~~LOC~~ SOAJ: 5.0000 mg | SUBCUTANEOUS | 1 refills | Status: DC

## 2021-10-14 ENCOUNTER — Encounter: Payer: Self-pay | Admitting: Family

## 2021-10-17 ENCOUNTER — Encounter: Payer: Self-pay | Admitting: Family

## 2021-10-19 ENCOUNTER — Encounter: Payer: Self-pay | Admitting: Family

## 2021-10-27 ENCOUNTER — Encounter: Payer: Self-pay | Admitting: Family

## 2021-10-27 NOTE — Telephone Encounter (Signed)
Pt came into office to drop off prescription request [facility]. Place in folder ?

## 2021-11-02 ENCOUNTER — Other Ambulatory Visit: Payer: Self-pay

## 2021-11-02 MED ORDER — TIRZEPATIDE 7.5 MG/0.5ML ~~LOC~~ SOAJ: 7.5000 mg | SUBCUTANEOUS | 1 refills | Status: DC

## 2021-11-02 NOTE — Telephone Encounter (Signed)
I called and spoke with [facility] for patient. She does have Medicaid but it did appear that they covered the Mounjaro 5 mg last time PA was done but they are mainly using patient's coupon card. He said to order & generate PA that he needed have the prescription of the 7.5 mg sent over so they could get in process of ordered & PA could be generated. [facility] to [facility]. ? ?I called patient as well & she is aware of this. She does have 5 weeks left of the 5 mg dose however & will wait on the 7.5 mg to come back in.  ?

## 2021-11-03 ENCOUNTER — Ambulatory Visit (INDEPENDENT_AMBULATORY_CARE_PROVIDER_SITE_OTHER): Payer: Medicaid Other | Admitting: Internal Medicine

## 2021-11-03 ENCOUNTER — Encounter: Payer: Self-pay | Admitting: Internal Medicine

## 2021-11-03 DIAGNOSIS — J0111 Acute recurrent frontal sinusitis: Secondary | ICD-10-CM | POA: Diagnosis not present

## 2021-11-03 MED ORDER — PREDNISONE 10 MG PO TABS: ORAL_TABLET | ORAL | 0 refills | Status: DC

## 2021-11-03 MED ORDER — AMOXICILLIN-POT CLAVULANATE 875-125 MG PO TABS: 1.0000 | ORAL_TABLET | Freq: Two times a day (BID) | ORAL | 0 refills | Status: DC

## 2021-11-03 MED ORDER — FLUCONAZOLE 150 MG PO TABS: 150.0000 mg | ORAL_TABLET | Freq: Every day | ORAL | 0 refills | Status: DC

## 2021-11-03 NOTE — Progress Notes (Signed)
? ?Subjective:  ?Patient ID: Sue Hernandez, female    DOB: 11-Mar-1981  Age: 41 y.o. MRN: [id number] ? ?CC: The encounter diagnosis was Acute recurrent frontal sinusitis. ? ? ?This visit occurred during the SARS-CoV-2 public health emergency.  Safety protocols were in place, including screening questions prior to the visit, additional usage of staff PPE, and extensive cleaning of exam room while observing appropriate contact time as indicated for disinfecting solutions.   ? ?HPI ?Sue Hernandez presents for  ?Chief Complaint  ?Patient presents with  ? Acute Visit  ?  Sinus infection  ? ?4 week history of allergic rhinitis not controlled with claritin and flonase . For the last 2 weeks has had sinus pain ,  purulent drainage despite use of otc decongestants and antihistamines .  NO WHEEZING OR COUGH,  lots of sinus drainage  . Hurts to irrigate currently.  ? ?History of sinus surgery years ago. Uses saline irrigation regularly   ? ?Outpatient Medications Prior to Visit  ?Medication Sig Dispense Refill  ? cyanocobalamin (,VITAMIN B-12,) 1000 MCG/ML injection 1000 MCG (1 ML) INTRAMUSCULAR INJECTION IN THE THIGH ( VASTUS LATERALIS) ONCE PER WEEK FOR FOUR WEEKS, FOLLOWED BY 1000 MCG IM INJECTION ONCE PER MONTH. 12 mL 1  ? etonogestrel-ethinyl estradiol (NUVARING) 0.12-0.015 MG/24HR vaginal ring Place vaginally.    ? Ferrous Sulfate (IRON) 325 (65 Fe) MG TABS Take 1 tablet by mouth once a week.    ? FLUoxetine (PROZAC) 40 MG capsule Take 1 capsule (40 mg total) by mouth daily. 90 capsule 3  ? fluticasone (FLONASE) 50 MCG/ACT nasal spray Place 2 sprays into both nostrils daily as needed for allergies or rhinitis. 16 g 2  ? propranolol ER (INDERAL LA) 60 MG 24 hr capsule Take 60 mg by mouth daily.    ? tirzepatide (MOUNJARO) 7.5 MG/0.5ML Pen Inject 7.5 mg into the skin once a week. 6 mL 1  ? ?No facility-administered medications prior to visit.  ? ? ?Review of Systems; ? ?Patient denies  fevers, , unintentional weight  loss, skin rash, eye pain,  sore throat, dysphagia,  hemoptysis , cough, dyspnea, wheezing, chest pain, palpitations, orthopnea, edema, abdominal pain, nausea, melena, diarrhea, constipation, flank pain, dysuria, hematuria, urinary  Frequency, nocturia, numbness, tingling, seizures,  Focal weakness, Loss of consciousness,  Tremor, insomnia, depression, anxiety, and suicidal ideation.   ? ? ? ?Objective:  ?There were no vitals taken for this visit. ? ?BP Readings from Last 3 Encounters:  ?08/31/21 104/62  ?06/27/21 100/80  ?05/16/21 106/70  ? ? ?Wt Readings from Last 3 Encounters:  ?08/31/21 220 lb 6.4 oz (100 kg)  ?06/27/21 234 lb 8 oz (106.4 kg)  ?05/16/21 248 lb 12.8 oz (112.9 kg)  ? ? ?General appearance: alert, cooperative and appears stated age ?Ears: normal TM's and external ear canals both ears ?Throat: lips, mucosa, and tongue normal; teeth and gums normal.  NO ULCERATIONS  ?Neck: mild cervical  adenopathy, no carotid bruit, supple, symmetrical, trachea midline and thyroid not enlarged, symmetric, no tenderness/mass/nodules ?Back: symmetric, no curvature. ROM normal. No CVA tenderness. ?Lungs: clear to auscultation bilaterally ?Heart: regular rate and rhythm, S1, S2 normal, no murmur, click, rub or gallop ?Abdomen: soft, non-tender; bowel sounds normal; no masses,  no organomegaly ?Pulses: 2+ and symmetric ?Skin: Skin color, texture, turgor normal. No rashes or lesions ?Lymph nodes: Cervical, supraclavicular, and axillary nodes normal. ? ?Lab Results  ?Component Value Date  ? HGBA1C 6.1 04/11/2021  ? HGBA1C 5.5 02/04/2015  ?  HGBA1C 5.6 09/22/2014  ? ? ?Lab Results  ?Component Value Date  ? CREATININE 0.80 04/11/2021  ? CREATININE 0.74 02/28/2020  ? CREATININE 0.88 02/27/2020  ? ? ?Lab Results  ?Component Value Date  ? WBC 7.4 08/25/2021  ? HGB 13.4 08/25/2021  ? HCT 41.2 08/25/2021  ? PLT 326.0 08/25/2021  ? GLUCOSE 84 04/11/2021  ? CHOL 203 (H) 04/11/2021  ? TRIG 170.0 (H) 04/11/2021  ? HDL 52.40 04/11/2021   ? LDLCALC 116 (H) 04/11/2021  ? ALT 12 04/11/2021  ? AST 11 04/11/2021  ? NA 140 04/11/2021  ? K 4.4 04/11/2021  ? CL 105 04/11/2021  ? CREATININE 0.80 04/11/2021  ? BUN 10 04/11/2021  ? CO2 27 04/11/2021  ? TSH 2.22 04/11/2021  ? INR 0.8 07/26/2012  ? HGBA1C 6.1 04/11/2021  ? ? ?DG Chest 2 View ? ?Result Date: 02/24/2020 ?CLINICAL DATA:  Tachycardia. Additional history provided: Patient [redacted] weeks pregnant, reportedly diagnosed with COVID last week, difficulty breathing. EXAM: CHEST - 2 VIEW COMPARISON:  Prior chest radiographs 07/25/2012 FINDINGS: Heart size within normal limits. There are fairly extensive multifocal airspace opacities bilaterally with a mid to lower lung predominance. No evidence of pleural effusion or pneumothorax. Incidentally noted azygos fissure. No acute bony abnormality identified. IMPRESSION: Fairly extensive bilateral multifocal airspace opacities with a mid to lower lung predominance. These findings likely reflect multifocal pneumonia given the provided history of COVID positivity. Electronically Signed   By: Kellie Simmering DO   On: 02/24/2020 10:38  ? ?US Venous Img Lower Bilateral (DVT) ? ?Result Date: 02/24/2020 ?CLINICAL DATA:  Tachycardia, shortness of breath. EXAM: Bilateral LOWER EXTREMITY VENOUS DOPPLER ULTRASOUND TECHNIQUE: Gray-scale sonography with compression, as well as color and duplex ultrasound, were performed to evaluate the deep venous system(s) from the level of the common femoral vein through the popliteal and proximal calf veins. COMPARISON:  None. FINDINGS: VENOUS Normal compressibility of the common femoral, superficial femoral, and popliteal veins, as well as the visualized calf veins. Visualized portions of profunda femoral vein and great saphenous vein unremarkable. No filling defects to suggest DVT on grayscale or color Doppler imaging. Doppler waveforms show normal direction of venous flow, normal respiratory plasticity and response to augmentation. Limited views of  the contralateral common femoral vein are unremarkable. OTHER None. Limitations: none IMPRESSION: No evidence of deep venous thrombosis seen in either lower extremity. Electronically Signed   By: Marijo Conception M.D.   On: 02/24/2020 11:54  ? ? ?Assessment & Plan:  ? ?Problem List Items Addressed This Visit   ? ? Sinusitis, acute frontal  ?  Symptoms present for over 3 weeks,  Worse for the last 2.  14 day treatment with augmentin advised, with prednisone taper   Decongestants and probiotics as well .  c dif precautions given ?  ?  ? Relevant Medications  ? amoxicillin-clavulanate (AUGMENTIN) 875-125 MG tablet  ? fluconazole (DIFLUCAN) 150 MG tablet  ? predniSONE (DELTASONE) 10 MG tablet  ? ? ?Follow-up: No follow-ups on file. ? ? ?Crecencio Mc, MD ?

## 2021-11-03 NOTE — Patient Instructions (Signed)
I am treating you for sinusitis/otitis which is a complication from your allergies due to  persistent sinus congestion. ? ? I am prescribing an antibiotic (augmentin ) and a prednisone taper  To manage the infection and the inflammation in your ear/sinuses.  ? ?Continue using  Afrin or sudafed PE every 12 hours for a few days for congestion  ? ?Increase your claritin to twice daily  ? ? ?Please take  (or continue)  a probiotic ( Align, Floraque or Culturelle) while you are on the antibiotic to prevent  the  serious antibiotic associated diarrhea  Called clostridium dificile colitis (if you develop profuse,  watery diarrhea,  call us immediately)   ?

## 2021-11-03 NOTE — Assessment & Plan Note (Signed)
Symptoms present for over 3 weeks,  Worse for the last 2.  14 day treatment with augmentin advised, with prednisone taper   Decongestants and probiotics as well .  c dif precautions given ?

## 2021-11-10 ENCOUNTER — Telehealth: Payer: Self-pay

## 2021-11-10 NOTE — Telephone Encounter (Signed)
LMTCB  to inform her that the PA was sent on 11/10/2021 ?

## 2021-11-10 NOTE — Telephone Encounter (Signed)
LMTCB  to inform her that the PA was sent on 11/10/2021 ?

## 2021-11-10 NOTE — Telephone Encounter (Signed)
PA was submitted on 11/10/2021 ?

## 2022-01-29 ENCOUNTER — Encounter: Payer: Self-pay | Admitting: Family

## 2022-01-30 ENCOUNTER — Other Ambulatory Visit: Payer: Self-pay

## 2022-01-30 MED ORDER — TIRZEPATIDE 10 MG/0.5ML ~~LOC~~ SOAJ: 10.0000 mg | SUBCUTANEOUS | 0 refills | Status: DC

## 2022-02-06 ENCOUNTER — Encounter: Payer: Self-pay | Admitting: Family

## 2022-02-06 ENCOUNTER — Ambulatory Visit (INDEPENDENT_AMBULATORY_CARE_PROVIDER_SITE_OTHER): Payer: Medicaid Other | Admitting: Family

## 2022-02-06 VITALS — BP 118/70 | HR 93 | Temp 98.4°F | Ht 63.0 in | Wt 194.0 lb

## 2022-02-06 DIAGNOSIS — E669 Obesity, unspecified: Secondary | ICD-10-CM

## 2022-02-06 DIAGNOSIS — D649 Anemia, unspecified: Secondary | ICD-10-CM | POA: Diagnosis not present

## 2022-02-06 DIAGNOSIS — F419 Anxiety disorder, unspecified: Secondary | ICD-10-CM

## 2022-02-06 DIAGNOSIS — F32A Depression, unspecified: Secondary | ICD-10-CM

## 2022-02-06 DIAGNOSIS — Z3009 Encounter for other general counseling and advice on contraception: Secondary | ICD-10-CM | POA: Diagnosis not present

## 2022-02-06 LAB — POCT GLYCOSYLATED HEMOGLOBIN (HGB A1C): Hemoglobin A1C: 5 % (ref 4.0–5.6)

## 2022-02-06 MED ORDER — TRAZODONE HCL 50 MG PO TABS: 25.0000 mg | ORAL_TABLET | Freq: Every day | ORAL | 3 refills | Status: DC

## 2022-02-06 MED ORDER — ETONOGESTREL-ETHINYL ESTRADIOL 0.12-0.015 MG/24HR VA RING: 1.0000 | VAGINAL_RING | VAGINAL | 3 refills | Status: DC

## 2022-02-06 NOTE — Assessment & Plan Note (Addendum)
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  Counseled on risk of CVA.  Unacceptable risk based on CDC contraceptive use table . [facility] and I sent patient a MyChart message and recommended an IUD and a discussion with GYN.

## 2022-02-06 NOTE — Patient Instructions (Addendum)
I placed a referral to psychiatry Let us know if you dont hear back within a week in regards to an appointment being scheduled.   Start trazodone 25 mg at night  Nice to see you!

## 2022-02-06 NOTE — Assessment & Plan Note (Signed)
Suboptimal control.  Continue Prozac 40 mg.  We agreed to adjunct with trazodone 25 to 50 mg.  Referral to psychiatry in place as well.  She will let me know how she is doing

## 2022-02-06 NOTE — Assessment & Plan Note (Signed)
Lab Results  Component Value Date   HGBA1C 5.0 02/06/2022   Patient will consider paying out of pocket for  mounjaro or trial of wegovy. She will let me know which she prefers.

## 2022-02-06 NOTE — Progress Notes (Signed)
Has not had monjaro in a week. Not able to get it. Wants to discuss alternative and talk about Prozac as well

## 2022-02-06 NOTE — Progress Notes (Signed)
Subjective:    Patient ID: Bascom Levels, female    DOB: Jul 17, 1981, 41 y.o.   MRN: [id number]  CC: KIMBERLYANN HOLLAR is a 41 y.o. female who presents today for follow up.   HPI: Insurance will not longer cover mounjaro.  She will consider Mounjaro or moving to [facility].  No personal or family history of thyroid cancer.  She has been happy on [facility], and it has 'quieted' her mind as it relates to worrying about her weight and what she will eat next.  She has lost 50 pounds.      She has tried lexapro, wellbutrin. She is compliant with prozac 40mg . She has decreased 'get up and go' and 'blah'. She feels more irritable.   Lexapro effectiveness wore off.   No si/hi  She is following with counselor.    No history of DVT. 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 This is not new for her. no vision loss. No h/o HTN No h/o CVA. Non smoker.    No history of cancer. Patient states she's not pregnant or breast-feeding.No concern for STDs.   Pap is due    HISTORY:  Past Medical History:  Diagnosis Date   COVID-19    fall 2021   Depression    Dr. 2022   Eating disorder    bulemia   Migraines    Past Surgical History:  Procedure Laterality Date   CESAREAN SECTION N/A 10/07/2016   Procedure: CESAREAN SECTION;  Surgeon: 10/09/2016, MD;  Location: [facility] ORS;  Service: Obstetrics;  Laterality: N/A;   CESAREAN SECTION N/A 02/24/2020   Procedure: CESAREAN SECTION;  Surgeon: 04/25/2020, Feliberto Gottron, MD;  Location: [facility] ORS;  Service: Obstetrics;  Laterality: N/A;   CHOLECYSTECTOMY  2009   LAPAROSCOPIC GASTRIC BANDING  2009   Dr. 2010   LAPAROSCOPIC GASTRIC SLEEVE RESECTION  01/2013   TONSILLECTOMY  2006   UPPER GI ENDOSCOPY  2014   hiatial hernia, Dr. 2007   Family History  Problem Relation Age of Onset   High Cholesterol Mother    Cancer Maternal Grandmother        lung   Cancer Maternal Grandfather        lung   Cancer Paternal Grandmother         Breast   Heart disease Paternal Grandfather    Cancer Paternal Grandfather        renal cancer went to his lungs   Colon cancer Neg Hx    Thyroid cancer Neg Hx     Allergies: Ibuprofen and Derma guard [protective adhesive powder] Current Outpatient Medications on File Prior to Visit  Medication Sig Dispense Refill   FLUoxetine (PROZAC) 40 MG capsule Take 1 capsule (40 mg total) by mouth daily. 90 capsule 3   propranolol ER (INDERAL LA) 60 MG 24 hr capsule Take 60 mg by mouth daily.     tirzepatide (MOUNJARO) 10 MG/0.5ML Pen Inject 10 mg into the skin once a week. (Patient not taking: Reported on 02/06/2022) 6 mL 0   No current facility-administered medications on file prior to visit.    Social History   Tobacco Use   Smoking status: Never   Smokeless tobacco: Never  Substance Use Topics   Alcohol use: No    Comment: Maybe once a month   Drug use: No    Review of Systems  Constitutional:  Negative for chills and fever.  Respiratory:  Negative for cough.   Cardiovascular:  Negative  for chest pain and palpitations.  Gastrointestinal:  Negative for nausea and vomiting.  Psychiatric/Behavioral:  Negative for sleep disturbance and suicidal ideas. The patient is not nervous/anxious.       Objective:    BP 118/70 (BP Location: Left Arm, Patient Position: Sitting, Cuff Size: Normal)   Pulse 93   Temp 98.4 F (36.9 C) (Oral)   Ht 5\' 3"  (1.6 m)   Wt 194 lb (88 kg)   LMP  (LMP Unknown)   SpO2 99%   BMI 34.37 kg/m  BP Readings from Last 3 Encounters:  02/06/22 118/70  11/03/21 120/62  08/31/21 104/62   Wt Readings from Last 3 Encounters:  02/06/22 194 lb (88 kg)  11/03/21 211 lb 9.6 oz (96 kg)  08/31/21 220 lb 6.4 oz (100 kg)    Physical Exam Vitals reviewed.  Constitutional:      Appearance: She is well-developed.  Eyes:     Conjunctiva/sclera: Conjunctivae normal.  Cardiovascular:     Rate and Rhythm: Normal rate and regular rhythm.     Pulses: Normal pulses.      Heart sounds: Normal heart sounds.  Pulmonary:     Effort: Pulmonary effort is normal.     Breath sounds: Normal breath sounds. No wheezing, rhonchi or rales.  Skin:    General: Skin is warm and dry.  Neurological:     Mental Status: She is alert.  Psychiatric:        Speech: Speech normal.        Behavior: Behavior normal.        Thought Content: Thought content normal.        Assessment & Plan:   Problem List Items Addressed This Visit       Other   Anxiety and depression - Primary    Suboptimal control.  Continue Prozac 40 mg.  We agreed to adjunct with trazodone 25 to 50 mg.  Referral to psychiatry in place as well.  She will let me know how she is doing      Relevant Medications   traZODone (DESYREL) 50 MG tablet   Other Relevant Orders   Ambulatory referral to Psychiatry   Birth control counseling    H/o migraine with aura with spotting in both eyes.  Counseled on risk of CVA.  Unacceptable risk based on CDC contraceptive use table . We have canceled prescription to [facility] and I sent patient a MyChart message and recommended an IUD and a discussion with GYN.      Obesity (BMI 30-39.9)    Lab Results  Component Value Date   HGBA1C 5.0 02/06/2022  Patient will consider paying out of pocket for  mounjaro or trial of wegovy. She will let me know which she prefers.       Other Visit Diagnoses     Anemia, unspecified type       Relevant Orders   POCT HgB A1C (Completed)        I have discontinued Carlean K. Riggins's fluticasone, etonogestrel-ethinyl estradiol, Iron, cyanocobalamin, amoxicillin-clavulanate, fluconazole, predniSONE, escitalopram, and etonogestrel-ethinyl estradiol. I am also having her start on traZODone. Additionally, I am having her maintain her FLUoxetine, propranolol ER, and tirzepatide.   Meds ordered this encounter  Medications   traZODone (DESYREL) 50 MG tablet    Sig: Take 0.5-1 tablets (25-50 mg total) by mouth at bedtime.     Dispense:  30 tablet    Refill:  3    Order Specific Question:   Supervising Provider  Answer:   Sherlene Shams [[id number]]   DISCONTD: etonogestrel-ethinyl estradiol (NUVARING) 0.12-0.015 MG/24HR vaginal ring    Sig: Place 1 each vaginally every 28 (twenty-eight) days.    Dispense:  3 each    Refill:  3    Order Specific Question:   Supervising Provider    Answer:   Sherlene Shams [[id number]]    Return precautions given.   Risks, benefits, and alternatives of the medications and treatment plan prescribed today were discussed, and patient expressed understanding.   Education regarding symptom management and diagnosis given to patient on AVS.  Continue to follow with Allegra Grana, FNP for routine health maintenance.   Bascom Levels and I agreed with plan.   Rennie Plowman, FNP

## 2022-03-02 ENCOUNTER — Other Ambulatory Visit: Payer: Self-pay | Admitting: Family

## 2022-03-02 DIAGNOSIS — F32A Depression, unspecified: Secondary | ICD-10-CM

## 2022-03-13 ENCOUNTER — Ambulatory Visit: Payer: Self-pay

## 2022-03-13 ENCOUNTER — Ambulatory Visit: Payer: Medicaid Other

## 2022-03-24 ENCOUNTER — Encounter: Payer: Self-pay | Admitting: Family

## 2022-03-28 ENCOUNTER — Other Ambulatory Visit: Payer: Self-pay | Admitting: Family

## 2022-03-28 ENCOUNTER — Telehealth: Payer: Medicaid Other | Admitting: Physician Assistant

## 2022-03-28 DIAGNOSIS — U071 COVID-19: Secondary | ICD-10-CM

## 2022-03-28 DIAGNOSIS — G43909 Migraine, unspecified, not intractable, without status migrainosus: Secondary | ICD-10-CM

## 2022-03-28 MED ORDER — MOLNUPIRAVIR EUA 200MG CAPSULE: 4.0000 | ORAL_CAPSULE | Freq: Two times a day (BID) | ORAL | 0 refills | Status: AC

## 2022-03-28 MED ORDER — PROPRANOLOL HCL ER 60 MG PO CP24: 60.0000 mg | ORAL_CAPSULE | Freq: Every day | ORAL | 3 refills | Status: DC

## 2022-03-28 NOTE — Patient Instructions (Signed)
Sue Hernandez, thank you for joining Piedad Climes, PA-C for today's virtual visit.  While this provider is not your primary care provider (PCP), if your PCP is located in our provider database this encounter information will be shared with them immediately following your visit.  Consent: (Patient) Sue Hernandez provided verbal consent for this virtual visit at the beginning of the encounter.  Current Medications:  Current Outpatient Medications:    FLUoxetine (PROZAC) 40 MG capsule, Take 1 capsule (40 mg total) by mouth daily., Disp: 90 capsule, Rfl: 3   propranolol ER (INDERAL LA) 60 MG 24 hr capsule, Take 1 capsule (60 mg total) by mouth daily., Disp: 90 capsule, Rfl: 3   tirzepatide (MOUNJARO) 10 MG/0.5ML Pen, Inject 10 mg into the skin once a week. (Patient not taking: Reported on 02/06/2022), Disp: 6 mL, Rfl: 0   traZODone (DESYREL) 50 MG tablet, TAKE 1/2 TO 1 TABLET (25 TO 50 MG TOTAL) BY MOUTH AT BEDTIME, Disp: 90 tablet, Rfl: 2   Medications ordered in this encounter:  No orders of the defined types were placed in this encounter.    *If you need refills on other medications prior to your next appointment, please contact your pharmacy*  Follow-Up: Call back or seek an in-person evaluation if the symptoms worsen or if the condition fails to improve as anticipated.  Other Instructions Please keep well-hydrated and get plenty of rest. Start a saline nasal rinse to flush out your nasal passages. You can use plain Mucinex to help thin congestion. If you have a humidifier, running in the bedroom at night. I want you to start OTC vitamin D3 1000 units daily, vitamin C 1000 mg daily, and a zinc supplement. Please take prescribed medications as directed.  You have been enrolled in a MyChart symptom monitoring program. Please answer these questions daily so we can keep track of how you are doing.  You were to quarantine for 5 days from onset of your symptoms.  After day  5, if you have had no fever and you are feeling better, you can end quarantine but need to mask for an additional 5 days. After day 5 if you have a fever or are having significant symptoms, please quarantine for full 10 days.  If you note any worsening of symptoms, any significant shortness of breath or any chest pain, please seek ER evaluation ASAP.  Please do not delay care!  COVID-19: What to Do if You Are Sick If you test positive and are an older adult or someone who is at high risk of getting very sick from COVID-19, treatment may be available. Contact a healthcare provider right away after a positive test to determine if you are eligible, even if your symptoms are mild right now. You can also visit a Test to Treat location and, if eligible, receive a prescription from a provider. Don't delay: Treatment must be started within the first few days to be effective. If you have a fever, cough, or other symptoms, you might have COVID-19. Most people have mild illness and are able to recover at home. If you are sick: Keep track of your symptoms. If you have an emergency warning sign (including trouble breathing), call 911. Steps to help prevent the spread of COVID-19 if you are sick If you are sick with COVID-19 or think you might have COVID-19, follow the steps below to care for yourself and to help protect other people in your home and community. Stay home except to  get medical care Stay home. Most people with COVID-19 have mild illness and can recover at home without medical care. Do not leave your home, except to get medical care. Do not visit public areas and do not go to places where you are unable to wear a mask. Take care of yourself. Get rest and stay hydrated. Take over-the-counter medicines, such as acetaminophen, to help you feel better. Stay in touch with your doctor. Call before you get medical care. Be sure to get care if you have trouble breathing, or have any other emergency warning  signs, or if you think it is an emergency. Avoid public transportation, ride-sharing, or taxis if possible. Get tested If you have symptoms of COVID-19, get tested. While waiting for test results, stay away from others, including staying apart from those living in your household. Get tested as soon as possible after your symptoms start. Treatments may be available for people with COVID-19 who are at risk for becoming very sick. Don't delay: Treatment must be started early to be effective--some treatments must begin within 5 days of your first symptoms. Contact your healthcare provider right away if your test result is positive to determine if you are eligible. Self-tests are one of several options for testing for the virus that causes COVID-19 and may be more convenient than laboratory-based tests and point-of-care tests. Ask your healthcare provider or your local health department if you need help interpreting your test results. You can visit your state, tribal, local, and territorial health department's website to look for the latest local information on testing sites. Separate yourself from other people As much as possible, stay in a specific room and away from other people and pets in your home. If possible, you should use a separate bathroom. If you need to be around other people or animals in or outside of the home, wear a well-fitting mask. Tell your close contacts that they may have been exposed to COVID-19. An infected person can spread COVID-19 starting 48 hours (or 2 days) before the person has any symptoms or tests positive. By letting your close contacts know they may have been exposed to COVID-19, you are helping to protect everyone. See COVID-19 and Animals if you have questions about pets. If you are diagnosed with COVID-19, someone from the health department may call you. Answer the call to slow the spread. Monitor your symptoms Symptoms of COVID-19 include fever, cough, or other  symptoms. Follow care instructions from your healthcare provider and local health department. Your local health authorities may give instructions on checking your symptoms and reporting information. When to seek emergency medical attention Look for emergency warning signs* for COVID-19. If someone is showing any of these signs, seek emergency medical care immediately: Trouble breathing Persistent pain or pressure in the chest New confusion Inability to wake or stay awake Pale, gray, or blue-colored skin, lips, or nail beds, depending on skin tone *This list is not all possible symptoms. Please call your medical provider for any other symptoms that are severe or concerning to you. Call 911 or call ahead to your local emergency facility: Notify the operator that you are seeking care for someone who has or may have COVID-19. Call ahead before visiting your doctor Call ahead. Many medical visits for routine care are being postponed or done by phone or telemedicine. If you have a medical appointment that cannot be postponed, call your doctor's office, and tell them you have or may have COVID-19. This will help the office  protect themselves and other patients. If you are sick, wear a well-fitting mask You should wear a mask if you must be around other people or animals, including pets (even at home). Wear a mask with the best fit, protection, and comfort for you. You don't need to wear the mask if you are alone. If you can't put on a mask (because of trouble breathing, for example), cover your coughs and sneezes in some other way. Try to stay at least 6 feet away from other people. This will help protect the people around you. Masks should not be placed on young children under age 76 years, anyone who has trouble breathing, or anyone who is not able to remove the mask without help. Cover your coughs and sneezes Cover your mouth and nose with a tissue when you cough or sneeze. Throw away used tissues in  a lined trash can. Immediately wash your hands with soap and water for at least 20 seconds. If soap and water are not available, clean your hands with an alcohol-based hand sanitizer that contains at least 60% alcohol. Clean your hands often Wash your hands often with soap and water for at least 20 seconds. This is especially important after blowing your nose, coughing, or sneezing; going to the bathroom; and before eating or preparing food. Use hand sanitizer if soap and water are not available. Use an alcohol-based hand sanitizer with at least 60% alcohol, covering all surfaces of your hands and rubbing them together until they feel dry. Soap and water are the best option, especially if hands are visibly dirty. Avoid touching your eyes, nose, and mouth with unwashed hands. Handwashing Tips Avoid sharing personal household items Do not share dishes, drinking glasses, cups, eating utensils, towels, or bedding with other people in your home. Wash these items thoroughly after using them with soap and water or put in the dishwasher. Clean surfaces in your home regularly Clean and disinfect high-touch surfaces (for example, doorknobs, tables, handles, light switches, and countertops) in your "sick room" and bathroom. In shared spaces, you should clean and disinfect surfaces and items after each use by the person who is ill. If you are sick and cannot clean, a caregiver or other person should only clean and disinfect the area around you (such as your bedroom and bathroom) on an as needed basis. Your caregiver/other person should wait as long as possible (at least several hours) and wear a mask before entering, cleaning, and disinfecting shared spaces that you use. Clean and disinfect areas that may have blood, stool, or body fluids on them. Use household cleaners and disinfectants. Clean visible dirty surfaces with household cleaners containing soap or detergent. Then, use a household disinfectant. Use a  product from [employer] List N: Disinfectants for Coronavirus (COVID-19). Be sure to follow the instructions on the label to ensure safe and effective use of the product. Many products recommend keeping the surface wet with a disinfectant for a certain period of time (look at "contact time" on the product label). You may also need to wear personal protective equipment, such as gloves, depending on the directions on the product label. Immediately after disinfecting, wash your hands with soap and water for 20 seconds. For completed guidance on cleaning and disinfecting your home, visit Complete Disinfection Guidance. Take steps to improve ventilation at home Improve ventilation (air flow) at home to help prevent from spreading COVID-19 to other people in your household. Clear out COVID-19 virus particles in the air by opening windows, using  air filters, and turning on fans in your home. Use this interactive tool to learn how to improve air flow in your home. When you can be around others after being sick with COVID-19 Deciding when you can be around others is different for different situations. Find out when you can safely end home isolation. For any additional questions about your care, contact your healthcare provider or state or local health department. 10/12/2020 Content source: [facility] ([facility]), Division of Viral Diseases This information is not intended to replace advice given to you by your health care provider. Make sure you discuss any questions you have with your health care provider. Document Revised: 11/25/2020 Document Reviewed: 11/25/2020 Elsevier Patient Education  2022 [employer].      If you have been instructed to have an in-person evaluation today at a local Urgent Care facility, please use the link below. It will take you to a list of all of our available [facility], including address, phone number and hours of  operation. Please do not delay care.  [facility]  If you or a family member do not have a primary care provider, use the link below to schedule a visit and establish care. When you choose a [facility] primary care physician or advanced practice provider, you gain a long-term partner in health. Find a Primary Care Provider  Learn more about [facility] in-office and virtual care options: [facility] - Get Care Now

## 2022-03-28 NOTE — Progress Notes (Signed)
Virtual Visit Consent   Sue Hernandez, you are scheduled for a virtual visit with a [facility] provider today. Just as with appointments in the office, your consent must be obtained to participate. Your consent will be active for this visit and any virtual visit you may have with one of our providers in the next 365 days. If you have a MyChart account, a copy of this consent can be sent to you electronically.  As this is a virtual visit, video technology does not allow for your provider to perform a traditional examination. This may limit your provider's ability to fully assess your condition. If your provider identifies any concerns that need to be evaluated in person or the need to arrange testing (such as labs, EKG, etc.), we will make arrangements to do so. Although advances in technology are sophisticated, we cannot ensure that it will always work on either your end or our end. If the connection with a video visit is poor, the visit may have to be switched to a telephone visit. With either a video or telephone visit, we are not always able to ensure that we have a secure connection.  By engaging in this virtual visit, you consent to the provision of healthcare and authorize for your insurance to be billed (if applicable) for the services provided during this visit. Depending on your insurance coverage, you may receive a charge related to this service.  I need to obtain your verbal consent now. Are you willing to proceed with your visit today? Sue Hernandez has provided verbal consent on 03/28/2022 for a virtual visit (video or telephone). Piedad Climes, [place]  Date: 03/28/2022 7:09 PM  Virtual Visit via Video Note   I, Piedad Climes, connected with  Sue Hernandez  ([id number], 1980-09-16) on 03/28/22 at  7:00 PM EDT by a video-enabled telemedicine application and verified that I am speaking with the correct person using two identifiers.  Location: Patient: Virtual Visit  Location Patient: Home Provider: Virtual Visit Location Provider: Home Office   I discussed the limitations of evaluation and management by telemedicine and the availability of in person appointments. The patient expressed understanding and agreed to proceed.    History of Present Illness: Sue Hernandez is a 41 y.o. who identifies as a female who was assigned female at birth, and is being seen today for COVID-19. Notes exposure from her mother and grandfather over weekend. Symptoms starting this morning with fatigue, chills, malaise and some GI symptoms. Denies chest pain, SOB or substantial cough. Tested positive this morning. Had COVID before and ended up int he hospital so she would like to discuss antivirals.  HPI: HPI  Problems:  Patient Active Problem List   Diagnosis Date Noted   Birth control counseling 02/06/2022   Bronchitis 06/27/2021   Fatigue 05/16/2021   B12 deficiency 04/29/2021   Post-operative state 02/25/2020   Pneumonia due to COVID-19 virus 02/24/2020   Diarrhea 02/24/2020   Pneumonia complicating pregnancy 02/24/2020   Indication for care in labor and delivery, antepartum 10/06/2016   Indication for care in labor or delivery 10/06/2016   Polyhydramnios affecting pregnancy 09/28/2016   Sinusitis, acute frontal 07/20/2015   Obesity (BMI 30-39.9) 08/30/2012   Anxiety and depression 08/30/2012   Migraine 08/30/2012    Allergies:  Allergies  Allergen Reactions   Ibuprofen Swelling    Swelling of face and hands   Derma Guard [Protective Adhesive Powder]     Dermabond - urticaria  Medications:  Current Outpatient Medications:    molnupiravir EUA (LAGEVRIO) 200 mg CAPS capsule, Take 4 capsules (800 mg total) by mouth 2 (two) times daily for 5 days., Disp: 40 capsule, Rfl: 0   FLUoxetine (PROZAC) 40 MG capsule, Take 1 capsule (40 mg total) by mouth daily., Disp: 90 capsule, Rfl: 3   propranolol ER (INDERAL LA) 60 MG 24 hr capsule, Take 1 capsule (60 mg total)  by mouth daily., Disp: 90 capsule, Rfl: 3   tirzepatide (MOUNJARO) 10 MG/0.5ML Pen, Inject 10 mg into the skin once a week. (Patient not taking: Reported on 02/06/2022), Disp: 6 mL, Rfl: 0   traZODone (DESYREL) 50 MG tablet, TAKE 1/2 TO 1 TABLET (25 TO 50 MG TOTAL) BY MOUTH AT BEDTIME, Disp: 90 tablet, Rfl: 2  Observations/Objective: Patient is well-developed, well-nourished in no acute distress.  Resting comfortably at home.  Head is normocephalic, atraumatic.  No labored breathing. Speech is clear and coherent with logical content.  Patient is alert and oriented at baseline.   Assessment and Plan: 1. COVID-19 - molnupiravir EUA (LAGEVRIO) 200 mg CAPS capsule; Take 4 capsules (800 mg total) by mouth 2 (two) times daily for 5 days.  Dispense: 40 capsule; Refill: 0 - MyChart COVID-19 home monitoring program; Future  Patient with multiple risk factors for complicated course of illness. Discussed risks/benefits of antiviral medications including most common potential ADRs. Patient voiced understanding and would like to proceed with antiviral medication. They are candidate for Molnupiravir. Rx sent to pharmacy. Supportive measures, OTC medications and vitamin regimen reviewed. Patient has been enrolled in a MyChart COVID symptom monitoring program. Anne Shutter reviewed in detail. Strict ER precautions discussed with patient.    Follow Up Instructions: I discussed the assessment and treatment plan with the patient. The patient was provided an opportunity to ask questions and all were answered. The patient agreed with the plan and demonstrated an understanding of the instructions.  A copy of instructions were sent to the patient via MyChart unless otherwise noted below.    The patient was advised to call back or seek an in-person evaluation if the symptoms worsen or if the condition fails to improve as anticipated.  Time:  I spent 10 minutes with the patient via telehealth technology discussing  the above problems/concerns.    Piedad Climes, PA-C

## 2022-03-30 ENCOUNTER — Telehealth: Payer: Self-pay

## 2022-03-30 NOTE — Progress Notes (Unsigned)
Virtual Visit via Video Note  I connected with Sue Hernandez on 04/03/22 at  9:00 AM EDT by a video enabled telemedicine application and verified that I am speaking with the correct person using two identifiers.  Location: Patient: home Provider: office Persons participated in the visit- patient, provider    I discussed the limitations of evaluation and management by telemedicine and the availability of in person appointments. The patient expressed understanding and agreed to proceed.     I discussed the assessment and treatment plan with the patient. The patient was provided an opportunity to ask questions and all were answered. The patient agreed with the plan and demonstrated an understanding of the instructions.   The patient was advised to call back or seek an in-person evaluation if the symptoms worsen or if the condition fails to improve as anticipated.  I provided 45 minutes of non-face-to-face time during this encounter.   Neysa Hotter, MD     Psychiatric Initial Adult Assessment   Patient Identification: Sue Hernandez MRN:  [id number] Date of Evaluation:  04/03/2022 Referral Source: Allegra Grana, FNP  Chief Complaint:  No chief complaint on file.  Visit Diagnosis:    ICD-10-CM   1. MDD (major depressive disorder), recurrent episode, moderate (HCC)  F33.1     2. 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 year old female with a history of depression, anxiety, migraine, who is referred for depression.   She states that she has been struggling with depression since age 66 as well as eating disorder.  She reports that there has been a huge change in 2016 after she got married.  Although her depression was manageable with the Lexapro and the bupropion, she had worsening in depression in the context of having her second child in 2021.  She describes this as traumatic, referring to suffering from  COVID pneumonia when she was [redacted] weeks pregnant.  She had C-section , and was discharged with home oxygen. She does not recall well around that time.  She states that it was "incredibly disappointing." Her son suffered from reflux and RSV.  She had challenges in connection with her oldest son around that time as she did not have emotional energy left.  She states that there was worsening in depression in fall 2022 ("hit rock bottom"), although it has been improving since she started to see a Veterinary surgeon.  Although she feels even keeled, she feels numb. She needs to force herself for socialization. She is concerned as she tends to be verbally aggressive to her children and others, although she denies physical aggression. She has tremendous amount of guilt, feeling that she wasted time due to sadness. She thinks her husband is scared of her depression. There are "cultural" differences (her husband is black), referring to him being spiritual.   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  She also feels "stupid."  She has difficulty in initiating and starting tasks.   Depression-she has anhedonia.  Although she always takes care of her children , her house is "wreck." She does bare minimum due to her mood. She denies SI.   Eating disorder-she reports history of bulimia in middle school.  She had "body dysmorphic disorder."  She reports tendency to binge and purge as a way to cope. She had two bariatric surgery with limited help in this  aspect. She states that it has been "far more in control" since she started [facility] for weight loss.  However, she is still unable to find joy in the appearance, and has some concern about body image.  She may do binge eating once a month, and had a bulimia 2 weeks ago.  She denies any history of anorexia, referring to her mother, who was anorexic growing up.   Substance- she drinks once a year. She denies drug use.   Medication-  fluoxetine 40 mg daily (took edge off, not losing her mind anymore), trazodone (not taking)   Support: husband Household: husband, 2 children Marital status: married Number of children: 2 (2 and 5 yo) Employment: unemployed, used to work as a Surveyor, mining:   Last PCP / ongoing medical evaluation:      Associated Signs/Symptoms: Depression Symptoms:  depressed mood, anhedonia, fatigue, difficulty concentrating, (Hypo) Manic Symptoms:   denies decreased need for sleep, euphoria Anxiety Symptoms:   mild anxiety Psychotic Symptoms:   denies AH, VH, paranoia PTSD Symptoms: Negative  Past Psychiatric History:  Outpatient:  Psychiatry admission: denies  Previous suicide attempt: denies Past trials of medication: lexapro, bupropion, fluoxetine History of violence:  denies  Previous Psychotropic Medications: Yes   Substance Abuse History in the last 12 months:  No.  Consequences of Substance Abuse: NA  Past Medical History:  Past Medical History:  Diagnosis Date   COVID-19    fall 2021   Depression    Dr. Ardelle Anton   Eating disorder    bulemia   Migraines     Past Surgical History:  Procedure Laterality Date   CESAREAN SECTION N/A 10/07/2016   Procedure: CESAREAN SECTION;  Surgeon: Christeen Douglas, MD;  Location: [facility] ORS;  Service: Obstetrics;  Laterality: N/A;   CESAREAN SECTION N/A 02/24/2020   Procedure: CESAREAN SECTION;  Surgeon: Feliberto Gottron, Ihor Austin, MD;  Location: [facility] ORS;  Service: Obstetrics;  Laterality: N/A;   CHOLECYSTECTOMY  2009   LAPAROSCOPIC GASTRIC BANDING  2009   Dr. Terrill Mohr   LAPAROSCOPIC GASTRIC SLEEVE RESECTION  01/2013   TONSILLECTOMY  2006   UPPER GI ENDOSCOPY  2014   hiatial hernia, Dr. Smitty Cords    Family Psychiatric History: as below  Family History:  Family History  Problem Relation Age of Onset   Depression Mother    High Cholesterol Mother    Anorexia nervosa Mother    ADD / ADHD Brother    Anxiety disorder Brother    Cancer  Maternal Grandfather        lung   Cancer Maternal Grandmother        lung   Heart disease Paternal Grandfather    Cancer Paternal Grandfather        renal cancer went to his lungs   Cancer Paternal Grandmother        Breast   Colon cancer Neg Hx    Thyroid cancer Neg Hx     Social History:   Social History   Socioeconomic History   Marital status: Married    Spouse name: Not on file   Number of children: Not on file   Years of education: Not on file   Highest education level: Not on file  Occupational History   Not on file  Tobacco Use   Smoking status: Never   Smokeless tobacco: Never  Substance and Sexual Activity   Alcohol use: No    Comment: Maybe once a month   Drug use: No   Sexual  activity: Yes  Other Topics Concern   Not on file  Social History Narrative   Lives in [place] with husband      Work - home      Two children- 4 years, 1 years      Diet - regular, followed bariatric clinic   Social Determinants of Health   Financial Resource Strain: Not on file  Food Insecurity: Not on file  Transportation Needs: Not on file  Physical Activity: Not on file  Stress: Not on file  Social Connections: Not on file    Additional Social History: as above  Allergies:   Allergies  Allergen Reactions   Ibuprofen Swelling    Swelling of face and hands   Derma Guard [Protective Adhesive Powder]     Dermabond - urticaria    Metabolic Disorder Labs: Lab Results  Component Value Date   HGBA1C 5.0 02/06/2022   No results found for: "PROLACTIN" Lab Results  Component Value Date   CHOL 203 (H) 04/11/2021   TRIG 170.0 (H) 04/11/2021   HDL 52.40 04/11/2021   CHOLHDL 4 04/11/2021   VLDL 34.0 04/11/2021   LDLCALC 116 (H) 04/11/2021   LDLCALC 86 09/22/2014   Lab Results  Component Value Date   TSH 2.22 04/11/2021    Therapeutic Level Labs: No results found for: "LITHIUM" No results found for: "CBMZ" No results found for: "VALPROATE"  Current  Medications: Current Outpatient Medications  Medication Sig Dispense Refill   escitalopram (LEXAPRO) 10 MG tablet 5 mg daily for one week, then 10 mg daily 30 tablet 1   tirzepatide (MOUNJARO) 10 MG/0.5ML Pen Inject 10 mg into the skin once a week. 6 mL 0   FLUoxetine (PROZAC) 40 MG capsule Take 1 capsule (40 mg total) by mouth daily. 90 capsule 3   propranolol ER (INDERAL LA) 60 MG 24 hr capsule Take 1 capsule (60 mg total) by mouth daily. 90 capsule 3   traZODone (DESYREL) 50 MG tablet TAKE 1/2 TO 1 TABLET (25 TO 50 MG TOTAL) BY MOUTH AT BEDTIME (Patient not taking: Reported on 04/03/2022) 90 tablet 2   No current facility-administered medications for this visit.    Musculoskeletal: Strength & Muscle Tone:  N/A Gait & Station:  N/A Patient leans: N/A  Psychiatric Specialty Exam: Review of Systems  Psychiatric/Behavioral:  Positive for decreased concentration, dysphoric mood and sleep disturbance. Negative for agitation, behavioral problems, confusion, hallucinations, self-injury and suicidal ideas. The patient is nervous/anxious. The patient is not hyperactive.   All other systems reviewed and are negative.   There were no vitals taken for this visit.There is no height or weight on file to calculate BMI.  General Appearance: Fairly Groomed  Eye Contact:  Good  Speech:  Clear and Coherent  Volume:  Normal  Mood:   numb  Affect:  Appropriate, Congruent, and calm  Thought Process:  Coherent  Orientation:  Full (Time, Place, and Person)  Thought Content:  Logical  Suicidal Thoughts:  No  Homicidal Thoughts:  No  Memory:  Immediate;   Good  Judgement:  Good  Insight:  Good  Psychomotor Activity:  Normal  Concentration:  Concentration: Good and Attention Span: Good  Recall:  Good  Fund of Knowledge:Good  Language: Good  Akathisia:  No  Handed:  Right  AIMS (if indicated):  not done  Assets:  Communication Skills Desire for Improvement  ADL's:  Intact  Cognition: WNL   Sleep:  Good   Screenings: GAD-7    Flowsheet  Row Office Visit from 02/06/2022 in [facility] Office Visit from 06/27/2021 in [facility]  Total GAD-7 Score 3 5      PHQ2-9    Flowsheet Row Office Visit from 02/06/2022 in [facility] Office Visit from 06/27/2021 in [facility] Office Visit from 04[facility]04/2021 in [facility] Visit from 02/27/2019 in [facility]  PHQ-2 Total Score 3 2 6  0 0  PHQ-9 Total Score 6 10 18  -- --       Assessment and Plan:  Sue Hernandez is a 41 y.o. year old female with a history of depression, anxiety, migraine, who is referred for depression.    1. MDD (major depressive disorder), recurrent episode, moderate (HCC) She reports depressive symptoms with anhedonia and irritability, although there has been overall improvement since starting to see a therapist.  Psychosocial stressors includes taking care of her 2 children.  She reports limited benefit from fluoxetine; will switch to Lexapro given she reports good benefit in the past.  She will greatly benefit from CBT; she will continue to see a therapist.   2. Other specified eating disorder She reports history of bulimia, binge eating, although she denies history of anorexia.  She struggles with body images, although it has been reportedly improving since starting Monjaro.  Will continue to assess this.   Plan Start lexapro 5 mg daily for one week, then 10 mg daily  Discontinue fluoxetine (was on 40 mg daily) Next appointment: 10/30 at 9 AM for 30 mins, in person   The patient demonstrates the following risk factors for suicide: Chronic risk factors for suicide include: psychiatric disorder of depression, anxiety . Acute risk factors for suicide include: unemployment. Protective factors for this patient include: positive social  support, responsibility to others (children, family), coping skills, and hope for the future. Considering these factors, the overall suicide risk at this point appears to be low. Patient is appropriate for outpatient follow up.   Collaboration of Care: Other reviewed notes in Epic  Patient/Guardian was advised Release of Information must be obtained prior to any record release in order to collaborate their care with an outside provider. Patient/Guardian was advised if they have not already done so to contact the registration department to sign all necessary forms in order for 41 to release information regarding their care.   Consent: Patient/Guardian gives verbal consent for treatment and assignment of benefits for services provided during this visit. Patient/Guardian expressed understanding and agreed to proceed.   11/30, MD 9/11/202312:22 PM

## 2022-03-30 NOTE — Telephone Encounter (Signed)
Called patient in regards to COVID-19 questionnaire on my chart. No answer, left VM to call back if she has any questions.

## 2022-04-03 ENCOUNTER — Telehealth (INDEPENDENT_AMBULATORY_CARE_PROVIDER_SITE_OTHER): Payer: Medicaid Other | Admitting: Psychiatry

## 2022-04-03 ENCOUNTER — Encounter: Payer: Self-pay | Admitting: Psychiatry

## 2022-04-03 DIAGNOSIS — F5089 Other specified eating disorder: Secondary | ICD-10-CM | POA: Diagnosis not present

## 2022-04-03 DIAGNOSIS — F331 Major depressive disorder, recurrent, moderate: Secondary | ICD-10-CM

## 2022-04-03 MED ORDER — ESCITALOPRAM OXALATE 10 MG PO TABS: ORAL_TABLET | ORAL | 1 refills | Status: DC

## 2022-04-03 NOTE — Patient Instructions (Signed)
Start lexapro 5 mg daily for one week, then 10 mg daily  Discontinue fluoxetine Next appointment: 10/30 at 9 AM

## 2022-04-05 ENCOUNTER — Encounter: Payer: Medicaid Other | Admitting: Family

## 2022-04-12 ENCOUNTER — Encounter: Payer: Medicaid Other | Admitting: Family

## 2022-05-02 ENCOUNTER — Other Ambulatory Visit: Payer: Self-pay | Admitting: Psychiatry

## 2022-05-17 NOTE — Progress Notes (Signed)
BH MD/PA/NP OP Progress Note  05/22/2022 9:31 AM Sue Hernandez  MRN:  [id number]  Chief Complaint:  Chief Complaint  Patient presents with   Follow-up   HPI:  This is a follow-up appointment for depression.  She states that there has been a huge increase in anger.  She discontinued OCP 2.5 months ago.  She notices that her mood worsens extremely before menstrual period.  Although she denies any physical aggression, she tends to be yelling at her children.  Although she does not feel depressed, she feels guilty due to this.  She also states that her husband has been out for travels on weekends for the past several weeks.  Although he is willing to help her when he is at home, she does not know how to enjoy herself.  She used to enjoy crafts and an exercise.  She thinks she lost herself, and is still in survival mode, "fight or flight" since struggling depression a few years ago.  There has been up and down of binge eating since she takes Mounjaro every other week due to financial strain.  She is planning to take lower dose consistently. The patient has mood symptoms as in PHQ-9/GAD-7.  She denies SI.  She denies alcohol use, drug use.  She is willing to try higher dose of Lexapro at this time.   Support: husband Household: husband, 2 children Marital status: married Number of children: 2 (2 and 39 yo) Employment: unemployed, used to work as a Surveyor, mining:   Last PCP / ongoing medical evaluation:    Wt Readings from Last 3 Encounters:  05/22/22 192 lb 12.8 oz (87.5 kg)  02/06/22 194 lb (88 kg)  11/03/21 211 lb 9.6 oz (96 kg)     Visit Diagnosis:    ICD-10-CM   1. MDD (major depressive disorder), recurrent episode, moderate (HCC)  F33.1     2. 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 I have reviewed the history. No updates at this time.     Past Medical History:  Past Medical History:   Diagnosis Date   COVID-19    fall 2021   Depression    Dr. Ardelle Anton   Eating disorder    bulemia   Migraines     Past Surgical History:  Procedure Laterality Date   CESAREAN SECTION N/A 10/07/2016   Procedure: CESAREAN SECTION;  Surgeon: Christeen Douglas, MD;  Location: [facility];  Service: Obstetrics;  Laterality: N/A;   CESAREAN SECTION N/A 02/24/2020   Procedure: CESAREAN SECTION;  Surgeon: Feliberto Gottron, Ihor Austin, MD;  Location: [facility];  Service: Obstetrics;  Laterality: N/A;   CHOLECYSTECTOMY  2009   LAPAROSCOPIC GASTRIC BANDING  2009   Dr. Terrill Mohr   LAPAROSCOPIC GASTRIC SLEEVE RESECTION  01/2013   TONSILLECTOMY  2006   UPPER GI ENDOSCOPY  2014   hiatial hernia, Dr. Smitty Cords    Family Psychiatric History: Please see initial evaluation for full details. I have reviewed the history. No updates at this time.     Family History:  Family History  Problem Relation Age of Onset   Depression Mother    High Cholesterol Mother    Anorexia nervosa Mother    ADD / ADHD Brother    Anxiety disorder Brother    Cancer Maternal Grandfather        lung   Cancer Maternal Grandmother        lung  Heart disease Paternal Grandfather    Cancer Paternal Grandfather        renal cancer went to his lungs   Cancer Paternal Grandmother        Breast   Colon cancer Neg Hx    Thyroid cancer Neg Hx     Social History:  Social History   Socioeconomic History   Marital status: Married    Spouse name: Not on file   Number of children: Not on file   Years of education: Not on file   Highest education level: Not on file  Occupational History   Not on file  Tobacco Use   Smoking status: Never   Smokeless tobacco: Never  Substance and Sexual Activity   Alcohol use: No    Comment: Maybe once a month   Drug use: No   Sexual activity: Yes  Other Topics Concern   Not on file  Social History Narrative   Lives in [place] with husband      Work - home      Two children- 4 years, 1 years       Diet - regular, followed bariatric clinic   Social Determinants of Health   Financial Resource Strain: Not on file  Food Insecurity: Not on file  Transportation Needs: Not on file  Physical Activity: Not on file  Stress: Not on file  Social Connections: Not on file    Allergies:  Allergies  Allergen Reactions   Advil [Ibuprofen] Swelling    Swelling of face and hands   Derma Guard [Protective Adhesive Powder]     Dermabond - urticaria    Metabolic Disorder Labs: Lab Results  Component Value Date   HGBA1C 5.0 02/06/2022   No results found for: "PROLACTIN" Lab Results  Component Value Date   CHOL 203 (H) 04/11/2021   TRIG 170.0 (H) 04/11/2021   HDL 52.40 04/11/2021   CHOLHDL 4 04/11/2021   VLDL 34.0 04/11/2021   LDLCALC 116 (H) 04/11/2021   LDLCALC 86 09/22/2014   Lab Results  Component Value Date   TSH 2.22 04/11/2021   TSH 2.18 02/04/2015    Therapeutic Level Labs: No results found for: "LITHIUM" No results found for: "VALPROATE" No results found for: "CBMZ"  Current Medications: Current Outpatient Medications  Medication Sig Dispense Refill   escitalopram (LEXAPRO) 10 MG tablet 5 mg daily for one week, then 10 mg daily 30 tablet 1   propranolol ER (INDERAL LA) 60 MG 24 hr capsule Take 1 capsule (60 mg total) by mouth daily. 90 capsule 3   tirzepatide (MOUNJARO) 10 MG/0.5ML Pen Inject 10 mg into the skin once a week. 6 mL 0   FLUoxetine (PROZAC) 40 MG capsule Take 1 capsule (40 mg total) by mouth daily. (Patient not taking: Reported on 05/22/2022) 90 capsule 3   traZODone (DESYREL) 50 MG tablet TAKE 1/2 TO 1 TABLET (25 TO 50 MG TOTAL) BY MOUTH AT BEDTIME (Patient not taking: Reported on 05/22/2022) 90 tablet 2   No current facility-administered medications for this visit.     Musculoskeletal: Strength & Muscle Tone: within normal limits Gait & Station: normal Patient leans: N/A  Psychiatric Specialty Exam: Review of Systems   Psychiatric/Behavioral:  Positive for decreased concentration and sleep disturbance. Negative for agitation, behavioral problems, confusion, dysphoric mood, hallucinations, self-injury and suicidal ideas. The patient is not nervous/anxious and is not hyperactive.   All other systems reviewed and are negative.   Blood pressure 104/60, pulse (!) 105, temperature 99.1  F (37.3 C), temperature source Oral, height 5\' 3"  (1.6 m), weight 192 lb 12.8 oz (87.5 kg).Body mass index is 34.15 kg/m.  General Appearance: Fairly Groomed  Eye Contact:  Good  Speech:  Clear and Coherent  Volume:  Normal  Mood:   overwhelmed  Affect:  Appropriate, Congruent, and slightly tense  Thought Process:  Coherent  Orientation:  Full (Time, Place, and Person)  Thought Content: Logical   Suicidal Thoughts:  No  Homicidal Thoughts:  No  Memory:  Immediate;   Good  Judgement:  Good  Insight:  Good  Psychomotor Activity:  Normal  Concentration:  Concentration: Good and Attention Span: Good  Recall:  Good  Fund of Knowledge: Good  Language: Good  Akathisia:  No  Handed:  Right  AIMS (if indicated): not done  Assets:  Communication Skills Desire for Improvement  ADL's:  Intact  Cognition: WNL  Sleep:  Fair   Screenings: GAD-7    Flowsheet Row Office Visit from 05/22/2022 in [facility] Office Visit from 02/06/2022 in [facility] Office Visit from 06/27/2021 in [facility] [place]  Total GAD-7 Score 4 3 5       PHQ2-9    Flowsheet Row Office Visit from 05/22/2022 in [facility] Office Visit from 02/06/2022 in [facility] Office Visit from 06/27/2021 in [facility] Office Visit from 04/04/2021 in [facility] Video Visit from 12/23/2020 in [facility]   PHQ-2 Total Score 2 3 2 6  0  PHQ-9 Total Score 12 6 10 18  --      Flowsheet Row Office Visit  from 05/22/2022 in [facility]  C-SSRS RISK CATEGORY Error: Q3, 4, or 5 should not be populated when Q2 is No        Assessment and Plan:  Sue Hernandez is a 41 y.o. year old female with a history of  depression, anxiety, migraine, , who presents for follow up appointment for below.  1. MDD (major depressive disorder), recurrent episode, moderate (HCC) She reports worsening in the irritability, and continues to experience anhedonia despite switching from fluoxetine to Lexapro.  Psychosocial stressors includes taking care of her 2 children. Noted that her irritability has been worsening in the setting of discontinuation of OCP.  We do further uptitration of Lexapro to optimize treatment for depression.  She was advised to contact the office if any further worsening in her mood symptoms after this medication change. She will greatly benefit from CBT; she will continue to see a therapist.    2. Other specified eating disorder Slight worsening since taking Monjaro every other week. She reports history of bulimia, binge eating, although she denies history of anorexia.  She is planning to take lower the dose to get injection every week. Will continue to assess this.   Plan Increase Lexapro 20 mg daily  Next appointment: 12/21 at 10:30 for 30 mins, in person    The patient demonstrates the following risk factors for suicide: Chronic risk factors for suicide include: psychiatric disorder of depression, anxiety . Acute risk factors for suicide include: unemployment. Protective factors for this patient include: positive social support, responsibility to others (children, family), coping skills, and hope for the future. Considering these factors, the overall suicide risk at this point appears to be low. Patient is appropriate for outpatient follow up.     Past trials of medication: lexapro, bupropion, fluoxetine    Collaboration of Care:  Collaboration of Care: Other  N/A  Patient/Guardian was advised Release of Information must be obtained prior to any record release in order to collaborate their care with an outside provider. Patient/Guardian was advised if they have not already done so to contact the registration department to sign all necessary forms in order for [place] to release information regarding their care.   Consent: Patient/Guardian gives verbal consent for treatment and assignment of benefits for services provided during this visit. Patient/Guardian expressed understanding and agreed to proceed.    Neysa Hotter, MD 05/22/2022, 9:31 AM

## 2022-05-22 ENCOUNTER — Ambulatory Visit (INDEPENDENT_AMBULATORY_CARE_PROVIDER_SITE_OTHER): Payer: Medicaid Other | Admitting: Psychiatry

## 2022-05-22 ENCOUNTER — Encounter: Payer: Self-pay | Admitting: Psychiatry

## 2022-05-22 VITALS — BP 104/60 | HR 105 | Temp 99.1°F | Ht 63.0 in | Wt 192.8 lb

## 2022-05-22 DIAGNOSIS — F331 Major depressive disorder, recurrent, moderate: Secondary | ICD-10-CM | POA: Diagnosis not present

## 2022-05-22 DIAGNOSIS — F5089 Other specified eating disorder: Secondary | ICD-10-CM

## 2022-05-22 MED ORDER — ESCITALOPRAM OXALATE 20 MG PO TABS: 20.0000 mg | ORAL_TABLET | Freq: Every day | ORAL | 1 refills | Status: DC

## 2022-05-22 NOTE — Patient Instructions (Addendum)
Increase Lexapro 20 mg daily  Next appointment: 12/21 at 10:30

## 2022-06-13 ENCOUNTER — Other Ambulatory Visit: Payer: Self-pay | Admitting: Psychiatry

## 2022-06-25 ENCOUNTER — Encounter: Payer: Self-pay | Admitting: Family

## 2022-07-10 ENCOUNTER — Ambulatory Visit: Payer: Medicaid Other | Admitting: Family

## 2022-07-12 NOTE — Progress Notes (Unsigned)
[place] MD/PA/NP OP Progress Note  07/13/2022 11:01 AM Sue Hernandez  MRN:  [id number]  Chief Complaint:  Chief Complaint  Patient presents with   Follow-up   HPI:  This is a follow-up appointment for depression.  She states that she is feeling better.  She is not angry as much since uptitration of Lexapro.  She has been stressed due to financial strain.  There was a moment of her becoming tearful due to the issue, although it has been improving.  Her husband is doing Environmental manager.  She thinks she now has ability to cope with them.  She is trying to work on self compassion/self-cares through therapy.  She signed up for things at her children's school.  She is hoping to get gathering with her female friends. The patient has mood symptoms as in PHQ-9/GAD-7.  She denies SI.  She wishes to have more energy/motivation, and would like to start bupropion.  She continues to do binge eating especially since not taking Mounjaro due to financial strain.  She denies any migraine since discontinuation of contraceptive.  She is hoping to taper off propranolol after discussing with her provider.    Wt Readings from Last 3 Encounters:  07/13/22 196 lb (88.9 kg)  05/22/22 192 lb 12.8 oz (87.5 kg)  02/06/22 194 lb (88 kg)     Support: husband Household: husband, 2 children Marital status: married Number of children: 2 (2 and 6 yo) Employment: unemployed, used to work as a Civil engineer, contracting:   Last PCP / ongoing medical evaluation:      Visit Diagnosis:    ICD-10-CM   1. MDD (major depressive disorder), recurrent episode, mild ([place])  F33.0     2. 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 I have reviewed the history. No updates at this time.     Past Medical History:  Past Medical History:  Diagnosis Date   COVID-19    fall 2021   Depression    Dr. Jacqualyn Posey   Eating disorder    bulemia   Migraines      Past Surgical History:  Procedure Laterality Date   CESAREAN SECTION N/A 10/07/2016   Procedure: CESAREAN SECTION;  Surgeon: Benjaman Kindler, MD;  Location: [facility];  Service: Obstetrics;  Laterality: N/A;   CESAREAN SECTION N/A 02/24/2020   Procedure: CESAREAN SECTION;  Surgeon: Ouida Sills, Gwen Her, MD;  Location: [facility];  Service: Obstetrics;  Laterality: N/A;   CHOLECYSTECTOMY  2009   LAPAROSCOPIC GASTRIC BANDING  2009   Dr. Johny Drilling   LAPAROSCOPIC GASTRIC SLEEVE RESECTION  01/2013   TONSILLECTOMY  2006   UPPER GI ENDOSCOPY  2014   hiatial hernia, Dr. Darnell Level    Family Psychiatric History: Please see initial evaluation for full details. I have reviewed the history. No updates at this time.     Family History:  Family History  Problem Relation Age of Onset   Depression Mother    High Cholesterol Mother    Anorexia nervosa Mother    ADD / ADHD Brother    Anxiety disorder Brother    Cancer Maternal Grandfather        lung   Cancer Maternal Grandmother        lung   Heart disease Paternal Grandfather    Cancer Paternal Grandfather        renal cancer went to his lungs   Cancer Paternal Grandmother  Breast   Colon cancer Neg Hx    Thyroid cancer Neg Hx     Social History:  Social History   Socioeconomic History   Marital status: Married    Spouse name: Not on file   Number of children: Not on file   Years of education: Not on file   Highest education level: Not on file  Occupational History   Not on file  Tobacco Use   Smoking status: Never   Smokeless tobacco: Never  Substance and Sexual Activity   Alcohol use: No    Comment: Maybe once a month   Drug use: No   Sexual activity: Yes  Other Topics Concern   Not on file  Social History Narrative   Lives in [place] with husband      Work - home      Two children- 4 years, 1 years      Diet - regular, followed bariatric clinic   Social Determinants of Health   Financial Resource Strain: Not on  file  Food Insecurity: Not on file  Transportation Needs: Not on file  Physical Activity: Not on file  Stress: Not on file  Social Connections: Not on file    Allergies:  Allergies  Allergen Reactions   Advil [Ibuprofen] Swelling    Swelling of face and hands   Derma Guard [Protective Adhesive Powder]     Dermabond - urticaria    Metabolic Disorder Labs: Lab Results  Component Value Date   HGBA1C 5.0 02/06/2022   No results found for: "PROLACTIN" Lab Results  Component Value Date   CHOL 203 (H) 04/11/2021   TRIG 170.0 (H) 04/11/2021   HDL 52.40 04/11/2021   CHOLHDL 4 04/11/2021   VLDL 34.0 04/11/2021   LDLCALC 116 (H) 04/11/2021   LDLCALC 86 09/22/2014   Lab Results  Component Value Date   TSH 2.22 04/11/2021   TSH 2.18 02/04/2015    Therapeutic Level Labs: No results found for: "LITHIUM" No results found for: "VALPROATE" No results found for: "CBMZ"  Current Medications: Current Outpatient Medications  Medication Sig Dispense Refill   escitalopram (LEXAPRO) 10 MG tablet 5 mg daily for one week, then 10 mg daily 30 tablet 1   escitalopram (LEXAPRO) 20 MG tablet Take 1 tablet (20 mg total) by mouth daily. 30 tablet 1   FLUoxetine (PROZAC) 40 MG capsule Take 1 capsule (40 mg total) by mouth daily. 90 capsule 3   propranolol ER (INDERAL LA) 60 MG 24 hr capsule Take 1 capsule (60 mg total) by mouth daily. 90 capsule 3   tirzepatide (MOUNJARO) 10 MG/0.5ML Pen Inject 10 mg into the skin once a week. 6 mL 0   traZODone (DESYREL) 50 MG tablet TAKE 1/2 TO 1 TABLET (25 TO 50 MG TOTAL) BY MOUTH AT BEDTIME 90 tablet 2   No current facility-administered medications for this visit.     Musculoskeletal: Strength & Muscle Tone: normal Gait & Station:  normal Patient leans: N/A  Psychiatric Specialty Exam: Review of Systems  Psychiatric/Behavioral:  Positive for dysphoric mood. Negative for agitation, behavioral problems, confusion, decreased concentration,  hallucinations, self-injury, sleep disturbance and suicidal ideas. The patient is nervous/anxious. The patient is not hyperactive.   All other systems reviewed and are negative.   Blood pressure 103/71, pulse 83, temperature (!) 97.2 F (36.2 C), temperature source Oral, height 5\' 3"  (1.6 m), weight 196 lb (88.9 kg).Body mass index is 34.72 kg/m.  General Appearance: Fairly Groomed  Eye Contact:  Good  Speech:  Clear and Coherent  Volume:  Normal  Mood:   better  Affect:  Appropriate, Congruent, and calm  Thought Process:  Coherent  Orientation:  Full (Time, Place, and Person)  Thought Content: Logical   Suicidal Thoughts:  No  Homicidal Thoughts:  No  Memory:  Immediate;   Good  Judgement:  Good  Insight:  Good  Psychomotor Activity:  Normal  Concentration:  Concentration: Good and Attention Span: Good  Recall:  Good  Fund of Knowledge: Good  Language: Good  Akathisia:  No  Handed:  Right  AIMS (if indicated): not done  Assets:  Communication Skills Desire for Improvement  ADL's:  Intact  Cognition: WNL  Sleep:  Fair   Screenings: GAD-7    Flowsheet Row Office Visit from 07/13/2022 in [place] Visit from 05/22/2022 in [place] Office Visit from 02/06/2022 in [place] Office Visit from 06/27/2021 in [place]  Total GAD-7 Score 2 4 3 5       PHQ2-9    [place] Office Visit from 07/13/2022 in [place] Office Visit from 05/22/2022 in [place] Office Visit from 02/06/2022 in [place] Office Visit from 06/27/2021 in [place] Office Visit from 04/04/2021 in [place]  PHQ-2 Total Score 2 2 3 2 6   PHQ-9 Total Score 6 12 6 10 18       [place] Office Visit from 07/13/2022 in [place] Office Visit  from 05/22/2022 in [place] Error: Question 2 not populated Error: Q3, 4, or 5 should not be populated when Q2 is No        Assessment and Plan:  DEETTE DULWORTH is a 41 y.o. year old female with a history of depression, anxiety, migraine, who presents for follow up appointment for below.   1. MDD (major depressive disorder), recurrent episode, mild (HCC) There has been improvement in irritability, depressive symptoms since uptitration of Lexapro. Psychosocial stressors includes taking care of her 2 children, financial strain in relation to her husband's business of marketing.  Will add bupropion as adjunctive treatment for depression.  She has no known history of seizure.  Discussed potential risk of headache, palpitation.  Will continue Lexapro to target depression. She will greatly benefit from CBT; she will continue to see a therapist.   2. Other specified eating disorder Slightly worsening in the setting of discontinuation on Mounjaro due to financial strain. She reports history of bulimia, binge eating, although she denies history of anorexia. She is hoping to Zepbound next month. Will continue to assess.     Plan Continue Lexapro 20 mg daily  Start bupropion 150 mg daily  Next appointment: 2/15 at 11:30 for 30 mins, in person    The patient demonstrates the following risk factors for suicide: Chronic risk factors for suicide include: psychiatric disorder of depression, anxiety . Acute risk factors for suicide include: unemployment. Protective factors for this patient include: positive social support, responsibility to others (children, family), coping skills, and hope for the future. Considering these factors, the overall suicide risk at this point appears to be low. Patient is appropriate for outpatient follow up.      Past trials of medication: lexapro, bupropion, fluoxetine  Collaboration of Care: Collaboration of Care: Other  reviewed notes in Epic  Patient/Guardian was advised Release of Information must be  obtained prior to any record release in order to collaborate their care with an outside provider. Patient/Guardian was advised if they have not already done so to contact the registration department to sign all necessary forms in order for [place] to release information regarding their care.   Consent: Patient/Guardian gives verbal consent for treatment and assignment of benefits for services provided during this visit. Patient/Guardian expressed understanding and agreed to proceed.    Sue Hotter, MD 07/13/2022, 11:01 AM

## 2022-07-13 ENCOUNTER — Ambulatory Visit (INDEPENDENT_AMBULATORY_CARE_PROVIDER_SITE_OTHER): Payer: Medicaid Other | Admitting: Psychiatry

## 2022-07-13 ENCOUNTER — Encounter: Payer: Self-pay | Admitting: Psychiatry

## 2022-07-13 VITALS — BP 103/71 | HR 83 | Temp 97.2°F | Ht 63.0 in | Wt 196.0 lb

## 2022-07-13 DIAGNOSIS — F5089 Other specified eating disorder: Secondary | ICD-10-CM | POA: Diagnosis not present

## 2022-07-13 DIAGNOSIS — F330 Major depressive disorder, recurrent, mild: Secondary | ICD-10-CM

## 2022-07-13 NOTE — Patient Instructions (Signed)
Continue Lexapro 20 mg daily  Start bupropion 150 mg daily  Next appointment: 2/15 at 11:30

## 2022-07-22 ENCOUNTER — Other Ambulatory Visit: Payer: Self-pay | Admitting: Psychiatry

## 2022-07-24 ENCOUNTER — Telehealth: Payer: Self-pay | Admitting: Psychiatry

## 2022-07-24 MED ORDER — ESCITALOPRAM OXALATE 20 MG PO TABS: 20.0000 mg | ORAL_TABLET | Freq: Every day | ORAL | 1 refills | Status: DC

## 2022-07-24 MED ORDER — BUPROPION HCL ER (XL) 150 MG PO TB24: 150.0000 mg | ORAL_TABLET | Freq: Every day | ORAL | 1 refills | Status: DC

## 2022-07-24 NOTE — Telephone Encounter (Signed)
Upon reviewing her note for refill of Lexapro, I recognized that she sent [place] a chat message about bupropion in Epic- although I have not received this message yet. I ordered bupropion to the pharmacy as well. Could you notify the patient about this? Thanks.

## 2022-07-25 NOTE — Telephone Encounter (Signed)
Call to make patient aware of Rx sent to pharmacy no answer lvm for patient

## 2022-07-31 ENCOUNTER — Ambulatory Visit (INDEPENDENT_AMBULATORY_CARE_PROVIDER_SITE_OTHER): Payer: Medicaid Other | Admitting: Family

## 2022-07-31 ENCOUNTER — Encounter: Payer: Self-pay | Admitting: Family

## 2022-07-31 ENCOUNTER — Other Ambulatory Visit: Payer: Self-pay | Admitting: Family

## 2022-07-31 VITALS — BP 100/80 | HR 84 | Temp 98.2°F | Ht 63.0 in | Wt 193.8 lb

## 2022-07-31 DIAGNOSIS — F419 Anxiety disorder, unspecified: Secondary | ICD-10-CM | POA: Diagnosis not present

## 2022-07-31 DIAGNOSIS — Z8639 Personal history of other endocrine, nutritional and metabolic disease: Secondary | ICD-10-CM | POA: Diagnosis not present

## 2022-07-31 DIAGNOSIS — D649 Anemia, unspecified: Secondary | ICD-10-CM

## 2022-07-31 DIAGNOSIS — F32A Depression, unspecified: Secondary | ICD-10-CM

## 2022-07-31 DIAGNOSIS — E538 Deficiency of other specified B group vitamins: Secondary | ICD-10-CM | POA: Diagnosis not present

## 2022-07-31 DIAGNOSIS — G43909 Migraine, unspecified, not intractable, without status migrainosus: Secondary | ICD-10-CM

## 2022-07-31 DIAGNOSIS — E669 Obesity, unspecified: Secondary | ICD-10-CM

## 2022-07-31 LAB — CBC WITH DIFFERENTIAL/PLATELET
Basophils Absolute: 0 10*3/uL (ref 0.0–0.1)
Basophils Relative: 0.3 % (ref 0.0–3.0)
Eosinophils Absolute: 0.2 10*3/uL (ref 0.0–0.7)
Eosinophils Relative: 2.5 % (ref 0.0–5.0)
HCT: 39.7 % (ref 36.0–46.0)
Hemoglobin: 12.8 g/dL (ref 12.0–15.0)
Lymphocytes Relative: 28.9 % (ref 12.0–46.0)
Lymphs Abs: 2.1 10*3/uL (ref 0.7–4.0)
MCHC: 32.2 g/dL (ref 30.0–36.0)
MCV: 79.2 fl (ref 78.0–100.0)
Monocytes Absolute: 0.5 10*3/uL (ref 0.1–1.0)
Monocytes Relative: 7.5 % (ref 3.0–12.0)
Neutro Abs: 4.5 10*3/uL (ref 1.4–7.7)
Neutrophils Relative %: 60.8 % (ref 43.0–77.0)
Platelets: 346 10*3/uL (ref 150.0–400.0)
RBC: 5.02 Mil/uL (ref 3.87–5.11)
RDW: 14.6 % (ref 11.5–15.5)
WBC: 7.3 10*3/uL (ref 4.0–10.5)

## 2022-07-31 LAB — LIPID PANEL
Cholesterol: 194 mg/dL (ref 0–200)
HDL: 44.8 mg/dL (ref >39.00)
NonHDL: 148.98
Total CHOL/HDL Ratio: 4
Triglycerides: 218 mg/dL — ABNORMAL HIGH (ref 0.0–149.0)
VLDL: 43.6 mg/dL — ABNORMAL HIGH (ref 0.0–40.0)

## 2022-07-31 LAB — COMPREHENSIVE METABOLIC PANEL
ALT: 19 U/L (ref 0–35)
AST: 13 U/L (ref 0–37)
Albumin: 4.1 g/dL (ref 3.5–5.2)
Alkaline Phosphatase: 54 U/L (ref 39–117)
BUN: 13 mg/dL (ref 6–23)
CO2: 27 mEq/L (ref 19–32)
Calcium: 9.5 mg/dL (ref 8.4–10.5)
Chloride: 105 mEq/L (ref 96–112)
Creatinine, Ser: 0.69 mg/dL (ref 0.40–1.20)
GFR: 108.61 mL/min (ref >60.00)
Glucose, Bld: 69 mg/dL — ABNORMAL LOW (ref 70–99)
Potassium: 4.3 mEq/L (ref 3.5–5.1)
Sodium: 140 mEq/L (ref 135–145)
Total Bilirubin: 0.4 mg/dL (ref 0.2–1.2)
Total Protein: 6.9 g/dL (ref 6.0–8.3)

## 2022-07-31 LAB — VITAMIN D 25 HYDROXY (VIT D DEFICIENCY, FRACTURES): VITD: 28.47 ng/mL — ABNORMAL LOW (ref 30.00–100.00)

## 2022-07-31 LAB — HEMOGLOBIN A1C: Hgb A1c MFr Bld: 5.8 % (ref 4.6–6.5)

## 2022-07-31 LAB — B12 AND FOLATE PANEL
Folate: 9.6 ng/mL (ref >5.9)
Vitamin B-12: 386 pg/mL (ref 211–911)

## 2022-07-31 LAB — LDL CHOLESTEROL, DIRECT: Direct LDL: 128 mg/dL

## 2022-07-31 MED ORDER — ZEPBOUND 7.5 MG/0.5ML ~~LOC~~ SOAJ: 7.5000 mg | SUBCUTANEOUS | 2 refills | Status: DC

## 2022-07-31 NOTE — Assessment & Plan Note (Signed)
Patient is established with Dr. Modesta Messing who felt comfortable restarting Wellbutrin in setting of h/o bulimia.  Patient is compliant with Wellbutrin 150 mg QD, lexapro 20mg  QD.

## 2022-07-31 NOTE — Progress Notes (Signed)
Assessment & Plan:  History of vitamin D deficiency -     VITAMIN D 25 Hydroxy (Vit-D Deficiency, Fractures)  Anemia, unspecified type -     CBC with Differential/Platelet -     Comprehensive metabolic panel -     B12 and Folate Panel -     Iron, TIBC and Ferritin Panel  Obesity (BMI 30-39.9) Assessment & Plan: BMI 34 H/o preDM.  We will stop Mounjaro 7.5 mg and start Zepbound 7.5 mg due to cost.  Orders: -     Comprehensive metabolic panel -     Zepbound; Inject 7.5 mg into the skin once a week.  Dispense: 2 mL; Refill: 2 -     Hemoglobin A1c -     Lipid panel  B12 deficiency -     B12 and Folate Panel  Migraine without status migrainosus, not intractable, unspecified migraine type Assessment & Plan: Headaches have resolved since stopping OCP.  There is no longer dose of propranolol ER 60mg . Advised to stop propranolol ER 60mg  QD and monitor for rebound tachycardia after consulting with pharmacist .   Anxiety and depression Assessment & Plan: Patient is established with Dr. who felt comfortable restarting Wellbutrin in setting of h/o bulimia.  Patient is compliant with Wellbutrin 150 mg QD, lexapro 20mg  QD.        Return precautions given.   Risks, benefits, and alternatives of the medications and treatment plan prescribed today were discussed, and patient expressed understanding.   Education regarding symptom management and diagnosis given to patient on AVS either electronically or printed.  Return in about 4 months (around 11/29/2022).  Sue Shawl, FNP  Subjective:    Patient ID: , female    DOB: 06-19-1981, 42 y.o.   MRN: Sue Hernandez  CC: Sue Hernandez is a 42 y.o. female who presents today for follow up.   HPI: She is taking mounjaro 7.5mg  and paying $500/month.  She is tolerating this dose She has been very pleased medication however would like to change to Zepbound due to cost.  Since stopping OCP, she has  not had a headache.  She would like to wean off of propranolol.  Husband plans to get a vasectomy    Compliant with propranolol 60 mg daily for migraine prophylaxis Patient is following with psychiatry, Dr. [phone].  She is compliant Wellbutrin 150 mg daily, Lexapro 20 mg daily.  She is very pleased with regimen  Reports a history of vitamin D being low  H/o anemia   Allergies: Advil [ibuprofen] and Derma guard [protective adhesive powder] Current Outpatient Medications on File Prior to Visit  Medication Sig Dispense Refill   buPROPion (WELLBUTRIN XL) 150 MG 24 hr tablet Take 1 tablet (150 mg total) by mouth daily. 30 tablet 1   escitalopram (LEXAPRO) 20 MG tablet Take 1 tablet (20 mg total) by mouth daily. 30 tablet 1   No current facility-administered medications on file prior to visit.    Review of Systems  Constitutional:  Negative for chills and fever.  Respiratory:  Negative for cough.   Cardiovascular:  Negative for chest pain and palpitations.  Gastrointestinal:  Negative for nausea and vomiting.  Neurological:  Negative for headaches (resolved).      Objective:    BP 100/80   Pulse 84   Temp 98.2 F (36.8 C)   Ht 5\' 3"  (1.6 m)   Wt 193 lb 12.8 oz (87.9 kg)   SpO2 98%   BMI  34.33 kg/m  BP Readings from Last 3 Encounters:  07/31/22 100/80  02/06/22 118/70  11/03/21 120/62   Wt Readings from Last 3 Encounters:  07/31/22 193 lb 12.8 oz (87.9 kg)  02/06/22 194 lb (88 kg)  11/03/21 211 lb 9.6 oz (96 kg)    Physical Exam Vitals reviewed.  Constitutional:      Appearance: She is well-developed.  Eyes:     Conjunctiva/sclera: Conjunctivae normal.  Cardiovascular:     Rate and Rhythm: Normal rate and regular rhythm.     Pulses: Normal pulses.     Heart sounds: Normal heart sounds.  Pulmonary:     Effort: Pulmonary effort is normal.     Breath sounds: Normal breath sounds. No wheezing, rhonchi or rales.  Skin:    General: Skin is warm and dry.   Neurological:     Mental Status: She is alert.  Psychiatric:        Speech: Speech normal.        Behavior: Behavior normal.        Thought Content: Thought content normal.

## 2022-07-31 NOTE — Assessment & Plan Note (Signed)
BMI 34 H/o preDM.  We will stop Mounjaro 7.5 mg and start Zepbound 7.5 mg due to cost.

## 2022-07-31 NOTE — Assessment & Plan Note (Addendum)
Headaches have resolved since stopping OCP.  There is no longer dose of propranolol ER 60mg . Advised to stop propranolol ER 60mg  QD and monitor for rebound tachycardia after consulting with pharmacist Kaylyn Layer.

## 2022-08-01 LAB — IRON,TIBC AND FERRITIN PANEL
%SAT: 21 % (calc) (ref 16–45)
Ferritin: 10 ng/mL — ABNORMAL LOW (ref 16–154)
Iron: 84 ug/dL (ref 40–190)
TIBC: 402 mcg/dL (calc) (ref 250–450)

## 2022-08-02 ENCOUNTER — Other Ambulatory Visit: Payer: Self-pay | Admitting: Family

## 2022-08-02 ENCOUNTER — Telehealth: Payer: Self-pay

## 2022-08-02 ENCOUNTER — Other Ambulatory Visit (HOSPITAL_COMMUNITY): Payer: Self-pay

## 2022-08-02 DIAGNOSIS — E669 Obesity, unspecified: Secondary | ICD-10-CM

## 2022-08-02 MED ORDER — ZEPBOUND 7.5 MG/0.5ML ~~LOC~~ SOAJ: 7.5000 mg | SUBCUTANEOUS | 1 refills | Status: DC

## 2022-08-02 NOTE — Telephone Encounter (Signed)
Prior authorization submitted via CMM and documented in separate encounter.

## 2022-08-02 NOTE — Telephone Encounter (Signed)
Patient Advocate Encounter   Received notification from [place] that prior authorization for ZEPBOUND 7.5MG /0.5ML is required.   PA submitted on 08/02/2022 Key [id number] Status is pending

## 2022-08-03 NOTE — Telephone Encounter (Signed)
Pharmacy Patient Advocate Encounter  Received notification from [facility] Medicaid that the request for prior authorization for Zepbound Inj has been denied due to .

## 2022-08-03 NOTE — Telephone Encounter (Signed)
LVM to call back.

## 2022-08-09 NOTE — Telephone Encounter (Signed)
LVM to call back but also sent message via Mychart also

## 2022-08-10 ENCOUNTER — Telehealth: Payer: Self-pay

## 2022-08-10 NOTE — Telephone Encounter (Signed)
Spoke to pt and informed her that the PA was submitted for Zepbound and as soon as we hear back then we will contact her

## 2022-08-10 NOTE — Telephone Encounter (Signed)
LVM to call back to go over results 

## 2022-08-20 ENCOUNTER — Encounter: Payer: Self-pay | Admitting: Family

## 2022-08-21 ENCOUNTER — Other Ambulatory Visit: Payer: Self-pay | Admitting: Family

## 2022-08-21 ENCOUNTER — Telehealth: Payer: Self-pay

## 2022-08-21 ENCOUNTER — Other Ambulatory Visit: Payer: Self-pay

## 2022-08-21 DIAGNOSIS — E669 Obesity, unspecified: Secondary | ICD-10-CM

## 2022-08-21 MED ORDER — ZEPBOUND 7.5 MG/0.5ML ~~LOC~~ SOAJ: 7.5000 mg | SUBCUTANEOUS | 3 refills | Status: DC

## 2022-08-21 NOTE — Telephone Encounter (Signed)
pt called back states that she missed read the bottle she does have a refill left.

## 2022-08-21 NOTE — Telephone Encounter (Signed)
Refill sent into pharmacy pt is aware

## 2022-08-21 NOTE — Telephone Encounter (Signed)
pt called states she needs refill on the wellbutrin that she will see Dr. Modesta Messing in feb. and she needs enough to get to appt.

## 2022-09-05 NOTE — Progress Notes (Unsigned)
[facility] MD/PA/NP OP Progress Note  09/07/2022 11:58 AM Sue Hernandez  MRN:  [id number]  Chief Complaint:  Chief Complaint  Patient presents with   Follow-up   HPI:  This is a follow-up appointment for depression.  She states that she is not doing well.  She has financial issues.  She is not on Mounjaro anymore for the past few weeks due to issues with insurance.  She has tooth pain for several days.  She feels heavy.  She also has marital conflict with her husband, who makes poor choices in finances.  She thinks it is a Sport and exercise psychologist.  She cried 10 hours the previous day, which is not normal for her.  She sleeps 8 hours.  She is unable to enjoy anything.  She denies SI. The patient has mood symptoms as in PHQ-9/GAD-7. She denies SI. She craves for food when she wakes up in the morning, and has binge eating.  She denies any alcohol use or drug use.  She denies any side effect from bupropion, and is willing to try higher dose.    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 MDD (major depressive disorder), recurrent episode, moderate ([place])  F33.1       Past Psychiatric History: Please see initial evaluation for full details. I have reviewed the history. No updates at this time.     Past Medical History:  Past Medical History:  Diagnosis Date   COVID-19    fall 2021   Depression    Dr. Jacqualyn Posey   Eating disorder    bulemia   Migraines     Past Surgical History:  Procedure Laterality Date   CESAREAN SECTION N/A 10/07/2016   Procedure: CESAREAN SECTION;  Surgeon: Benjaman Kindler, MD;  Location: [facility] ORS;  Service: Obstetrics;   Laterality: N/A;   CESAREAN SECTION N/A 02/24/2020   Procedure: CESAREAN SECTION;  Surgeon: Ouida Sills, Gwen Her, MD;  Location: [facility] ORS;  Service: Obstetrics;  Laterality: N/A;   CHOLECYSTECTOMY  2009   LAPAROSCOPIC GASTRIC BANDING  2009   Dr. Johny Drilling   LAPAROSCOPIC GASTRIC SLEEVE RESECTION  01/2013   TONSILLECTOMY  2006   UPPER GI ENDOSCOPY  2014   hiatial hernia, Dr. Darnell Level    Family Psychiatric History: Please see initial evaluation for full details. I have reviewed the history. No updates at this time.     Family History:  Family History  Problem Relation Age of Onset   Depression Mother    High Cholesterol Mother    Anorexia nervosa Mother    Hypertension Mother    ADD / ADHD Brother    Anxiety disorder Brother    Cancer Maternal Grandmother        lung   Cancer Maternal Grandfather        lung   Cancer Paternal Grandmother        Breast   Heart disease Paternal Grandfather    Cancer Paternal Grandfather        renal cancer went to his lungs   Colon cancer Neg Hx    Thyroid  cancer Neg Hx     Social History:  Social History   Socioeconomic History   Marital status: Married    Spouse name: Not on file   Number of children: Not on file   Years of education: Not on file   Highest education level: Not on file  Occupational History   Not on file  Tobacco Use   Smoking status: Never   Smokeless tobacco: Never  Substance and Sexual Activity   Alcohol use: No    Comment: Maybe once a month   Drug use: No   Sexual activity: Yes  Other Topics Concern   Not on file  Social History Narrative   Lives in [place] with husband      Work - home      Two children- 4 years, 1 years      Diet - regular, followed bariatric clinic   Social Determinants of Health   Financial Resource Strain: Not on file  Food Insecurity: Not on file  Transportation Needs: Not on file  Physical Activity: Not on file  Stress: Not on file  Social Connections: Not on file     Allergies:  Allergies  Allergen Reactions   Advil [Ibuprofen] Swelling    Swelling of face and hands   Derma Guard [Protective Adhesive Powder]     Dermabond - urticaria    Metabolic Disorder Labs: Lab Results  Component Value Date   HGBA1C 5.8 07/31/2022   No results found for: "PROLACTIN" Lab Results  Component Value Date   CHOL 194 07/31/2022   TRIG 218.0 (H) 07/31/2022   HDL 44.80 07/31/2022   CHOLHDL 4 07/31/2022   VLDL 43.6 (H) 07/31/2022   LDLCALC 116 (H) 04/11/2021   LDLCALC 86 09/22/2014   Lab Results  Component Value Date   TSH 2.22 04/11/2021   TSH 2.18 02/04/2015    Therapeutic Level Labs: No results found for: "LITHIUM" No results found for: "VALPROATE" No results found for: "CBMZ"  Current Medications: Current Outpatient Medications  Medication Sig Dispense Refill   [START ON 09/14/2022] buPROPion (WELLBUTRIN XL) 300 MG 24 hr tablet Take 1 tablet (300 mg total) by mouth daily. 30 tablet 1   [START ON 09/22/2022] escitalopram (LEXAPRO) 20 MG tablet Take 1 tablet (20 mg total) by mouth daily. 30 tablet 1   tirzepatide (ZEPBOUND) 7.5 MG/0.5ML Pen Inject 7.5 mg into the skin once a week. (Patient not taking: Reported on 09/07/2022) 2 mL 3   No current facility-administered medications for this visit.     Musculoskeletal: Strength & Muscle Tone: within normal limits Gait & Station: normal Patient leans: N/A  Psychiatric Specialty Exam: Review of Systems  Psychiatric/Behavioral:  Positive for dysphoric mood. Negative for agitation, behavioral problems, confusion, decreased concentration, hallucinations, self-injury, sleep disturbance and suicidal ideas. The patient is nervous/anxious. The patient is not hyperactive.   All other systems reviewed and are negative.   Blood pressure 121/79, pulse 86, temperature 98.1 F (36.7 C), temperature source Skin, height 5' 3"$  (1.6 m), weight 199 lb 6.4 oz (90.4 kg).Body mass index is 35.32 kg/m.  General  Appearance: Fairly Groomed  Eye Contact:  Good  Speech:  Clear and Coherent  Volume:  Normal  Mood:   not good  Affect:  Appropriate, Congruent, and Full Range  Thought Process:  Coherent  Orientation:  Full (Time, Place, and Person)  Thought Content: Logical   Suicidal Thoughts:  No  Homicidal Thoughts:  No  Memory:  Immediate;   Good  Judgement:  Good  Insight:  Good  Psychomotor Activity:  Normal  Concentration:  Concentration: Good and Attention Span: Good  Recall:  Good  Fund of Knowledge: Good  Language: Good  Akathisia:  No  Handed:  Right  AIMS (if indicated): not done  Assets:  Communication Skills Desire for Improvement  ADL's:  Intact  Cognition: WNL  Sleep:  Good   Screenings: GAD-7    Flowsheet Row Office Visit from 09/07/2022 in [place] Office Visit from 07/13/2022 in [place] Office Visit from 05/22/2022 in [place] Office Visit from 02/06/2022 in [place] at [facility] Visit from 06/27/2021 in [place] at [facility]  Total GAD-7 Score 5 2 4 3 5      $ [place] Office Visit from 09/07/2022 in [place] Office Visit from 07/13/2022 in [place] Office Visit from 05/22/2022 in [place] Office Visit from 02/06/2022 in [place] at [facility] Visit from 06/27/2021 in [place] at [facility]  PHQ-2 Total Score 4 2 2 3 2  $ PHQ-9 Total Score 13 6 12 6 10      $ [place] Office Visit from 07/13/2022 in [place] Office Visit from 05/22/2022 in [place] Error:  Question 2 not populated Error: Q3, 4, or 5 should not be populated when Q2 is No        Assessment and Plan:  Sue Hernandez is a 42 y.o. year old female with a history of depression, anxiety, migraine, who presents for follow up appointment for below.   1. MDD (major depressive disorder), recurrent episode, moderate ([facility]) Acute stressors include: dental pain, unable to afford Mounjaro Other stressors include: financial strain, marital conflict    History:   There has been worsening in depressive symptoms and anxiety in the context of stressors as above.  We uptitrate bupropion to optimize treatment for depression.  She has no known history of seizure.  Will continue to ask approach to target depression.  She will greatly benefit from CBT; she will continue to see a therapist.    2. Other specified eating disorder Worsening in the setting of discontinuation on Mounjaro due to financial strain. She reports history of bulimia, binge eating, although she denies history of anorexia. Will continue to assess.     Plan Continue Lexapro 20 mg daily  Increase bupropion 300 mg daily  Next appointment: 4/15 at 11 AM for 30 mins, in person    The patient demonstrates the following risk factors for suicide: Chronic risk factors for suicide include: psychiatric disorder of depression, anxiety . Acute risk factors for suicide include: unemployment. Protective factors for this patient include: positive social support, responsibility to others (children, family), coping skills, and hope for the future. Considering these factors, the overall suicide risk at this point appears to be low. Patient is appropriate for outpatient follow up.     Collaboration of Care: Collaboration of Care: Other reviewed notes in Epic  Patient/Guardian was advised Release of Information must be obtained prior to any record release in order to collaborate their care with an outside provider. Patient/Guardian was advised if  they have not already done so to contact the registration department to sign  all necessary forms in order for [place] to release information regarding their care.   Consent: Patient/Guardian gives verbal consent for treatment and assignment of benefits for services provided during this visit. Patient/Guardian expressed understanding and agreed to proceed.    Norman Clay, MD 09/07/2022, 11:58 AM

## 2022-09-07 ENCOUNTER — Encounter: Payer: Self-pay | Admitting: Psychiatry

## 2022-09-07 ENCOUNTER — Ambulatory Visit (INDEPENDENT_AMBULATORY_CARE_PROVIDER_SITE_OTHER): Payer: Medicaid Other | Admitting: Psychiatry

## 2022-09-07 VITALS — BP 121/79 | HR 86 | Temp 98.1°F | Ht 63.0 in | Wt 199.4 lb

## 2022-09-07 DIAGNOSIS — F331 Major depressive disorder, recurrent, moderate: Secondary | ICD-10-CM | POA: Diagnosis not present

## 2022-09-07 MED ORDER — ESCITALOPRAM OXALATE 20 MG PO TABS: 20.0000 mg | ORAL_TABLET | Freq: Every day | ORAL | 1 refills | Status: DC

## 2022-09-07 MED ORDER — BUPROPION HCL ER (XL) 300 MG PO TB24: 300.0000 mg | ORAL_TABLET | Freq: Every day | ORAL | 1 refills | Status: DC

## 2022-09-07 NOTE — Patient Instructions (Signed)
Continue Lexapro 20 mg daily  Increase bupropion 300 mg daily  Next appointment: 4/15 at 11 AM

## 2022-09-12 ENCOUNTER — Other Ambulatory Visit (HOSPITAL_COMMUNITY): Payer: Self-pay

## 2022-09-12 NOTE — Telephone Encounter (Signed)
PA denied for Zepbound on previous encounter. No PA submitted at this time.

## 2022-09-28 ENCOUNTER — Encounter: Payer: Self-pay | Admitting: Family

## 2022-09-29 NOTE — Telephone Encounter (Signed)
Called patient and discussed I do not recommend compounding pharmacies for GLP-1 agonist due to safety concerns, lack of FDA regulation.  She verbalized understanding.

## 2022-10-16 ENCOUNTER — Encounter: Payer: Self-pay | Admitting: Family

## 2022-10-16 ENCOUNTER — Ambulatory Visit (INDEPENDENT_AMBULATORY_CARE_PROVIDER_SITE_OTHER): Payer: Medicaid Other | Admitting: Family

## 2022-10-16 VITALS — BP 126/78 | HR 89 | Temp 98.2°F | Ht 63.0 in | Wt 203.8 lb

## 2022-10-16 DIAGNOSIS — H109 Unspecified conjunctivitis: Secondary | ICD-10-CM | POA: Diagnosis not present

## 2022-10-16 DIAGNOSIS — E669 Obesity, unspecified: Secondary | ICD-10-CM | POA: Diagnosis not present

## 2022-10-16 MED ORDER — TIRZEPATIDE 2.5 MG/0.5ML ~~LOC~~ SOAJ: 2.5000 mg | SUBCUTANEOUS | 3 refills | Status: DC

## 2022-10-16 MED ORDER — ERYTHROMYCIN 5 MG/GM OP OINT: TOPICAL_OINTMENT | OPHTHALMIC | 0 refills | Status: DC

## 2022-10-16 NOTE — Patient Instructions (Signed)
Bacterial Conjunctivitis, Adult Bacterial conjunctivitis is an infection of the clear membrane that covers the white part of the eye and the inner surface of the eyelid (conjunctiva). When the blood vessels in the conjunctiva become inflamed, the eye becomes red or pink. The eye often feels irritated or itchy. Bacterial conjunctivitis spreads easily from person to person (is contagious). It also spreads easily from one eye to the other eye. What are the causes? This condition is caused by bacteria. You may get the infection if you come into close contact with: A person who is infected with the bacteria. Items that are contaminated with the bacteria, such as a face towel, contact lens solution, or eye makeup. What increases the risk? You are more likely to develop this condition if: You are exposed to other people who have the infection. You wear contact lenses. You have a sinus infection. You have had a recent eye injury or surgery. You have a weak body defense system (immune system). You have a medical condition that causes dry eyes. What are the signs or symptoms? Symptoms of this condition include: Thick, yellowish discharge from the eye. This may turn into a crust on the eyelid overnight and cause your eyelids to stick together. Tearing or watery eyes. Itchy eyes. Burning feeling in your eyes. Eye redness. Swollen eyelids. Blurred vision. How is this diagnosed? This condition is diagnosed based on your symptoms and medical history. Your health care provider may also take a sample of discharge from your eye to find the cause of your infection. How is this treated? This condition may be treated with: Antibiotic eye drops or ointment to clear the infection more quickly and prevent the spread of infection to others. Antibiotic medicines taken by mouth (orally) to treat infections that do not respond to drops or ointments or that last longer than 10 days. Cool, wet cloths (cool  compresses) placed on the eyes. Artificial tears applied 2-6 times a day. Follow these instructions at home: Medicines Take or apply your antibiotic medicine as told by your health care provider. Do not stop using the antibiotic, even if your condition improves, unless directed by your health care provider. Take or apply over-the-counter and prescription medicines only as told by your health care provider. Be very careful to avoid touching the edge of your eyelid with the eye-drop bottle or the ointment tube when you apply medicines to the affected eye. This will keep you from spreading the infection to your other eye or to other people. Managing discomfort Gently wipe away any drainage from your eye with a warm, wet washcloth or a cotton ball. Apply a clean, cool compress to your eye for 10-20 minutes, 3-4 times a day. General instructions Do not wear contact lenses until the inflammation is gone and your health care provider says it is safe to wear them again. Ask your health care provider how to sterilize or replace your contact lenses before you use them again. Wear glasses until you can resume wearing contact lenses. Avoid wearing eye makeup until the inflammation is gone. Throw away any old eye cosmetics that may be contaminated. Change or wash your pillowcase every day. Do not share towels or washcloths. This may spread the infection. Wash your hands often with soap and water for at least 20 seconds and especially before touching your face or eyes. Use paper towels to dry your hands. Avoid touching or rubbing your eyes. Do not drive or use heavy machinery if your vision is blurred. Contact   a health care provider if: You have a fever. Your symptoms do not get better after 10 days. Get help right away if: You have a fever and your symptoms suddenly get worse. You have severe pain when you move your eye. You have facial pain, redness, or swelling. You have a sudden loss of  vision. Summary Bacterial conjunctivitis is an infection of the clear membrane that covers the white part of the eye and the inner surface of the eyelid (conjunctiva). Bacterial conjunctivitis spreads easily from eye to eye and from person to person (is contagious). Wash your hands often with soap and water for at least 20 seconds and especially before touching your face or eyes. Use paper towels to dry your hands. Take or apply your antibiotic medicine as told by your health care provider. Do not stop using the antibiotic even if your condition improves. Contact a health care provider if you have a fever or if your symptoms do not get better after 10 days. Get help right away if you have a sudden loss of vision. This information is not intended to replace advice given to you by your health care provider. Make sure you discuss any questions you have with your health care provider. Document Revised: 10/20/2020 Document Reviewed: 10/20/2020 Elsevier Patient Education  2023 [employer]  

## 2022-10-16 NOTE — Progress Notes (Signed)
Assessment & Plan:  Conjunctivitis of left eye, unspecified conjunctivitis type Assessment & Plan: Exam consistent with conjunctivitis. discussed whether viral versus bacterial versus allergic conjunctivitis.  As she is been taking Polytrim at home for a few days prn, we discussed if incomplete  treatment has allowed for persistent symptoms. I have sent in erythromycin ointment for 7-day course.  Orders: -     Erythromycin; Use one half inch four times daily to affected eye (s) x 7 days.  Dispense: 3.5 g; Refill: 0  Obesity (BMI 30-39.9) -     Tirzepatide; Inject 2.5 mg into the skin once a week.  Dispense: 2 mL; Refill: 3     Return precautions given.   Risks, benefits, and alternatives of the medications and treatment plan prescribed today were discussed, and patient expressed understanding.   Education regarding symptom management and diagnosis given to patient on AVS either electronically or printed.  No follow-ups on file.  Sue Paris, FNP  Subjective:    Patient ID: Sue Hernandez, female    DOB: March 08, 1981, 42 y.o.   MRN: [id number]  CC: Sue Hernandez is a 42 y.o. female who presents today for an acute visit.    HPI: Complains of left crusty, red eye which have been intermittent. She woke up 3 days ago and eye was 'stuck' . Endorses gritty sensation.    She has been using polytrim for a couple of days with resolution.  She has changed eyeliner.  No contacts.  No vision loss, cough, sinus congestion, ear pain.   Children have had 'pink eye'.    She request a refill of Mounjaro which she is previously been on and done well on.  Darcel Bayley has has better coverage through her help   History of prediabetes  Allergies: Advil [ibuprofen] and Derma guard [protective adhesive powder] Current Outpatient Medications on File Prior to Visit  Medication Sig Dispense Refill   buPROPion (WELLBUTRIN XL) 300 MG 24 hr tablet Take 1 tablet (300 mg total) by mouth daily.  30 tablet 1   escitalopram (LEXAPRO) 20 MG tablet Take 1 tablet (20 mg total) by mouth daily. 30 tablet 1   No current facility-administered medications on file prior to visit.    Review of Systems  Constitutional:  Negative for chills and fever.  HENT:  Negative for congestion and sinus pressure.   Eyes:  Positive for discharge and redness. Negative for photophobia, pain and visual disturbance.  Respiratory:  Negative for cough.   Cardiovascular:  Negative for chest pain and palpitations.  Gastrointestinal:  Negative for nausea and vomiting.      Objective:    BP 126/78   Pulse 89   Temp 98.2 F (36.8 C) (Oral)   Ht 5\' 3"  (1.6 m)   Wt 203 lb 12.8 oz (92.4 kg)   LMP  (LMP Unknown)   SpO2 97%   BMI 36.10 kg/m   BP Readings from Last 3 Encounters:  10/16/22 126/78  07/31/22 100/80  02/06/22 118/70   Wt Readings from Last 3 Encounters:  10/16/22 203 lb 12.8 oz (92.4 kg)  07/31/22 193 lb 12.8 oz (87.9 kg)  02/06/22 194 lb (88 kg)    Physical Exam Vitals reviewed.  Constitutional:      Appearance: She is well-developed.  HENT:     Head: Normocephalic and atraumatic.     Right Ear: Hearing, tympanic membrane, ear canal and external ear normal. No decreased hearing noted. No drainage, swelling or tenderness. No middle  ear effusion. No foreign body. Tympanic membrane is not erythematous or bulging.     Left Ear: Hearing, tympanic membrane, ear canal and external ear normal. No decreased hearing noted. No drainage, swelling or tenderness.  No middle ear effusion. No foreign body. Tympanic membrane is not erythematous or bulging.     Nose: Nose normal. No rhinorrhea.     Right Sinus: No maxillary sinus tenderness or frontal sinus tenderness.     Left Sinus: No maxillary sinus tenderness or frontal sinus tenderness.     Mouth/Throat:     Pharynx: Uvula midline. No oropharyngeal exudate or posterior oropharyngeal erythema.     Tonsils: No tonsillar abscesses.  Eyes:      General: Lids are everted, no foreign bodies appreciated. No scleral icterus.       Right eye: No discharge.        Left eye: No discharge or hordeolum.     Conjunctiva/sclera:     Right eye: Right conjunctiva is not injected. No hemorrhage.    Left eye: Left conjunctiva is injected. No hemorrhage.    Pupils: Pupils are equal, round, and reactive to light.     Comments: No external eye lesions. Surrounding skin intact.   Left eye:   Diffuse injection of the conjunctiva. No white spots, opacity, or foreign body appreciated. No collection of blood or pus in the anterior chamber. No ciliary flush surrounding iris.   No photophobia or eye pain appreciated during exam.   Cardiovascular:     Rate and Rhythm: Regular rhythm.     Pulses: Normal pulses.     Heart sounds: Normal heart sounds.  Pulmonary:     Effort: Pulmonary effort is normal.     Breath sounds: Normal breath sounds. No wheezing, rhonchi or rales.  Lymphadenopathy:     Head:     Right side of head: No submental, submandibular, tonsillar, preauricular, posterior auricular or occipital adenopathy.     Left side of head: No submental, submandibular, tonsillar, preauricular, posterior auricular or occipital adenopathy.     Cervical: No cervical adenopathy.  Skin:    General: Skin is warm and dry.  Neurological:     Mental Status: She is alert.  Psychiatric:        Speech: Speech normal.        Behavior: Behavior normal.        Thought Content: Thought content normal.

## 2022-10-16 NOTE — Assessment & Plan Note (Addendum)
Exam consistent with conjunctivitis. discussed whether viral versus bacterial versus allergic conjunctivitis.  As she is been taking Polytrim at home for a few days prn, we discussed if incomplete  treatment has allowed for persistent symptoms. I have sent in erythromycin ointment for 7-day course.

## 2022-10-23 ENCOUNTER — Encounter: Payer: Self-pay | Admitting: Family

## 2022-10-24 ENCOUNTER — Other Ambulatory Visit (HOSPITAL_COMMUNITY): Payer: Self-pay

## 2022-10-30 NOTE — Telephone Encounter (Signed)
NOTED

## 2022-11-06 ENCOUNTER — Ambulatory Visit: Payer: Medicaid Other | Admitting: Psychiatry

## 2022-11-06 ENCOUNTER — Telehealth: Payer: Self-pay

## 2022-11-06 ENCOUNTER — Encounter: Payer: Self-pay | Admitting: Family

## 2022-11-06 NOTE — Telephone Encounter (Signed)
Patient Advocate Encounter  Received a fax from [facility] regarding Prior Authorization for Monjaro 2.5MG /0.5ML Soln pen-injector.   Authorization has been DENIED due to  Determination letter attached to patient chart

## 2022-11-06 NOTE — Telephone Encounter (Signed)
Pt is aware and she stated that she will be in touch

## 2022-11-09 ENCOUNTER — Encounter: Payer: Self-pay | Admitting: Family

## 2022-11-10 NOTE — Telephone Encounter (Signed)
Form downloaded

## 2022-11-13 NOTE — Progress Notes (Deleted)
BH MD/PA/NP OP Progress Note  11/13/2022 8:11 AM Sue Hernandez  MRN:  [id number]  Chief Complaint: No chief complaint on file.  HPI: ***    Wt Readings from Last 3 Encounters:  10/16/22 203 lb 12.8 oz (92.4 kg)  09/07/22 199 lb 6.4 oz (90.4 kg)  07/31/22 193 lb 12.8 oz (87.9 kg)     Visit Diagnosis: No diagnosis found.  Past Psychiatric History: Please see initial evaluation for full details. I have reviewed the history. No updates at this time.     Past Medical History:  Past Medical History:  Diagnosis Date   COVID-19    fall 2021   Depression    Dr. Ardelle Anton   Eating disorder    bulemia   Migraines     Past Surgical History:  Procedure Laterality Date   CESAREAN SECTION N/A 10/07/2016   Procedure: CESAREAN SECTION;  Surgeon: Christeen Douglas, MD;  Location: [facility] ORS;  Service: Obstetrics;  Laterality: N/A;   CESAREAN SECTION N/A 02/24/2020   Procedure: CESAREAN SECTION;  Surgeon: Feliberto Gottron, Ihor Austin, MD;  Location: [facility] ORS;  Service: Obstetrics;  Laterality: N/A;   CHOLECYSTECTOMY  2009   LAPAROSCOPIC GASTRIC BANDING  2009   Dr. Terrill Mohr   LAPAROSCOPIC GASTRIC SLEEVE RESECTION  01/2013   TONSILLECTOMY  2006   UPPER GI ENDOSCOPY  2014   hiatial hernia, Dr. Smitty Cords    Family Psychiatric History: Please see initial evaluation for full details. I have reviewed the history. No updates at this time.     Family History:  Family History  Problem Relation Age of Onset   Depression Mother    High Cholesterol Mother    Anorexia nervosa Mother    Hypertension Mother    ADD / ADHD Brother    Anxiety disorder Brother    Cancer Maternal Grandmother        lung   Cancer Maternal Grandfather        lung   Cancer Paternal Grandmother        Breast   Heart disease Paternal Grandfather    Cancer Paternal Grandfather        renal cancer went to his lungs   Colon cancer Neg Hx    Thyroid cancer Neg Hx     Social History:  Social History   Socioeconomic History    Marital status: Married    Spouse name: Not on file   Number of children: Not on file   Years of education: Not on file   Highest education level: Not on file  Occupational History   Not on file  Tobacco Use   Smoking status: Never   Smokeless tobacco: Never  Substance and Sexual Activity   Alcohol use: No    Comment: Maybe once a month   Drug use: No   Sexual activity: Yes  Other Topics Concern   Not on file  Social History Narrative   Lives in [place] with husband      Work - home      Two children- 4 years, 1 years      Diet - regular, followed bariatric clinic   Social Determinants of Health   Financial Resource Strain: Not on file  Food Insecurity: Not on file  Transportation Needs: Not on file  Physical Activity: Not on file  Stress: Not on file  Social Connections: Not on file    Allergies:  Allergies  Allergen Reactions   Advil [Ibuprofen] Swelling    Swelling of face  and hands   Derma Guard [Protective Adhesive Powder]     Dermabond - urticaria    Metabolic Disorder Labs: Lab Results  Component Value Date   HGBA1C 5.8 07/31/2022   No results found for: "PROLACTIN" Lab Results  Component Value Date   CHOL 194 07/31/2022   TRIG 218.0 (H) 07/31/2022   HDL 44.80 07/31/2022   CHOLHDL 4 07/31/2022   VLDL 43.6 (H) 07/31/2022   LDLCALC 116 (H) 04/11/2021   LDLCALC 86 09/22/2014   Lab Results  Component Value Date   TSH 2.22 04/11/2021   TSH 2.18 02/04/2015    Therapeutic Level Labs: No results found for: "LITHIUM" No results found for: "VALPROATE" No results found for: "CBMZ"  Current Medications: Current Outpatient Medications  Medication Sig Dispense Refill   buPROPion (WELLBUTRIN XL) 300 MG 24 hr tablet Take 1 tablet (300 mg total) by mouth daily. 30 tablet 1   erythromycin ophthalmic ointment Use one half inch four times daily to affected eye (s) x 7 days. 3.5 g 0   escitalopram (LEXAPRO) 20 MG tablet Take 1 tablet (20 mg total) by  mouth daily. 30 tablet 1   tirzepatide (MOUNJARO) 2.5 MG/0.5ML Pen Inject 2.5 mg into the skin once a week. 2 mL 3   No current facility-administered medications for this visit.     Musculoskeletal: Strength & Muscle Tone:  N/A Gait & Station:  N/A Patient leans: N/A  Psychiatric Specialty Exam: Review of Systems  There were no vitals taken for this visit.There is no height or weight on file to calculate BMI.  General Appearance: {Appearance:22683}  Eye Contact:  {BHH EYE CONTACT:22684}  Speech:  Clear and Coherent  Volume:  Normal  Mood:  {BHH MOOD:22306}  Affect:  {Affect (PAA):22687}  Thought Process:  Coherent  Orientation:  Full (Time, Place, and Person)  Thought Content: Logical   Suicidal Thoughts:  {ST/HT (PAA):22692}  Homicidal Thoughts:  {ST/HT (PAA):22692}  Memory:  Immediate;   Good  Judgement:  {Judgement (PAA):22694}  Insight:  {Insight (PAA):22695}  Psychomotor Activity:  Normal  Concentration:  Concentration: Good and Attention Span: Good  Recall:  Good  Fund of Knowledge: Good  Language: Good  Akathisia:  No  Handed:  Right  AIMS (if indicated): not done  Assets:  Communication Skills Desire for Improvement  ADL's:  Intact  Cognition: WNL  Sleep:  {BHH GOOD/FAIR/POOR:22877}   Screenings: GAD-7    Loss adjuster, chartered Office Visit from 09/07/2022 in [facility] Visit from 07/13/2022 in [facility] Office Visit from 05/22/2022 in [facility] Office Visit from 02/06/2022 in [facility] [employer] Visit from 06/27/2021 in [facility] [employer]  Total GAD-7 Score 5 2 4 3 5       [employer]    Flowsheet Row Office Visit from 10/16/2022 in [facility] [employer] Visit from 09/07/2022 in [facility]  Office Visit from 07/13/2022 in [facility] Office Visit from 05/22/2022 in [facility] Office Visit from 02/06/2022 in [facility] Total Score 0 4 2 2 3   PHQ-9 Total Score -- 13 6 12 6       Flowsheet Row Office Visit from 07/13/2022 in [facility] Office Visit from 05/22/2022 in [facility]  C-SSRS RISK CATEGORY Error: Question 2 not populated Error: Q3, 4, or 5 should not be populated when Q2 is No        Assessment and Plan:  Sue Hernandez is a 42 y.o. year old female with a history of depression, anxiety, migraine, who presents for follow up appointment for below.    1. MDD (major depressive disorder), recurrent episode, moderate (HCC) Acute stressors include: dental pain, unable to afford Mounjaro Other stressors include: financial strain, marital conflict    History:   There has been worsening in depressive symptoms and anxiety in the context of stressors as above.  We uptitrate bupropion to optimize treatment for depression.  She has no known history of seizure.  Will continue to ask approach to target depression.  She will greatly benefit from CBT; she will continue to see a therapist.    2. Other specified eating disorder Worsening in the setting of discontinuation on Mounjaro due to financial strain. She reports history of bulimia, binge eating, although she denies history of anorexia. Will continue to assess.     Plan Continue Lexapro 20 mg daily  Increase bupropion 300 mg daily  Next appointment: 4/15 at 11 AM for 30 mins, in person    The patient demonstrates the following risk factors for suicide: Chronic risk factors for suicide include: psychiatric disorder of depression, anxiety . Acute risk factors for suicide include: unemployment. Protective factors for  this patient include: positive social support, responsibility to others (children, family), coping skills, and hope for the future. Considering these factors, the overall suicide risk at this point appears to be low. Patient is appropriate for outpatient follow up.     Collaboration of Care: Collaboration of Care: {BH OP Collaboration of Care:21014065}  Patient/Guardian was advised Release of Information must be obtained prior to any record release in order to collaborate their care with an outside provider. Patient/Guardian was advised if they have not already done so to contact the registration department to sign all necessary forms in order for [place] to release information regarding their care.   Consent: Patient/Guardian gives verbal consent for treatment and assignment of benefits for services provided during this visit. Patient/Guardian expressed understanding and agreed to proceed.    Neysa Hotter, MD 11/13/2022, 8:11 AM

## 2022-11-15 ENCOUNTER — Other Ambulatory Visit: Payer: Self-pay | Admitting: Psychiatry

## 2022-11-15 ENCOUNTER — Telehealth: Payer: Self-pay | Admitting: Psychiatry

## 2022-11-15 MED ORDER — BUPROPION HCL ER (XL) 300 MG PO TB24: 300.0000 mg | ORAL_TABLET | Freq: Every day | ORAL | 0 refills | Status: DC

## 2022-11-15 NOTE — Telephone Encounter (Signed)
Ordered

## 2022-11-15 NOTE — Telephone Encounter (Signed)
Patient had to leave for family emergency out of state and cannot keep appointment for 11/16/22. She is rescheduled to 5/3 but does not have a refill on medication, bupropion 300 mg. Please send to [facility] on [address]

## 2022-11-16 ENCOUNTER — Telehealth: Payer: Medicaid Other | Admitting: Psychiatry

## 2022-11-17 ENCOUNTER — Encounter: Payer: Self-pay | Admitting: Family

## 2022-11-18 ENCOUNTER — Other Ambulatory Visit: Payer: Self-pay | Admitting: Family

## 2022-11-18 DIAGNOSIS — E669 Obesity, unspecified: Secondary | ICD-10-CM

## 2022-11-18 MED ORDER — TIRZEPATIDE 5 MG/0.5ML ~~LOC~~ SOAJ: 5.0000 mg | SUBCUTANEOUS | 3 refills | Status: DC

## 2022-11-23 NOTE — Progress Notes (Signed)
Virtual Visit via Video Note  I connected with Bascom Levels on 11/24/22 at  9:00 AM EDT by a video enabled telemedicine application and verified that I am speaking with the correct person using two identifiers.  Location: Patient: home Provider: office Persons participated in the visit- patient, provider    I discussed the limitations of evaluation and management by telemedicine and the availability of in person appointments. The patient expressed understanding and agreed to proceed.     I discussed the assessment and treatment plan with the patient. The patient was provided an opportunity to ask questions and all were answered. The patient agreed with the plan and demonstrated an understanding of the instructions.   The patient was advised to call back or seek an in-person evaluation if the symptoms worsen or if the condition fails to improve as anticipated.  I provided 17 minutes of non-face-to-face time during this encounter.   Neysa Hotter, MD     [facility] MD/PA/NP OP Progress Note  11/24/2022 9:37 AM HAZEL SAFFEL  MRN:  [id number]  Chief Complaint:  Chief Complaint  Patient presents with   Follow-up   HPI:  This is a follow-up appointment for depression, eating disorder.  She states that she has been super busy.  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  She feels excited about this.  Although she thinks she has been feeling less irritable, she still feels emotionally drained after she gets back from work.  She tends to be irritable, and she thinks it has been affecting the marriage.  She is concerned about the financial strain, paying bills.  She reports frustration about her insurance not covering Mounjaro  anymore, although she switched to the new insurance with the hope that it covers it.  She works with her provider to appeal its coverage on another insurance, and restarted this medication, although she needs to pay $ 500.  She thinks her eating  has been slightly better, although she continues to do binge eating and purging.  She denies SI. She denies anxiety except she is worried about finances.  She is willing to try higher dose of bupropion.   Substance use  Tobacco Alcohol Other substances/  Current  denies denies  Past  denies denies  Past Treatment        Support: husband Household: husband, 2 children Marital status: married Number of children: 2 (2 and 5 yo) Employment: part time at [employer] at [employer]:   Last PCP / ongoing medical evaluation:   Wt Readings from Last 3 Encounters:  10/16/22 203 lb 12.8 oz (92.4 kg)  09/07/22 199 lb 6.4 oz (90.4 kg)  07/31/22 193 lb 12.8 oz (87.9 kg)     Visit Diagnosis:    ICD-10-CM   1. MDD (major depressive disorder), recurrent episode, moderate (HCC)  F33.1     2. 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 I have reviewed the history. No updates at this time.     Past Medical History:  Past Medical History:  Diagnosis Date   COVID-19    fall 2021   Depression    Dr. Ardelle Anton   Eating disorder    bulemia   Migraines     Past Surgical History:  Procedure Laterality Date   CESAREAN SECTION N/A 10/07/2016   Procedure: CESAREAN SECTION;  Surgeon: Christeen Douglas, MD;  Location: [facility] ORS;  Service: Obstetrics;  Laterality: N/A;   CESAREAN SECTION N/A 02/24/2020   Procedure: CESAREAN SECTION;  Surgeon: Feliberto Gottron, Ihor Austin, MD;  Location: [facility] ORS;  Service: Obstetrics;  Laterality: N/A;   CHOLECYSTECTOMY  2009   LAPAROSCOPIC GASTRIC BANDING  2009   Dr. Terrill Mohr   LAPAROSCOPIC GASTRIC SLEEVE RESECTION  01/2013   TONSILLECTOMY  2006   UPPER GI ENDOSCOPY  2014   hiatial hernia, Dr. Smitty Cords    Family Psychiatric History: Please see initial evaluation for full details. I have reviewed the history. No updates at this time.     Family History:  Family History   Problem Relation Age of Onset   Depression Mother    High Cholesterol Mother    Anorexia nervosa Mother    Hypertension Mother    ADD / ADHD Brother    Anxiety disorder Brother    Cancer Maternal Grandmother        lung   Cancer Maternal Grandfather        lung   Cancer Paternal Grandmother        Breast   Heart disease Paternal Grandfather    Cancer Paternal Grandfather        renal cancer went to his lungs   Colon cancer Neg Hx    Thyroid cancer Neg Hx     Social History:  Social History   Socioeconomic History   Marital status: Married    Spouse name: Not on file   Number of children: Not on file   Years of education: Not on file   Highest education level: Not on file  Occupational History   Not on file  Tobacco Use   Smoking status: Never   Smokeless tobacco: Never  Substance and Sexual Activity   Alcohol use: No    Comment: Maybe once a month   Drug use: No   Sexual activity: Yes  Other Topics Concern   Not on file  Social History Narrative   Lives in [place] with husband      Work - home      Two children- 4 years, 1 years      Diet - regular, followed bariatric clinic   Social Determinants of Health   Financial Resource Strain: Not on file  Food Insecurity: Not on file  Transportation Needs: Not on file  Physical Activity: Not on file  Stress: Not on file  Social Connections: Not on file    Allergies:  Allergies  Allergen Reactions   Advil [Ibuprofen] Swelling    Swelling of face and hands   Derma Guard [Protective Adhesive Powder]     Dermabond - urticaria    Metabolic Disorder Labs: Lab Results  Component Value Date   HGBA1C 5.8 07/31/2022   No results found for: "PROLACTIN" Lab Results  Component Value Date   CHOL 194 07/31/2022   TRIG 218.0 (H) 07/31/2022   HDL 44.80 07/31/2022   CHOLHDL 4 07/31/2022   VLDL 43.6 (H) 07/31/2022   LDLCALC 116 (H) 04/11/2021   LDLCALC 86 09/22/2014   Lab Results  Component Value Date    TSH 2.22 04/11/2021   TSH 2.18 02/04/2015    Therapeutic Level Labs: No results found for: "LITHIUM" No results found for: "VALPROATE" No results found for: "CBMZ"  Current Medications: Current Outpatient Medications  Medication Sig Dispense Refill   buPROPion (WELLBUTRIN XL) 150 MG 24 hr tablet Take 1 tablet (150 mg total) by mouth daily. Take total of 450 mg  daily. Take along with 300 mg tab 90 tablet 0   [START ON 12/15/2022] buPROPion (WELLBUTRIN XL) 300 MG 24 hr tablet Take 1 tablet (300 mg total) by mouth daily. Take total of 450 mg daily. Take along with 150 mg tab 90 tablet 0   erythromycin ophthalmic ointment Use one half inch four times daily to affected eye (s) x 7 days. 3.5 g 0   escitalopram (LEXAPRO) 20 MG tablet Take 1 tablet (20 mg total) by mouth daily. 90 tablet 1   tirzepatide (MOUNJARO) 5 MG/0.5ML Pen Inject 5 mg into the skin once a week. 6 mL 3   No current facility-administered medications for this visit.     Musculoskeletal: Strength & Muscle Tone:  N/A Gait & Station:  N/A Patient leans: N/A  Psychiatric Specialty Exam: Review of Systems  Psychiatric/Behavioral:  Positive for dysphoric mood and sleep disturbance. Negative for agitation, behavioral problems, confusion, decreased concentration, hallucinations, self-injury and suicidal ideas. The patient is nervous/anxious. The patient is not hyperactive.   All other systems reviewed and are negative.   There were no vitals taken for this visit.There is no height or weight on file to calculate BMI.  General Appearance: Fairly Groomed  Eye Contact:  Good  Speech:  Clear and Coherent  Volume:  Normal  Mood:   better  Affect:  Appropriate, Congruent, and calm  Thought Process:  Coherent  Orientation:  Full (Time, Place, and Person)  Thought Content: Logical   Suicidal Thoughts:  No  Homicidal Thoughts:  No  Memory:  Immediate;   Good  Judgement:  Good  Insight:  Good  Psychomotor Activity:  Normal   Concentration:  Concentration: Good and Attention Span: Good  Recall:  Good  Fund of Knowledge: Good  Language: Good  Akathisia:  No  Handed:  Right  AIMS (if indicated): not done  Assets:  Communication Skills Desire for Improvement  ADL's:  Intact  Cognition: WNL  Sleep:  Fair   Screenings: GAD-7    Flowsheet Row Office Visit from 09/07/2022 in [facility] Office Visit from 07/13/2022 in [facility] Office Visit from 05/22/2022 in [facility] Office Visit from 02/06/2022 in [facility] [employer] Visit from 06/27/2021 in [facility] [employer]  Total GAD-7 Score 5 2 4 3 5       [employer]    Flowsheet Row Office Visit from 10/16/2022 in [facility] [employer] Visit from 09/07/2022 in [facility] Office Visit from 07/13/2022 in [facility] Office Visit from 05/22/2022 in [facility] Office Visit from 02/06/2022 in [facility] [employer]  PHQ-2 Total Score 0 4 2 2 3   PHQ-9 Total Score -- 13 6 12 6       Flowsheet Row Office Visit from 07/13/2022 in [facility] Office Visit from 05/22/2022 in [facility]  C-SSRS RISK CATEGORY Error: Question 2 not populated Error: Q3, 4, or 5 should not be populated when Q2 is No        Assessment and Plan:  IRYNA CORBO is a 42 y.o. year old female with a history of depression, anxiety, migraine, s/p bariatric surgery/gastric sleeve in 2010/2014, who presents for follow up appointment for below.   1. MDD (major depressive disorder), recurrent  episode, moderate (HCC) Acute stressors include:  dental pain, work related stress-improving Other stressors include: financial strain, marital conflict    History: Sees a therapist Although she reports improvement in irritability since uptitration of bupropion, she continues to experience irritability, exhaustion in the context of stressors as above.  Will uptitrate bupropion to optimize treatment for depression.  She has no known history of seizure.   2. Other specified eating disorder - She has a history of bulimia, binge eating, although she denies history of anorexia.  Slightly worsening in binging, pursing since the last visit, although there has been slight improvement since starting Mounjaro.  Will continue to assess.  May consider topiramate if she is unable to continue [facility].    Plan Continue Lexapro 20 mg daily  Increase bupropion 450 mg daily  Next appointment: 6/19 at 11 30 for 30 mins, video   The patient demonstrates the following risk factors for suicide: Chronic risk factors for suicide include: psychiatric disorder of depression, anxiety . Acute risk factors for suicide include: unemployment. Protective factors for this patient include: positive social support, responsibility to others (children, family), coping skills, and hope for the future. Considering these factors, the overall suicide risk at this point appears to be low. Patient is appropriate for outpatient follow up.     Collaboration of Care: Collaboration of Care: Other reviewed notes in Epic  Patient/Guardian was advised Release of Information must be obtained prior to any record release in order to collaborate their care with an outside provider. Patient/Guardian was advised if they have not already done so to contact the registration department to sign all necessary forms in order for [place] to release information regarding their care.   Consent: Patient/Guardian gives verbal consent for treatment and assignment of benefits for services provided during this visit.  Patient/Guardian expressed understanding and agreed to proceed.    Neysa Hotter, MD 11/24/2022, 9:37 AM

## 2022-11-24 ENCOUNTER — Telehealth (INDEPENDENT_AMBULATORY_CARE_PROVIDER_SITE_OTHER): Payer: BLUE CROSS/BLUE SHIELD | Admitting: Psychiatry

## 2022-11-24 ENCOUNTER — Encounter: Payer: Self-pay | Admitting: Psychiatry

## 2022-11-24 DIAGNOSIS — F331 Major depressive disorder, recurrent, moderate: Secondary | ICD-10-CM | POA: Diagnosis not present

## 2022-11-24 DIAGNOSIS — F5089 Other specified eating disorder: Secondary | ICD-10-CM | POA: Diagnosis not present

## 2022-11-24 MED ORDER — BUPROPION HCL ER (XL) 150 MG PO TB24: 150.0000 mg | ORAL_TABLET | Freq: Every day | ORAL | 0 refills | Status: DC

## 2022-11-24 MED ORDER — BUPROPION HCL ER (XL) 300 MG PO TB24: 300.0000 mg | ORAL_TABLET | Freq: Every day | ORAL | 0 refills | Status: DC

## 2022-11-24 MED ORDER — ESCITALOPRAM OXALATE 20 MG PO TABS: 20.0000 mg | ORAL_TABLET | Freq: Every day | ORAL | 1 refills | Status: DC

## 2022-11-24 NOTE — Patient Instructions (Signed)
Continue Lexapro 20 mg daily  Increase bupropion 450 mg daily  Next appointment: 6/19 at 11 30

## 2022-11-29 ENCOUNTER — Encounter: Payer: Self-pay | Admitting: Family

## 2022-11-30 ENCOUNTER — Other Ambulatory Visit: Payer: Self-pay

## 2022-11-30 MED ORDER — ZEPBOUND 5 MG/0.5ML ~~LOC~~ SOAJ: 5.0000 mg | SUBCUTANEOUS | 3 refills | Status: DC

## 2022-11-30 NOTE — Progress Notes (Signed)
RX ORDERED.

## 2022-12-11 ENCOUNTER — Encounter: Payer: Self-pay | Admitting: Family

## 2022-12-11 ENCOUNTER — Other Ambulatory Visit: Payer: Self-pay | Admitting: Psychiatry

## 2022-12-11 NOTE — Telephone Encounter (Signed)
Noted  

## 2022-12-22 ENCOUNTER — Encounter: Payer: Self-pay | Admitting: Family

## 2022-12-22 ENCOUNTER — Ambulatory Visit: Payer: BLUE CROSS/BLUE SHIELD | Admitting: Family

## 2022-12-22 ENCOUNTER — Other Ambulatory Visit: Payer: Self-pay | Admitting: Family

## 2022-12-22 VITALS — BP 120/74 | HR 88 | Temp 98.2°F | Ht 63.0 in | Wt 205.4 lb

## 2022-12-22 DIAGNOSIS — E669 Obesity, unspecified: Secondary | ICD-10-CM

## 2022-12-22 DIAGNOSIS — Z6836 Body mass index (BMI) 36.0-36.9, adult: Secondary | ICD-10-CM | POA: Diagnosis not present

## 2022-12-22 MED ORDER — ZEPBOUND 7.5 MG/0.5ML ~~LOC~~ SOAJ: 7.5000 mg | SUBCUTANEOUS | 5 refills | Status: DC

## 2022-12-22 NOTE — Progress Notes (Unsigned)
   Assessment & Plan:  There are no diagnoses linked to this encounter.   Return precautions given.   Risks, benefits, and alternatives of the medications and treatment plan prescribed today were discussed, and patient expressed understanding.   Education regarding symptom management and diagnosis given to patient on AVS either electronically or printed.  No follow-ups on file.  Rennie Plowman, FNP  Subjective:    Patient ID: Sue Hernandez, female    DOB: 1981/04/30, 42 y.o.   MRN: [id number]  CC: Sue Hernandez is a 42 y.o. female who presents today for follow up.   HPI: HPI  Allergies: Advil [ibuprofen] and Derma guard [protective adhesive powder] Current Outpatient Medications on File Prior to Visit  Medication Sig Dispense Refill   buPROPion (WELLBUTRIN XL) 150 MG 24 hr tablet Take 1 tablet (150 mg total) by mouth daily. Take total of 450 mg daily. Take along with 300 mg tab 90 tablet 0   buPROPion (WELLBUTRIN XL) 300 MG 24 hr tablet Take 1 tablet (300 mg total) by mouth daily. Take total of 450 mg daily. Take along with 150 mg tab 90 tablet 0   escitalopram (LEXAPRO) 20 MG tablet Take 1 tablet (20 mg total) by mouth daily. 90 tablet 1   tirzepatide (ZEPBOUND) 5 MG/0.5ML Pen Inject 5 mg into the skin once a week. 2 mL 3   No current facility-administered medications on file prior to visit.    Review of Systems    Objective:    BP 120/74   Pulse 88   Temp 98.2 F (36.8 C) (Oral)   Ht 5\' 3"  (1.6 m)   Wt 205 lb 6.4 oz (93.2 kg)   SpO2 99%   BMI 36.38 kg/m  BP Readings from Last 3 Encounters:  12/22/22 120/74  10/16/22 126/78  07/31/22 100/80   Wt Readings from Last 3 Encounters:  12/22/22 205 lb 6.4 oz (93.2 kg)  10/16/22 203 lb 12.8 oz (92.4 kg)  07/31/22 193 lb 12.8 oz (87.9 kg)    Physical Exam

## 2022-12-25 NOTE — Patient Instructions (Signed)
Always a pleasure seeing you.  Please let me know if note to [employer] does not work.  I have sent in Zepbound 7.5 mg for you to start.

## 2022-12-25 NOTE — Assessment & Plan Note (Signed)
Weight loss has plateaued.  Increase Zepbound to 7.5mg . Note provided for appeal for insurance to cover Zepbound or wegovy.

## 2023-01-06 NOTE — Progress Notes (Deleted)
[facility] MD/PA/NP OP Progress Note  01/06/2023 8:30 AM Sue Hernandez  MRN:  [id number]  Chief Complaint: No chief complaint on file.  HPI: *** Visit Diagnosis: No diagnosis found.  Past Psychiatric History: Please see initial evaluation for full details. I have reviewed the history. No updates at this time.     Past Medical History:  Past Medical History:  Diagnosis Date   COVID-19    fall 2021   Depression    Dr. Ardelle Anton   Eating disorder    bulemia   Migraines     Past Surgical History:  Procedure Laterality Date   CESAREAN SECTION N/A 10/07/2016   Procedure: CESAREAN SECTION;  Surgeon: Christeen Douglas, MD;  Location: [facility] ORS;  Service: Obstetrics;  Laterality: N/A;   CESAREAN SECTION N/A 02/24/2020   Procedure: CESAREAN SECTION;  Surgeon: Feliberto Gottron, Ihor Austin, MD;  Location: [facility] OR[facility];  Service: Obstetrics;  Laterality: N/A;   CHOLECYSTECTOMY  2009   LAPAROSCOPIC GASTRIC BANDING  2009   Dr. Terrill Mohr   LAPAROSCOPIC GASTRIC SLEEVE RESECTION  01/2013   TONSILLECTOMY  2006   UPPER GI ENDOSCOPY  2014   hiatial hernia, Dr. Smitty Cords    Family Psychiatric History: Please see initial evaluation for full details. I have reviewed the history. No updates at this time.     Family History:  Family History  Problem Relation Age of Onset   Depression Mother    High Cholesterol Mother    Anorexia nervosa Mother    Hypertension Mother    ADD / ADHD Brother    Anxiety disorder Brother    Cancer Maternal Grandmother        lung   Cancer Maternal Grandfather        lung   Cancer Paternal Grandmother        Breast   Heart disease Paternal Grandfather    Cancer Paternal Grandfather        renal cancer went to his lungs   Colon cancer Neg Hx    Thyroid cancer Neg Hx     Social History:  Social History   Socioeconomic History   Marital status: Married    Spouse name: Not on file   Number of children: Not on file   Years of education: Not on file   Highest education level: Not  on file  Occupational History   Not on file  Tobacco Use   Smoking status: Never   Smokeless tobacco: Never  Substance and Sexual Activity   Alcohol use: No    Comment: Maybe once a month   Drug use: No   Sexual activity: Yes  Other Topics Concern   Not on file  Social History Narrative   Lives in [place] with husband      Work - home      Two children- 4 years, 1 years      Diet - regular, followed bariatric clinic   Social Determinants of Health   Financial Resource Strain: Not on file  Food Insecurity: Not on file  Transportation Needs: Not on file  Physical Activity: Not on file  Stress: Not on file  Social Connections: Not on file    Allergies:  Allergies  Allergen Reactions   Advil [Ibuprofen] Swelling    Swelling of face and hands   Derma Guard [Protective Adhesive Powder]     Dermabond - urticaria    Metabolic Disorder Labs: Lab Results  Component Value Date   HGBA1C 5.8 07/31/2022   No results  found for: "PROLACTIN" Lab Results  Component Value Date   CHOL 194 07/31/2022   TRIG 218.0 (H) 07/31/2022   HDL 44.80 07/31/2022   CHOLHDL 4 07/31/2022   VLDL 43.6 (H) 07/31/2022   LDLCALC 116 (H) 04/11/2021   LDLCALC 86 09/22/2014   Lab Results  Component Value Date   TSH 2.22 04/11/2021   TSH 2.18 02/04/2015    Therapeutic Level Labs: No results found for: "LITHIUM" No results found for: "VALPROATE" No results found for: "CBMZ"  Current Medications: Current Outpatient Medications  Medication Sig Dispense Refill   buPROPion (WELLBUTRIN XL) 150 MG 24 hr tablet Take 1 tablet (150 mg total) by mouth daily. Take total of 450 mg daily. Take along with 300 mg tab 90 tablet 0   buPROPion (WELLBUTRIN XL) 300 MG 24 hr tablet Take 1 tablet (300 mg total) by mouth daily. Take total of 450 mg daily. Take along with 150 mg tab 90 tablet 0   escitalopram (LEXAPRO) 20 MG tablet Take 1 tablet (20 mg total) by mouth daily. 90 tablet 1   tirzepatide  (ZEPBOUND) 7.5 MG/0.5ML Pen INJECT 7.5 MG SUBCUTANEOUSLY WEEKLY 2 mL 5   No current facility-administered medications for this visit.     Musculoskeletal: Strength & Muscle Tone:  N/A Gait & Station:  N/A Patient leans: N/A  Psychiatric Specialty Exam: Review of Systems  There were no vitals taken for this visit.There is no height or weight on file to calculate BMI.  General Appearance: {Appearance:22683}  Eye Contact:  {BHH EYE CONTACT:22684}  Speech:  Clear and Coherent  Volume:  Normal  Mood:  {BHH MOOD:22306}  Affect:  {Affect (PAA):22687}  Thought Process:  Coherent  Orientation:  Full (Time, Place, and Person)  Thought Content: Logical   Suicidal Thoughts:  {ST/HT (PAA):22692}  Homicidal Thoughts:  {ST/HT (PAA):22692}  Memory:  Immediate;   Good  Judgement:  {Judgement (PAA):22694}  Insight:  {Insight (PAA):22695}  Psychomotor Activity:  Normal  Concentration:  Concentration: Good and Attention Span: Good  Recall:  Good  Fund of Knowledge: Good  Language: Good  Akathisia:  No  Handed:  Right  AIMS (if indicated): not done  Assets:  Communication Skills Desire for Improvement  ADL's:  Intact  Cognition: WNL  Sleep:  {BHH GOOD/FAIR/POOR:22877}   Screenings: GAD-7    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/22/2022 in [facility] [employer] Visit from 09/07/2022 in [facility] Office Visit from 07/13/2022 in [facility] Office Visit from 05/22/2022 in [facility] Office Visit from 02/06/2022 in [facility] [employer]  Total GAD-7 Score 2 5 2 4 3       PHQ2-9    Flowsheet Row Office Visit from 12/22/2022 in [facility] [employer] Visit from 10/16/2022 in [facility] [employer] Visit from 09/07/2022 in [facility] Office Visit from 07/13/2022 in [facility] Office Visit from 05/22/2022 in [facility]  PHQ-2 Total Score 2 0 4 2 2   PHQ-9 Total Score 4 -- 13 6 12       Flowsheet Row Office Visit from 07/13/2022 in [facility] Office Visit from 05/22/2022 in [facility]  C-SSRS RISK CATEGORY Error: Question 2 not populated Error: Q3, 4, or 5 should not be  populated when Q2 is No        Assessment and Plan:  GENISIS YURCHAK is a 42 y.o. year old female with a history of depression, anxiety, migraine, s/p bariatric surgery/gastric sleeve in 2010/2014, who presents for follow up appointment for below.    1. MDD (major depressive disorder), recurrent episode, moderate (HCC) Acute stressors include: dental pain, work related stress-improving Other stressors include: financial strain, marital conflict    History: Sees a therapist Although she reports improvement in irritability since uptitration of bupropion, she continues to experience irritability, exhaustion in the context of stressors as above.  Will uptitrate bupropion to optimize treatment for depression.  She has no known history of seizure.    2. Other specified eating disorder - She has a history of bulimia, binge eating, although she denies history of anorexia.  Slightly worsening in binging, pursing since the last visit, although there has been slight improvement since starting Mounjaro.  Will continue to assess.  May consider topiramate if she is unable to continue [facility].    Plan Continue Lexapro 20 mg daily  Increase bupropion 450 mg daily  Next appointment: 6/19 at 11 30 for 30 mins, video   The patient demonstrates the following risk factors for suicide: Chronic risk factors for suicide include: psychiatric disorder of depression,  anxiety . Acute risk factors for suicide include: unemployment. Protective factors for this patient include: positive social support, responsibility to others (children, family), coping skills, and hope for the future. Considering these factors, the overall suicide risk at this point appears to be low. Patient is appropriate for outpatient follow up.     Collaboration of Care: Collaboration of Care: {BH OP Collaboration of Care:[id number]}  Patient/Guardian was advised Release of Information must be obtained prior to any record release in order to collaborate their care with an outside provider. Patient/Guardian was advised if they have not already done so to contact the registration department to sign all necessary forms in order for [place] to release information regarding their care.   Consent: Patient/Guardian gives verbal consent for treatment and assignment of benefits for services provided during this visit. Patient/Guardian expressed understanding and agreed to proceed.    Neysa Hotter, MD 01/06/2023, 8:30 AM

## 2023-01-10 ENCOUNTER — Telehealth: Payer: BLUE CROSS/BLUE SHIELD | Admitting: Psychiatry

## 2023-01-10 NOTE — Progress Notes (Signed)
Virtual Visit via Video Note  I connected with Sue Hernandez on 01/18/23 at  2:00 PM EDT by a video enabled telemedicine application and verified that I am speaking with the correct person using two identifiers.  Location: Patient: home Provider: office Persons participated in the visit- patient, provider    I discussed the limitations of evaluation and management by telemedicine and the availability of in person appointments. The patient expressed understanding and agreed to proceed.    I discussed the assessment and treatment plan with the patient. The patient was provided an opportunity to ask questions and all were answered. The patient agreed with the plan and demonstrated an understanding of the instructions.   The patient was advised to call back or seek an in-person evaluation if the symptoms worsen or if the condition fails to improve as anticipated.  I provided 13 minutes of non-face-to-face time during this encounter.   Neysa Hotter, MD     [facility] MD/PA/NP OP Progress Note  01/18/2023 2:33 PM Sue Hernandez  MRN:  [id number]  Chief Complaint:  Chief Complaint  Patient presents with   Follow-up   HPI:  This is a follow-up appointment for depression and anxiety.  She states that she has been doing good.  She thinks that higher dose of bupropion has made some changes over the past few weeks.  She feels more content.  She feels less irritable, and has been handling it better.  She states that the work environment has been getting better. 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 She talks about her son, who is around 11-year-old, who has spurts of intense anger.  She feels that some of this is coming from her as she used to yell when she gets angry.  She agrees that she is now trying to model for him the way she handles the stress.  She sleeps fair most of the time.  She reports good benefit from Zepbound, and denies binge eating.  She denies  feeling depressed.  Although she feels anxious at times, it has been less, and it is reasonable given concern of financial strain.  She denies panic attack.  She denies SI.  She feels comfortable to stay on the current medication.    Wt Readings from Last 3 Encounters:  12/22/22 205 lb 6.4 oz (93.2 kg)  10/16/22 203 lb 12.8 oz (92.4 kg)  09/07/22 199 lb 6.4 oz (90.4 kg)     Substance use   Tobacco Alcohol Other substances/  Current   denies denies  Past   denies denies  Past Treatment            Support: husband Household: husband, 2 children Marital status: married Number of children: 2 (2 and 5 yo) Employment: part time at [employer] and tennis [employer] at [employer] Education:   Last PCP / ongoing medical evaluation:      Visit Diagnosis:    ICD-10-CM   1. MDD (major depressive disorder), recurrent episode, moderate (HCC)  F33.1     2. Anxiety disorder, unspecified type  F41.9       Past Psychiatric History: Please see initial evaluation for full details. I have reviewed the history. No updates at this time.     Past Medical History:  Past Medical History:  Diagnosis Date   COVID-19    fall 2021   Depression    Dr. Ardelle Anton   Eating disorder    bulemia   Migraines  Past Surgical History:  Procedure Laterality Date   CESAREAN SECTION N/A 10/07/2016   Procedure: CESAREAN SECTION;  Surgeon: Christeen Douglas, MD;  Location: [facility] ORS;  Service: Obstetrics;  Laterality: N/A;   CESAREAN SECTION N/A 02/24/2020   Procedure: CESAREAN SECTION;  Surgeon: Feliberto Gottron, Ihor Austin, MD;  Location: [facility] ORS;  Service: Obstetrics;  Laterality: N/A;   CHOLECYSTECTOMY  2009   LAPAROSCOPIC GASTRIC BANDING  2009   Dr. Terrill Mohr   LAPAROSCOPIC GASTRIC SLEEVE RESECTION  01/2013   TONSILLECTOMY  2006   UPPER GI ENDOSCOPY  2014   hiatial hernia, Dr. Smitty Cords    Family Psychiatric History: Please see initial evaluation for full details. I have reviewed the history. No updates at this time.      Family History:  Family History  Problem Relation Age of Onset   Depression Mother    High Cholesterol Mother    Anorexia nervosa Mother    Hypertension Mother    ADD / ADHD Brother    Anxiety disorder Brother    Cancer Maternal Grandmother        lung   Cancer Maternal Grandfather        lung   Cancer Paternal Grandmother        Breast   Heart disease Paternal Grandfather    Cancer Paternal Grandfather        renal cancer went to his lungs   Colon cancer Neg Hx    Thyroid cancer Neg Hx     Social History:  Social History   Socioeconomic History   Marital status: Married    Spouse name: Not on file   Number of children: Not on file   Years of education: Not on file   Highest education level: Not on file  Occupational History   Not on file  Tobacco Use   Smoking status: Never   Smokeless tobacco: Never  Substance and Sexual Activity   Alcohol use: No    Comment: Maybe once a month   Drug use: No   Sexual activity: Yes  Other Topics Concern   Not on file  Social History Narrative   Lives in [place] with husband      Work - home      Two children- 4 years, 1 years      Diet - regular, followed bariatric clinic   Social Determinants of Health   Financial Resource Strain: Not on file  Food Insecurity: Not on file  Transportation Needs: Not on file  Physical Activity: Not on file  Stress: Not on file  Social Connections: Not on file    Allergies:  Allergies  Allergen Reactions   Advil [Ibuprofen] Swelling    Swelling of face and hands   Derma Guard [Protective Adhesive Powder]     Dermabond - urticaria    Metabolic Disorder Labs: Lab Results  Component Value Date   HGBA1C 5.8 07/31/2022   No results found for: "PROLACTIN" Lab Results  Component Value Date   CHOL 194 07/31/2022   TRIG 218.0 (H) 07/31/2022   HDL 44.80 07/31/2022   CHOLHDL 4 07/31/2022   VLDL 43.6 (H) 07/31/2022   LDLCALC 116 (H) 04/11/2021   LDLCALC 86 09/22/2014    Lab Results  Component Value Date   TSH 2.22 04/11/2021   TSH 2.18 02/04/2015    Therapeutic Level Labs: No results found for: "LITHIUM" No results found for: "VALPROATE" No results found for: "CBMZ"  Current Medications: Current Outpatient Medications  Medication Sig Dispense Refill   [  START ON 02/22/2023] buPROPion (WELLBUTRIN XL) 150 MG 24 hr tablet Take 1 tablet (150 mg total) by mouth daily. Take total of 450 mg daily. Take along with 300 mg tab 90 tablet 0   buPROPion (WELLBUTRIN XL) 300 MG 24 hr tablet Take 1 tablet (300 mg total) by mouth daily. Take total of 450 mg daily. Take along with 150 mg tab 90 tablet 0   escitalopram (LEXAPRO) 20 MG tablet Take 1 tablet (20 mg total) by mouth daily. 90 tablet 1   tirzepatide (ZEPBOUND) 7.5 MG/0.5ML Pen INJECT 7.5 MG SUBCUTANEOUSLY WEEKLY 2 mL 5   No current facility-administered medications for this visit.     Musculoskeletal: Strength & Muscle Tone:  N/A Gait & Station:  N/A Patient leans: N/A  Psychiatric Specialty Exam: Review of Systems  Psychiatric/Behavioral:  Negative for agitation, behavioral problems, confusion, decreased concentration, dysphoric mood, hallucinations, self-injury, sleep disturbance and suicidal ideas. The patient is nervous/anxious. The patient is not hyperactive.   All other systems reviewed and are negative.   There were no vitals taken for this visit.There is no height or weight on file to calculate BMI.  General Appearance: Fairly Groomed  Eye Contact:  Good  Speech:  Clear and Coherent  Volume:  Normal  Mood:   good  Affect:  Appropriate, Congruent, and Full Range  Thought Process:  Coherent  Orientation:  Full (Time, Place, and Person)  Thought Content: Logical   Suicidal Thoughts:  No  Homicidal Thoughts:  No  Memory:  Immediate;   Good  Judgement:  Good  Insight:  Good  Psychomotor Activity:  Normal  Concentration:  Concentration: Good and Attention Span: Good  Recall:  Good  Fund  of Knowledge: Good  Language: Good  Akathisia:  No  Handed:  Right  AIMS (if indicated): not done  Assets:  Communication Skills Desire for Improvement  ADL's:  Intact  Cognition: WNL  Sleep:  Good   Screenings: GAD-7    Flowsheet Row Office Visit from 12/22/2022 in [facility] Visit from 09/07/2022 in [facility] Visit from 07/13/2022 in [facility] Office Visit from 05/22/2022 in [facility] Office Visit from 02/06/2022 in [facility] [facility]  Total GAD-7 Score 2 5 2 4 3       PHQ2-9    Flowsheet Row Office Visit from 12/22/2022 in [facility] [employer] Visit from 10/16/2022 in [facility] [employer] Visit from 09/07/2022 in [facility] Office Visit from 07/13/2022 in [facility] Office Visit from 05/22/2022 in [facility]  PHQ-2 Total Score 2 0 4 2 2   PHQ-9 Total Score 4 -- 13 6 12       Flowsheet Row Office Visit from 07/13/2022 in [facility] Office Visit from 05/22/2022 in [facility]  C-SSRS RISK CATEGORY Error: Question 2 not populated Error: Q3, 4, or 5 should not be populated when Q2 is No        Assessment and Plan:  Sue Hernandez is a 42 y.o. year old female with a history of depression, anxiety, migraine, s/p bariatric surgery/gastric sleeve in 2010/2014, who presents for follow up appointment for below.   1. MDD (major depressive disorder), recurrent episode, moderate (HCC) 2. Anxiety disorder,  unspecified type Acute stressors include: dental pain, work related stress-improving Other  stressors include: financial strain, marital conflict    History: Sees a therapist  There has been steady improvement in depressive symptoms and anxiety since uptitration of bupropion.  Discussed the importance of maintenance treatment.  She agrees to continue Lexapro to target depression and anxiety, along with bupropion for depression. She has no known history of seizure.    2. Other specified eating disorder - She has a history of bulimia, binge eating, although she denies history of anorexia.  Overall stable since being on Zepbound. Will continue to assess this as needed.    Plan Continue Lexapro 20 mg daily  Continue bupropion 450 mg daily  Next appointment: 8/28 at 1 20 for 20 mins, video   The patient demonstrates the following risk factors for suicide: Chronic risk factors for suicide include: psychiatric disorder of depression, anxiety . Acute risk factors for suicide include: unemployment. Protective factors for this patient include: positive social support, responsibility to others (children, family), coping skills, and hope for the future. Considering these factors, the overall suicide risk at this point appears to be low. Patient is appropriate for outpatient follow up.     Collaboration of Care: Collaboration of Care: Other reviewed notes in Epic  Patient/Guardian was advised Release of Information must be obtained prior to any record release in order to collaborate their care with an outside provider. Patient/Guardian was advised if they have not already done so to contact the registration department to sign all necessary forms in order for [place] to release information regarding their care.   Consent: Patient/Guardian gives verbal consent for treatment and assignment of benefits for services provided during this visit. Patient/Guardian expressed understanding and agreed to proceed.    Neysa Hotter, MD 01/18/2023, 2:33 PM

## 2023-01-16 ENCOUNTER — Telehealth: Payer: Self-pay | Admitting: Pharmacy Technician

## 2023-01-16 NOTE — Telephone Encounter (Signed)
Pharmacy Patient Advocate Encounter  Received notification from CoverMyMeds that prior authorization for [facility] is required/requested.  Started PA in [facility] and submitted for clinical questions. Waiting for them to populate.

## 2023-01-18 ENCOUNTER — Encounter: Payer: Self-pay | Admitting: Psychiatry

## 2023-01-18 ENCOUNTER — Telehealth (INDEPENDENT_AMBULATORY_CARE_PROVIDER_SITE_OTHER): Payer: BLUE CROSS/BLUE SHIELD | Admitting: Psychiatry

## 2023-01-18 DIAGNOSIS — F419 Anxiety disorder, unspecified: Secondary | ICD-10-CM

## 2023-01-18 DIAGNOSIS — F331 Major depressive disorder, recurrent, moderate: Secondary | ICD-10-CM | POA: Diagnosis not present

## 2023-01-18 MED ORDER — BUPROPION HCL ER (XL) 150 MG PO TB24: 150.0000 mg | ORAL_TABLET | Freq: Every day | ORAL | 0 refills | Status: DC

## 2023-01-18 NOTE — Telephone Encounter (Signed)
Therapy d/c. Key in [facility] archived.

## 2023-03-18 NOTE — Progress Notes (Unsigned)
Virtual Visit via Video Note  I connected with Bascom Levels on 03/21/23 at  1:20 PM EDT by a video enabled telemedicine application and verified that I am speaking with the correct person using two identifiers.  Location: Patient: home Provider: office Persons participated in the visit- patient, provider    I discussed the limitations of evaluation and management by telemedicine and the availability of in person appointments. The patient expressed understanding and agreed to proceed.    I discussed the assessment and treatment plan with the patient. The patient was provided an opportunity to ask questions and all were answered. The patient agreed with the plan and demonstrated an understanding of the instructions.   The patient was advised to call back or seek an in-person evaluation if the symptoms worsen or if the condition fails to improve as anticipated.  I provided 15 minutes of non-face-to-face time during this encounter.   Neysa Hotter, MD    [facility] MD/PA/NP OP Progress Note  03/21/2023 1:46 PM JARAH ROSS  MRN:  [id number]  Chief Complaint:  Chief Complaint  Patient presents with   Follow-up   HPI:  This is a follow-up appointment for depression and anxiety.  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  She is able to bring her son to the school, and is able to teach kids.  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  She reports better relationship with her husband.  She also feels good that she is able to financially contribute to some extent.  She reports very close relationship with her children.  Although she feels anxious at times, it has been manageable.  Although she has a little concerned about her husband going out for work on weekends for the next few months, her mood is much better compared to  before.  She sleeps good.  She has been able to maintain her weight since being back on Zepbound.  She denies SI.  She feels comfortable to stay on the current medication regimen.   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 MDD (major depressive disorder), recurrent, in partial remission (HCC)  F33.41     2. Anxiety disorder, unspecified type  F41.9       Past Psychiatric History: Please see initial evaluation for full details. I have reviewed the history. No updates at this time.     Past Medical History:  Past Medical History:  Diagnosis Date   COVID-19    fall 2021   Depression    Dr. Ardelle Anton   Eating  disorder    bulemia   Migraines     Past Surgical History:  Procedure Laterality Date   CESAREAN SECTION N/A 10/07/2016   Procedure: CESAREAN SECTION;  Surgeon: Christeen Douglas, MD;  Location: [facility] ORS;  Service: Obstetrics;  Laterality: N/A;   CESAREAN SECTION N/A 02/24/2020   Procedure: CESAREAN SECTION;  Surgeon: Feliberto Gottron, Ihor Austin, MD;  Location: [facility] OR[facility];  Service: Obstetrics;  Laterality: N/A;   CHOLECYSTECTOMY  2009   LAPAROSCOPIC GASTRIC BANDING  2009   Dr. Terrill Mohr   LAPAROSCOPIC GASTRIC SLEEVE RESECTION  01/2013   TONSILLECTOMY  2006   UPPER GI ENDOSCOPY  2014   hiatial hernia, Dr. Smitty Cords    Family Psychiatric History: Please see initial evaluation for  full details. I have reviewed the history. No updates at this time.     Family History:  Family History  Problem Relation Age of Onset   Depression Mother    High Cholesterol Mother    Anorexia nervosa Mother    Hypertension Mother    ADD / ADHD Brother    Anxiety disorder Brother    Cancer Maternal Grandmother        lung   Cancer Maternal Grandfather        lung   Cancer Paternal Grandmother        Breast   Heart disease Paternal Grandfather    Cancer Paternal Grandfather        renal cancer went to his lungs   Colon cancer Neg Hx    Thyroid cancer Neg Hx     Social History:  Social History   Socioeconomic History   Marital status: Married    Spouse name: Not on file   Number of children: Not on file   Years of education: Not on file   Highest education level: Not on file  Occupational History   Not on file  Tobacco Use   Smoking status: Never   Smokeless tobacco: Never  Substance and Sexual Activity   Alcohol use: No    Comment: Maybe once a month   Drug use: No   Sexual activity: Yes  Other Topics Concern   Not on file  Social History Narrative   Lives in [place] with husband      Work - home      Two children- 4 years, 1 years      Diet - regular, followed bariatric clinic   Social Determinants of Health   Financial Resource Strain: Not on file  Food Insecurity: Not on file  Transportation Needs: Not on file  Physical Activity: Not on file  Stress: Not on file  Social Connections: Unknown (12/05/2021)   Received from [facility]   Social Network    Social Network: Not on file    Allergies:  Allergies  Allergen Reactions   Advil [Ibuprofen] Swelling    Swelling of face and hands   Derma Guard [Protective Adhesive Powder]     Dermabond - urticaria    Metabolic Disorder Labs: Lab Results  Component Value Date   HGBA1C 5.8 07/31/2022   No results found for: "PROLACTIN" Lab Results  Component Value Date   CHOL 194 07/31/2022    TRIG 218.0 (H) 07/31/2022   HDL 44.80 07/31/2022   CHOLHDL 4 07/31/2022   VLDL 43.6 (H) 07/31/2022   LDLCALC 116 (H) 04/11/2021   LDLCALC 86 09/22/2014   Lab Results  Component Value Date   TSH 2.22 04/11/2021   TSH 2.18 02/04/2015  Therapeutic Level Labs: No results found for: "LITHIUM" No results found for: "VALPROATE" No results found for: "CBMZ"  Current Medications: Current Outpatient Medications  Medication Sig Dispense Refill   buPROPion (WELLBUTRIN XL) 150 MG 24 hr tablet Take 1 tablet (150 mg total) by mouth daily. Take total of 450 mg daily. Take along with 300 mg tab 90 tablet 0   buPROPion (WELLBUTRIN XL) 300 MG 24 hr tablet Take 1 tablet (300 mg total) by mouth daily. Take total of 450 mg daily. Take along with 150 mg tab 90 tablet 1   escitalopram (LEXAPRO) 20 MG tablet Take 1 tablet (20 mg total) by mouth daily. 90 tablet 1   tirzepatide (ZEPBOUND) 7.5 MG/0.5ML Pen INJECT 7.5 MG SUBCUTANEOUSLY WEEKLY 2 mL 5   No current facility-administered medications for this visit.     Musculoskeletal: Strength & Muscle Tone:  N/A Gait & Station:  N/A Patient leans: N/A  Psychiatric Specialty Exam: Review of Systems  Psychiatric/Behavioral:  Negative for agitation, behavioral problems, confusion, decreased concentration, dysphoric mood, hallucinations, self-injury, sleep disturbance and suicidal ideas. The patient is nervous/anxious. The patient is not hyperactive.   All other systems reviewed and are negative.   There were no vitals taken for this visit.There is no height or weight on file to calculate BMI.  General Appearance: Fairly Groomed  Eye Contact:  Good  Speech:  Clear and Coherent  Volume:  Normal  Mood:   better  Affect:  Appropriate, Congruent, and calm  Thought Process:  Coherent  Orientation:  Full (Time, Place, and Person)  Thought Content: Logical   Suicidal Thoughts:  No  Homicidal Thoughts:  No  Memory:  Immediate;   Good  Judgement:  Good   Insight:  Good  Psychomotor Activity:  Normal  Concentration:  Concentration: Good and Attention Span: Good  Recall:  Good  Fund of Knowledge: Good  Language: Good  Akathisia:  No  Handed:  Right  AIMS (if indicated): not done  Assets:  Communication Skills Desire for Improvement  ADL's:  Intact  Cognition: WNL  Sleep:  Good   Screenings: GAD-7    Flowsheet Row Office Visit from 12/22/2022 in [facility] Visit from 09/07/2022 in [facility] Office Visit from 07/13/2022 in [facility] Office Visit from 05/22/2022 in [facility] Office Visit from 02/06/2022 in [facility] [employer] [facility]  Total GAD-7 Score 2 5 2 4 3       PHQ2-9    Flowsheet Row Office Visit from 12/22/2022 in [facility] Visit from 10/16/2022 in [facility] [employer] Visit from 09/07/2022 in [facility] Office Visit from 07/13/2022 in [facility] Office Visit from 05/22/2022 in [facility]  PHQ-2 Total Score 2 0 4 2 2   PHQ-9 Total Score 4 -- 13 6 12       Flowsheet Row Office Visit from 07/13/2022 in [facility] Office Visit from 05/22/2022 in [facility]  C-SSRS RISK CATEGORY Error: Question 2 not populated Error: Q3, 4, or 5 should not be populated when Q2 is No        Assessment and Plan:  WYLDA COSGROVE is a 42 y.o. year old female with a history of depression, anxiety, migraine, s/p  bariatric surgery/gastric sleeve in 2010/2014, who presents for follow up appointment for below.   1. MDD (major depressive disorder), recurrent, in  partial remission (HCC) 2. Anxiety disorder, unspecified type Acute stressors include:  Other stressors include: financial strain, marital conflict    History: Sees a therapist  There has been steady improvement in depressive symptoms and anxiety since uptitration of bupropion while she continues to experience self limiting anxiety.  Will continue current dose to target depression, along with Lexapro to target depression and anxiety.    2. Other specified eating disorder - She has a history of bulimia, binge eating, although she denies history of anorexia.  Overall stable since being on Zepbound. Will continue to assess this as needed.    Plan Continue Lexapro 20 mg daily  Continue bupropion 450 mg daily  Next appointment: 10/30 at 1 40 for 20 mins, video   The patient demonstrates the following risk factors for suicide: Chronic risk factors for suicide include: psychiatric disorder of depression, anxiety . Acute risk factors for suicide include: unemployment. Protective factors for this patient include: positive social support, responsibility to others (children, family), coping skills, and hope for the future. Considering these factors, the overall suicide risk at this point appears to be low. Patient is appropriate for outpatient follow up.     Collaboration of Care: Collaboration of Care: Other reviewed notes in Epic  Patient/Guardian was advised Release of Information must be obtained prior to any record release in order to collaborate their care with an outside provider. Patient/Guardian was advised if they have not already done so to contact the registration department to sign all necessary forms in order for [place] to release information regarding their care.   Consent: Patient/Guardian gives verbal consent for treatment and assignment of benefits for services provided during this visit. Patient/Guardian expressed understanding and agreed to proceed.    Neysa Hotter, MD 03/21/2023, 1:46  PM

## 2023-03-21 ENCOUNTER — Encounter: Payer: Self-pay | Admitting: Psychiatry

## 2023-03-21 ENCOUNTER — Telehealth (INDEPENDENT_AMBULATORY_CARE_PROVIDER_SITE_OTHER): Payer: BLUE CROSS/BLUE SHIELD | Admitting: Psychiatry

## 2023-03-21 DIAGNOSIS — F5089 Other specified eating disorder: Secondary | ICD-10-CM | POA: Diagnosis not present

## 2023-03-21 DIAGNOSIS — F3341 Major depressive disorder, recurrent, in partial remission: Secondary | ICD-10-CM | POA: Diagnosis not present

## 2023-03-21 DIAGNOSIS — F419 Anxiety disorder, unspecified: Secondary | ICD-10-CM

## 2023-03-21 MED ORDER — BUPROPION HCL ER (XL) 300 MG PO TB24: 300.0000 mg | ORAL_TABLET | Freq: Every day | ORAL | 1 refills | Status: DC

## 2023-03-21 NOTE — Patient Instructions (Signed)
Continue Lexapro 20 mg daily  Continue bupropion 450 mg daily  Next appointment: 10/30 at 1 40

## 2023-05-16 NOTE — Progress Notes (Deleted)
BH MD/PA/NP OP Progress Note  05/16/2023 5:10 PM Sue Hernandez  MRN:  [id number]  Chief Complaint: No chief complaint on file.  HPI: *** Visit Diagnosis: No diagnosis found.  Past Psychiatric History: Please see initial evaluation for full details. I have reviewed the history. No updates at this time.     Past Medical History:  Past Medical History:  Diagnosis Date   COVID-19    fall 2021   Depression    Dr. Ardelle Anton   Eating disorder    bulemia   Migraines     Past Surgical History:  Procedure Laterality Date   CESAREAN SECTION N/A 10/07/2016   Procedure: CESAREAN SECTION;  Surgeon: Christeen Douglas, MD;  Location: [facility] ORS;  Service: Obstetrics;  Laterality: N/A;   CESAREAN SECTION N/A 02/24/2020   Procedure: CESAREAN SECTION;  Surgeon: Feliberto Gottron, Ihor Austin, MD;  Location: [facility] ORS;  Service: Obstetrics;  Laterality: N/A;   CHOLECYSTECTOMY  2009   LAPAROSCOPIC GASTRIC BANDING  2009   Dr. Terrill Mohr   LAPAROSCOPIC GASTRIC SLEEVE RESECTION  01/2013   TONSILLECTOMY  2006   UPPER GI ENDOSCOPY  2014   hiatial hernia, Dr. Smitty Cords    Family Psychiatric History: Please see initial evaluation for full details. I have reviewed the history. No updates at this time.     Family History:  Family History  Problem Relation Age of Onset   Depression Mother    High Cholesterol Mother    Anorexia nervosa Mother    Hypertension Mother    ADD / ADHD Brother    Anxiety disorder Brother    Cancer Maternal Grandmother        lung   Cancer Maternal Grandfather        lung   Cancer Paternal Grandmother        Breast   Heart disease Paternal Grandfather    Cancer Paternal Grandfather        renal cancer went to his lungs   Colon cancer Neg Hx    Thyroid cancer Neg Hx     Social History:  Social History   Socioeconomic History   Marital status: Married    Spouse name: Not on file   Number of children: Not on file   Years of education: Not on file   Highest education level: Not  on file  Occupational History   Not on file  Tobacco Use   Smoking status: Never   Smokeless tobacco: Never  Substance and Sexual Activity   Alcohol use: No    Comment: Maybe once a month   Drug use: No   Sexual activity: Yes  Other Topics Concern   Not on file  Social History Narrative   Lives in [place] with husband      Work - home      Two children- 4 years, 1 years      Diet - regular, followed bariatric clinic   Social Determinants of Health   Financial Resource Strain: Not on file  Food Insecurity: Not on file  Transportation Needs: Not on file  Physical Activity: Not on file  Stress: Not on file  Social Connections: Unknown (12/05/2021)   [facility]   Social Network    Social Network: Not on file    Allergies:  Allergies  Allergen Reactions   Advil [Ibuprofen] Swelling    Swelling of face and hands   Derma Guard [Protective Adhesive Powder]     Dermabond - urticaria  Metabolic Disorder Labs: Lab Results  Component Value Date   HGBA1C 5.8 07/31/2022   No results found for: "PROLACTIN" Lab Results  Component Value Date   CHOL 194 07/31/2022   TRIG 218.0 (H) 07/31/2022   HDL 44.80 07/31/2022   CHOLHDL 4 07/31/2022   VLDL 43.6 (H) 07/31/2022   LDLCALC 116 (H) 04/11/2021   LDLCALC 86 09/22/2014   Lab Results  Component Value Date   TSH 2.22 04/11/2021   TSH 2.18 02/04/2015    Therapeutic Level Labs: No results found for: "LITHIUM" No results found for: "VALPROATE" No results found for: "CBMZ"  Current Medications: Current Outpatient Medications  Medication Sig Dispense Refill   buPROPion (WELLBUTRIN XL) 150 MG 24 hr tablet Take 1 tablet (150 mg total) by mouth daily. Take total of 450 mg daily. Take along with 300 mg tab 90 tablet 0   buPROPion (WELLBUTRIN XL) 300 MG 24 hr tablet Take 1 tablet (300 mg total) by mouth daily. Take total of 450 mg daily. Take along with 150 mg tab 90 tablet 1    escitalopram (LEXAPRO) 20 MG tablet Take 1 tablet (20 mg total) by mouth daily. 90 tablet 1   tirzepatide (ZEPBOUND) 7.5 MG/0.5ML Pen INJECT 7.5 MG SUBCUTANEOUSLY WEEKLY 2 mL 5   No current facility-administered medications for this visit.     Musculoskeletal: Strength & Muscle Tone:  N/A Gait & Station:  N/A Patient leans: N/A  Psychiatric Specialty Exam: Review of Systems  There were no vitals taken for this visit.There is no height or weight on file to calculate BMI.  General Appearance: {Appearance:22683}  Eye Contact:  {BHH EYE CONTACT:22684}  Speech:  Clear and Coherent  Volume:  Normal  Mood:  {BHH MOOD:22306}  Affect:  {Affect (PAA):22687}  Thought Process:  Coherent  Orientation:  Full (Time, Place, and Person)  Thought Content: Logical   Suicidal Thoughts:  {ST/HT (PAA):22692}  Homicidal Thoughts:  {ST/HT (PAA):22692}  Memory:  Immediate;   Good  Judgement:  {Judgement (PAA):22694}  Insight:  {Insight (PAA):22695}  Psychomotor Activity:  Normal  Concentration:  Concentration: Good and Attention Span: Good  Recall:  Good  Fund of Knowledge: Good  Language: Good  Akathisia:  No  Handed:  Right  AIMS (if indicated): not done  Assets:  Communication Skills Desire for Improvement  ADL's:  Intact  Cognition: WNL  Sleep:  {BHH GOOD/FAIR/POOR:22877}   Screenings: GAD-7    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/22/2022 in [facility] [employer] Visit from 09/07/2022 in [facility] Visit from 07/13/2022 in [facility] Visit from 05/22/2022 in [facility] Office Visit from 02/06/2022 in [facility] [employer] [facility]  Total GAD-7 Score 2 5 2 4 3       PHQ2-9    Flowsheet Row Office Visit from 12/22/2022 in [facility] [employer] Visit from  10/16/2022 in [facility] [employer] Visit from 09/07/2022 in [facility] Office Visit from 07/13/2022 in [facility] Office Visit from 05/22/2022 in [facility]  PHQ-2 Total Score 2 0 4 2 2   PHQ-9 Total Score 4 -- 13 6 12       Flowsheet Row Office Visit from 07/13/2022 in [facility] Office Visit from 05/22/2022 in [facility]  C-SSRS RISK CATEGORY Error: Question 2 not populated Error: Q3, 4, or 5 should not be populated when Q2 is No        Assessment and Plan:  Sue Hernandez is a 42 y.o. year old female with a history of depression, anxiety, migraine, s/p bariatric surgery/gastric sleeve in 2010/2014, who presents for follow up appointment for below.    1. MDD (major depressive disorder), recurrent, in partial remission (HCC) 2. Anxiety disorder, unspecified type Acute stressors include:  Other stressors include: financial strain, marital conflict    History: Sees a therapist  There has been steady improvement in depressive symptoms and anxiety since uptitration of bupropion while she continues to experience self limiting anxiety.  Will continue current dose to target depression, along with Lexapro to target depression and anxiety.    2. Other specified eating disorder - She has a history of bulimia, binge eating, although she denies history of anorexia.  Overall stable since being on Zepbound. Will continue to assess this as needed.    Plan Continue Lexapro 20 mg daily  Continue bupropion 450 mg daily  Next appointment: 10/30 at 1 40 for 20 mins, video   The patient demonstrates the following risk factors for suicide: Chronic risk factors for suicide include: psychiatric disorder of depression, anxiety . Acute risk factors for suicide  include: unemployment. Protective factors for this patient include: positive social support, responsibility to others (children, family), coping skills, and hope for the future. Considering these factors, the overall suicide risk at this point appears to be low. Patient is appropriate for outpatient follow up.     Collaboration of Care: Collaboration of Care: {BH OP Collaboration of Care:21014065}  Patient/Guardian was advised Release of Information must be obtained prior to any record release in order to collaborate their care with an outside provider. Patient/Guardian was advised if they have not already done so to contact the registration department to sign all necessary forms in order for [place] to release information regarding their care.   Consent: Patient/Guardian gives verbal consent for treatment and assignment of benefits for services provided during this visit. Patient/Guardian expressed understanding and agreed to proceed.    Neysa Hotter, MD 05/16/2023, 5:10 PM

## 2023-05-23 ENCOUNTER — Telehealth: Payer: BLUE CROSS/BLUE SHIELD | Admitting: Psychiatry

## 2023-06-01 ENCOUNTER — Other Ambulatory Visit: Payer: Self-pay | Admitting: Psychiatry

## 2023-06-04 NOTE — Telephone Encounter (Signed)
Scheduled for 06/06/23

## 2023-06-04 NOTE — Progress Notes (Unsigned)
Virtual Visit via Video Note  I connected with Sue Hernandez on 06/06/23 at  4:30 PM EST by a video enabled telemedicine application and verified that I am speaking with the correct person using two identifiers.  Location: Patient: home Provider: office Persons participated in the visit- patient, provider    I discussed the limitations of evaluation and management by telemedicine and the availability of in person appointments. The patient expressed understanding and agreed to proceed.    I discussed the assessment and treatment plan with the patient. The patient was provided an opportunity to ask questions and all were answered. The patient agreed with the plan and demonstrated an understanding of the instructions.   The patient was advised to call back or seek an in-person evaluation if the symptoms worsen or if the condition fails to improve as anticipated.  I provided 15 minutes of non-face-to-face time during this encounter.   Neysa Hotter, MD    [facility] MD/PA/NP OP Progress Note  06/06/2023 4:50 PM Sue Hernandez  MRN:  [id number]  Chief Complaint:  Chief Complaint  Patient presents with   Follow-up   HPI:  This is a follow-up appointment for depression, anxiety.  She states that she has been doing well.  Her husband has been out of the house.  He is doing Chief Financial Officer.  He tends to be very busy in the fall.  Although she has been stressed, she states her emotions are good.  Her kids have been sick.  Her son had what appeared to be migraine.  She was very anxious and stressed to see him struggling.  She now feels good that he has a neurologist he can be seen, and has medication which works for him.  She goes on play date on Friday.  She goes to church on weekend.  She enjoys the work, and feels fulfilled.  Although she occasionally feels down, it does not last long.  She sleeps up to 9 hours.  She has fair appetite, being on Zepbound.  She denies SI.  She denies any anxiety  except the time her son was struggling with headache.  Although she is interested in lowering the dose of bupropion in the future, she feels comfortable to stay on the current medication regimen at this time.   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 MDD (major depressive disorder), recurrent, in partial remission (HCC)  F33.41     2. Anxiety disorder, unspecified type  F41.9       Past Psychiatric History: Please see initial evaluation for full details. I have reviewed the history. No updates at this time.     Past Medical History:  Past Medical History:  Diagnosis Date   COVID-19    fall 2021   Depression  Dr. Ardelle Anton   Eating disorder    bulemia   Migraines     Past Surgical History:  Procedure Laterality Date   CESAREAN SECTION N/A 10/07/2016   Procedure: CESAREAN SECTION;  Surgeon: Christeen Douglas, MD;  Location: [facility];  Service: Obstetrics;  Laterality: N/A;   CESAREAN SECTION N/A 02/24/2020   Procedure: CESAREAN SECTION;  Surgeon: Feliberto Gottron, Ihor Austin, MD;  Location: [facility];  Service: Obstetrics;  Laterality: N/A;   CHOLECYSTECTOMY  2009   LAPAROSCOPIC GASTRIC BANDING  2009   Dr. Terrill Mohr   LAPAROSCOPIC GASTRIC SLEEVE RESECTION  01/2013   TONSILLECTOMY  2006   UPPER GI ENDOSCOPY  2014   hiatial hernia, Dr. Smitty Cords    Family Psychiatric History: Please see initial evaluation for full details. I  have reviewed the history. No updates at this time.    Family History:  Family History  Problem Relation Age of Onset   Depression Mother    High Cholesterol Mother    Anorexia nervosa Mother    Hypertension Mother    ADD / ADHD Brother    Anxiety disorder Brother    Cancer Maternal Grandmother        lung   Cancer Maternal Grandfather        lung   Cancer Paternal Grandmother        Breast   Heart disease Paternal Grandfather    Cancer Paternal Grandfather        renal cancer went to his lungs   Colon cancer Neg Hx    Thyroid cancer Neg Hx     Social History:  Social History   Socioeconomic History   Marital status: Married    Spouse name: Not on file   Number of children: Not on file   Years of education: Not on file   Highest education level: Not on file  Occupational History   Not on file  Tobacco Use   Smoking status: Never   Smokeless tobacco: Never  Substance and Sexual Activity   Alcohol use: No    Comment: Maybe once a month   Drug use: No   Sexual activity: Yes  Other Topics Concern   Not on file  Social History Narrative   Lives in [place] with husband      Work - home      Two children- 4 years, 1 years      Diet - regular, followed bariatric clinic   Social Determinants of Health   Financial Resource Strain: Not on file  Food Insecurity: Not on file  Transportation Needs: Not on file  Physical Activity: Not on file  Stress: Not on file  Social Connections: Unknown (12/05/2021)   Received from [facility]   Social Network    Social Network: Not on file    Allergies:  Allergies  Allergen Reactions   Advil [Ibuprofen] Swelling    Swelling of face and hands   Derma Guard [Protective Adhesive Powder]     Dermabond - urticaria    Metabolic Disorder Labs: Lab Results  Component Value Date   HGBA1C 5.8 07/31/2022   No results found for: "PROLACTIN" Lab Results  Component Value Date   CHOL 194 07/31/2022    TRIG 218.0 (H) 07/31/2022   HDL 44.80 07/31/2022   CHOLHDL 4 07/31/2022   VLDL 43.6 (H) 07/31/2022   LDLCALC 116 (H) 04/11/2021   LDLCALC 86 09/22/2014   Lab Results  Component Value Date   TSH 2.22 04/11/2021  TSH 2.18 02/04/2015    Therapeutic Level Labs: No results found for: "LITHIUM" No results found for: "VALPROATE" No results found for: "CBMZ"  Current Medications: Current Outpatient Medications  Medication Sig Dispense Refill   buPROPion (WELLBUTRIN XL) 150 MG 24 hr tablet Take 1 tablet (150 mg total) by mouth daily. Take total of 450 mg daily. Take along with 300 mg tab 90 tablet 0   buPROPion (WELLBUTRIN XL) 300 MG 24 hr tablet Take 1 tablet (300 mg total) by mouth daily. Take total of 450 mg daily. Take along with 150 mg tab 90 tablet 1   escitalopram (LEXAPRO) 20 MG tablet Take 1 tablet (20 mg total) by mouth daily. 90 tablet 1   tirzepatide (ZEPBOUND) 7.5 MG/0.5ML Pen INJECT 7.5 MG SUBCUTANEOUSLY WEEKLY 2 mL 5   No current facility-administered medications for this visit.     Musculoskeletal: Strength & Muscle Tone:  N/A Gait & Station:  N/A Patient leans: N/A  Psychiatric Specialty Exam: Review of Systems  Psychiatric/Behavioral:  Positive for dysphoric mood and sleep disturbance. Negative for agitation, behavioral problems, confusion, decreased concentration, hallucinations, self-injury and suicidal ideas. The patient is nervous/anxious. The patient is not hyperactive.   All other systems reviewed and are negative.   There were no vitals taken for this visit.There is no height or weight on file to calculate BMI.  General Appearance: Well Groomed  Eye Contact:  Good  Speech:  Clear and Coherent  Volume:  Normal  Mood:   good  Affect:  Appropriate, Congruent, and Full Range  Thought Process:  Coherent  Orientation:  Full (Time, Place, and Person)  Thought Content: Logical   Suicidal Thoughts:  No  Homicidal Thoughts:  No  Memory:  Immediate;   Good   Judgement:  Good  Insight:  Good  Psychomotor Activity:  Normal  Concentration:  Concentration: Good and Attention Span: Good  Recall:  Good  Fund of Knowledge: Good  Language: Good  Akathisia:  No  Handed:  Right  AIMS (if indicated): not done  Assets:  Communication Skills Desire for Improvement  ADL's:  Intact  Cognition: WNL  Sleep:  Fair   Screenings: GAD-7    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/22/2022 in [facility] Visit from 09/07/2022 in [facility] Visit from 07/13/2022 in [facility] Visit from 05/22/2022 in [facility] Office Visit from 02/06/2022 in [facility] [employer]  Total GAD-7 Score 2 5 2 4 3       PHQ2-9    Flowsheet Row Office Visit from 12/22/2022 in [facility] [employer] Visit from 10/16/2022 in [facility] [employer] Visit from 09/07/2022 in [facility] Office Visit from 07/13/2022 in [facility] Office Visit from 05/22/2022 in [facility]  PHQ-2 Total Score 2 0 4 2 2   PHQ-9 Total Score 4 -- 13 6 12       Flowsheet Row Office Visit from 07/13/2022 in [facility] Office Visit from 05/22/2022 in [facility]  C-SSRS RISK CATEGORY Error: Question 2 not populated Error: Q3, 4, or 5 should not be populated when Q2 is No        Assessment and Plan:  Sue Hernandez is a 42 y.o. year  old female with a history of depression, anxiety, migraine, s/p bariatric surgery/gastric sleeve in 2010/2014, who presents for follow up appointment for below.   1. MDD (major depressive  disorder), recurrent, in partial remission (HCC) 2. Anxiety disorder, unspecified type Acute stressors include:  Other stressors include: financial strain, marital conflict    History: Sees a therapist  Although she reports occasional down mood, it has been self-limited, and she denies any significant anxiety since uptitration of bupropion.  Will continue current dose of Lexapro to target depression and anxiety, along with bupropion for depression.   2. Other specified eating disorder - She has a history of bulimia, binge eating, although she denies history of anorexia.  Overall stable since being on Zepbound. Will continue to assess this as needed.    Plan Continue Lexapro 20 mg daily  Continue bupropion 450 mg daily  Next appointment: 2/12 at 1 20 for 20 mins, video   The patient demonstrates the following risk factors for suicide: Chronic risk factors for suicide include: psychiatric disorder of depression, anxiety . Acute risk factors for suicide include: unemployment. Protective factors for this patient include: positive social support, responsibility to others (children, family), coping skills, and hope for the future. Considering these factors, the overall suicide risk at this point appears to be low. Patient is appropriate for outpatient follow up.     Collaboration of Care: Collaboration of Care: Other reviewed notes in Epic  Patient/Guardian was advised Release of Information must be obtained prior to any record release in order to collaborate their care with an outside provider. Patient/Guardian was advised if they have not already done so to contact the registration department to sign all necessary forms in order for [place] to release information regarding their care.   Consent: Patient/Guardian gives verbal consent for treatment and assignment of benefits for services provided during this visit. Patient/Guardian expressed understanding and agreed to proceed.    Neysa Hotter,  MD 06/06/2023, 4:50 PM

## 2023-06-06 ENCOUNTER — Telehealth (INDEPENDENT_AMBULATORY_CARE_PROVIDER_SITE_OTHER): Payer: BLUE CROSS/BLUE SHIELD | Admitting: Psychiatry

## 2023-06-06 ENCOUNTER — Encounter: Payer: Self-pay | Admitting: Psychiatry

## 2023-06-06 DIAGNOSIS — F419 Anxiety disorder, unspecified: Secondary | ICD-10-CM | POA: Diagnosis not present

## 2023-06-06 DIAGNOSIS — F3341 Major depressive disorder, recurrent, in partial remission: Secondary | ICD-10-CM | POA: Diagnosis not present

## 2023-06-06 MED ORDER — ESCITALOPRAM OXALATE 20 MG PO TABS: 20.0000 mg | ORAL_TABLET | Freq: Every day | ORAL | 1 refills | Status: DC

## 2023-06-06 NOTE — Patient Instructions (Signed)
Continue Lexapro 20 mg daily  Continue bupropion 450 mg daily  Next appointment: 2/12 at 1 20

## 2023-06-12 ENCOUNTER — Encounter: Payer: Self-pay | Admitting: Family

## 2023-06-14 ENCOUNTER — Other Ambulatory Visit: Payer: Self-pay | Admitting: Family

## 2023-06-14 DIAGNOSIS — E669 Obesity, unspecified: Secondary | ICD-10-CM

## 2023-06-14 MED ORDER — ZEPBOUND 10 MG/0.5ML ~~LOC~~ SOAJ
10.0000 mg | SUBCUTANEOUS | 3 refills | Status: DC
Start: 2023-06-14 — End: 2023-10-29

## 2023-08-22 ENCOUNTER — Ambulatory Visit: Payer: 59 | Admitting: Family

## 2023-08-22 ENCOUNTER — Encounter: Payer: Self-pay | Admitting: Family

## 2023-08-22 VITALS — BP 120/72 | HR 88 | Temp 98.4°F | Ht 63.0 in | Wt 168.1 lb

## 2023-08-22 DIAGNOSIS — Z13220 Encounter for screening for lipoid disorders: Secondary | ICD-10-CM | POA: Diagnosis not present

## 2023-08-22 DIAGNOSIS — Z1231 Encounter for screening mammogram for malignant neoplasm of breast: Secondary | ICD-10-CM | POA: Diagnosis not present

## 2023-08-22 DIAGNOSIS — Z136 Encounter for screening for cardiovascular disorders: Secondary | ICD-10-CM

## 2023-08-22 DIAGNOSIS — E669 Obesity, unspecified: Secondary | ICD-10-CM | POA: Diagnosis not present

## 2023-08-22 LAB — CBC WITH DIFFERENTIAL/PLATELET
Basophils Absolute: 0 10*3/uL (ref 0.0–0.1)
Basophils Relative: 0.5 % (ref 0.0–3.0)
Eosinophils Absolute: 0.1 10*3/uL (ref 0.0–0.7)
Eosinophils Relative: 1.9 % (ref 0.0–5.0)
HCT: 38 % (ref 36.0–46.0)
Hemoglobin: 12.4 g/dL (ref 12.0–15.0)
Lymphocytes Relative: 33 % (ref 12.0–46.0)
Lymphs Abs: 2.3 10*3/uL (ref 0.7–4.0)
MCHC: 32.5 g/dL (ref 30.0–36.0)
MCV: 80 fL (ref 78.0–100.0)
Monocytes Absolute: 0.6 10*3/uL (ref 0.1–1.0)
Monocytes Relative: 9.2 % (ref 3.0–12.0)
Neutro Abs: 3.9 10*3/uL (ref 1.4–7.7)
Neutrophils Relative %: 55.4 % (ref 43.0–77.0)
Platelets: 324 10*3/uL (ref 150.0–400.0)
RBC: 4.76 Mil/uL (ref 3.87–5.11)
RDW: 16.6 % — ABNORMAL HIGH (ref 11.5–15.5)
WBC: 7 10*3/uL (ref 4.0–10.5)

## 2023-08-22 LAB — COMPREHENSIVE METABOLIC PANEL
ALT: 10 U/L (ref 0–35)
AST: 13 U/L (ref 0–37)
Albumin: 4.2 g/dL (ref 3.5–5.2)
Alkaline Phosphatase: 46 U/L (ref 39–117)
BUN: 10 mg/dL (ref 6–23)
CO2: 26 meq/L (ref 19–32)
Calcium: 9.3 mg/dL (ref 8.4–10.5)
Chloride: 106 meq/L (ref 96–112)
Creatinine, Ser: 0.78 mg/dL (ref 0.40–1.20)
GFR: 94.35 mL/min (ref >60.00)
Glucose, Bld: 77 mg/dL (ref 70–99)
Potassium: 4.1 meq/L (ref 3.5–5.1)
Sodium: 138 meq/L (ref 135–145)
Total Bilirubin: 0.5 mg/dL (ref 0.2–1.2)
Total Protein: 7 g/dL (ref 6.0–8.3)

## 2023-08-22 LAB — LIPID PANEL
Cholesterol: 188 mg/dL (ref 0–200)
HDL: 54.9 mg/dL (ref >39.00)
LDL Cholesterol: 117 mg/dL — ABNORMAL HIGH (ref 0–99)
NonHDL: 132.6
Total CHOL/HDL Ratio: 3
Triglycerides: 79 mg/dL (ref 0.0–149.0)
VLDL: 15.8 mg/dL (ref 0.0–40.0)

## 2023-08-22 LAB — VITAMIN D 25 HYDROXY (VIT D DEFICIENCY, FRACTURES): VITD: 31.74 ng/mL (ref 30.00–100.00)

## 2023-08-22 LAB — HEMOGLOBIN A1C: Hgb A1c MFr Bld: 5.7 % (ref 4.6–6.5)

## 2023-08-22 LAB — TSH: TSH: 2.12 u[IU]/mL (ref 0.35–5.50)

## 2023-08-22 NOTE — Patient Instructions (Addendum)
Please schedule mammogram at [facility] imaging.  Let me know if any issues in doing so   I would consider Contrave however this would need in collaboration with psychiatry as Contrave contains bupropion.   I would highly consider metformin    Metformin is used in prediabetes, diabetes, and also for weight loss by decreasing calorie consumption.   It works in a couple of ways by decreasing liver glucose production, decreases intestinal absorption of glucose and improves insulin sensitivity (increases peripheral glucose uptake and utilization).    titrate per below so not to cause any GI upset, if you decide to move forward.    Start metformin XR with one 500mg  tablet at night. After one week, you may increase to two tablets at night ( total of 1000mg ) . The third week, you may take take two tablets at night and one tablet in the morning.  The fourth week, you may take two tablets in the morning ( 1000mg  total) and two tablets at night (1000mg  total). This will bring you to a maximum daily dose of 2000mg /day which is maximum dose.  So you are aware,  you may take ALL 4 tablets of metformin together at the same time if preferable and doesn't cause GI upset. You may take metformin 2000mg  ( four of the 500mg  tablets) together in the morning or at night if you prefer.   Along the way, if you want to increase more slowly, please do as this medication can cause GI discomfort and loose stools which usually get better with time , however some patients find that they can only tolerate a certain dose and cannot increase to maximum dose.

## 2023-08-22 NOTE — Progress Notes (Signed)
Assessment & Plan:  Obesity (BMI 30-39.9) Assessment & Plan: Congratulated patient on weight loss.  She has done exceptionally well on Zepbound 10 mg.  Counseled on risk of compounded medications and online sites. Advised of FDA concern.  Discussed metformin as a potential option if Zepbound is no longer covered.  Discussed side effects and titration of metformin . Contrave may be an alternative however less ideal as patient is currently on bupropion 450 mg daily as managed by psychiatry.  She will let me know how she is doing  Orders: -     VITAMIN D 25 Hydroxy (Vit-D Deficiency, Fractures) -     Hemoglobin A1c -     CBC with Differential/Platelet -     Comprehensive metabolic panel -     TSH  Encounter for screening mammogram for malignant neoplasm of breast -     3D Screening Mammogram, Left and Right; Future  Encounter for lipid screening for cardiovascular disease -     Lipid panel     Return precautions given.   Risks, benefits, and alternatives of the medications and treatment plan prescribed today were discussed, and patient expressed understanding.   Education regarding symptom management and diagnosis given to patient on AVS either electronically or printed.  No follow-ups on file.  Rennie Plowman, FNP  Subjective:    Patient ID: Sue Hernandez, female    DOB: [birth year], 43 y.o.   MRN: [id number]  CC: Sue Hernandez is a 43 y.o. female who presents today for follow up.   HPI: Overall feels well today.  She has been quite pleased with Zepbound 10 mg and weight loss.  She would like to lose approximately 10 more pounds.  Denies constipation, nausea. Insurance does not appear to cover Zepbound going forward.  She may consider alternatives or getting a compound through weight watchers online.   Follow-up with psychiatry next month.       Allergies: Advil [ibuprofen] and Derma guard [protective adhesive powder] Current Outpatient Medications on File Prior  to Visit  Medication Sig Dispense Refill   buPROPion (WELLBUTRIN XL) 150 MG 24 hr tablet Take 1 tablet (150 mg total) by mouth daily. Take total of 450 mg daily. Take along with 300 mg tab 90 tablet 0   buPROPion (WELLBUTRIN XL) 300 MG 24 hr tablet Take 1 tablet (300 mg total) by mouth daily. Take total of 450 mg daily. Take along with 150 mg tab 90 tablet 1   escitalopram (LEXAPRO) 20 MG tablet Take 1 tablet (20 mg total) by mouth daily. 90 tablet 1   tirzepatide (ZEPBOUND) 10 MG/0.5ML Pen Inject 10 mg into the skin once a week. 2 mL 3   No current facility-administered medications on file prior to visit.    Review of Systems  Constitutional:  Negative for chills and fever.  Respiratory:  Negative for cough.   Cardiovascular:  Negative for chest pain and palpitations.  Gastrointestinal:  Negative for nausea and vomiting.      Objective:    BP 120/72   Pulse 88   Temp 98.4 F (36.9 C) (Oral)   Ht 5\' 3"  (1.6 m)   Wt 168 lb 1.6 oz (76.2 kg)   LMP  (LMP Unknown)   SpO2 98%   BMI 29.78 kg/m  BP Readings from Last 3 Encounters:  08/22/23 120/72  12/22/22 120/74  10/16/22 126/78   Wt Readings from Last 3 Encounters:  08/22/23 168 lb 1.6 oz (76.2 kg)  12/22/22 205  lb 6.4 oz (93.2 kg)  10/16/22 203 lb 12.8 oz (92.4 kg)    Physical Exam Vitals reviewed.  Constitutional:      Appearance: She is well-developed.  Eyes:     Conjunctiva/sclera: Conjunctivae normal.  Cardiovascular:     Rate and Rhythm: Normal rate and regular rhythm.     Pulses: Normal pulses.     Heart sounds: Normal heart sounds.  Pulmonary:     Effort: Pulmonary effort is normal.     Breath sounds: Normal breath sounds. No wheezing, rhonchi or rales.  Skin:    General: Skin is warm and dry.  Neurological:     Mental Status: She is alert.  Psychiatric:        Speech: Speech normal.        Behavior: Behavior normal.        Thought Content: Thought content normal.

## 2023-08-22 NOTE — Assessment & Plan Note (Signed)
Congratulated patient on weight loss.  She has done exceptionally well on Zepbound 10 mg.  Counseled on risk of compounded medications and online sites. Advised of FDA concern.  Discussed metformin as a potential option if Zepbound is no longer covered.  Discussed side effects and titration of metformin . Contrave may be an alternative however less ideal as patient is currently on bupropion 450 mg daily as managed by psychiatry.  She will let me know how she is doing

## 2023-08-23 ENCOUNTER — Encounter: Payer: Self-pay | Admitting: Family

## 2023-09-30 ENCOUNTER — Other Ambulatory Visit: Payer: Self-pay | Admitting: Psychiatry

## 2023-10-07 NOTE — Progress Notes (Unsigned)
 Virtual Visit via Video Note  I connected with Sue Hernandez on 10/08/23 at  1:30 PM EDT by a video enabled telemedicine application and verified that I am speaking with the correct person using two identifiers.  Location: Patient: work Provider: office Persons participated in the visit- patient, provider    I discussed the limitations of evaluation and management by telemedicine and the availability of in person appointments. The patient expressed understanding and agreed to proceed.    I discussed the assessment and treatment plan with the patient. The patient was provided an opportunity to ask questions and all were answered. The patient agreed with the plan and demonstrated an understanding of the instructions.   The patient was advised to call back or seek an in-person evaluation if the symptoms worsen or if the condition fails to improve as anticipated.   Neysa Hotter, MD    [facility] MD/PA/NP OP Progress Note  10/08/2023 2:02 PM Sue Hernandez  MRN:  [id number]  Chief Complaint:  Chief Complaint  Patient presents with   Follow-up   HPI:  This is a follow-up appointment for depression and anxiety.  She states that she has been doing well.  The work has been going well.  They were unable to renew release due to late payment over a year ago. They are planning to sign release where her son can stay in the same kindergarten.  Although it is overwhelming, she reports a lot of stress as she knows that they are able to afford this.  She reports worsening in the irritation and anxiety prior to menstrual cycle.  However, it is manageable and she denies concern.  Her appetite has been stable, and she feels comfortable with the current weight since being on Zepbound.  She sleeps up to 7 hours.  She denies feeling depressed.  She denies SI.  She feels comfortable to stay on the current medication regimen.   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 MDD (major depressive disorder), recurrent, in partial remission (HCC)  F33.41     2. Anxiety disorder, unspecified type  F41.9     3. 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 I have reviewed the history. No updates at this time.     Past Medical History:  Past Medical History:  Diagnosis Date   COVID-19    fall 2021   Depression    Dr. Ardelle Anton   Eating disorder    bulemia   Migraines     Past Surgical History:  Procedure Laterality Date   CESAREAN SECTION N/A 10/07/2016   Procedure: CESAREAN SECTION;  Surgeon: Christeen Douglas, MD;  Location: [facility] ORS;  Service: Obstetrics;  Laterality: N/A;  CESAREAN SECTION N/A 02/24/2020   Procedure: CESAREAN SECTION;  Surgeon: Schermerhorn, Ihor Austin, MD;  Location: [facility] ORS;  Service: Obstetrics;  Laterality: N/A;   CHOLECYSTECTOMY  2009   LAPAROSCOPIC GASTRIC BANDING  2009   Dr. Terrill Mohr   LAPAROSCOPIC GASTRIC SLEEVE RESECTION  01/2013   TONSILLECTOMY  2006   UPPER GI ENDOSCOPY  2014   hiatial hernia, Dr. Smitty Cords    Family Psychiatric History: Please see initial evaluation for full details. I have reviewed the history. No updates at this time.     Family History:  Family History  Problem Relation Age of Onset   Depression Mother    High Cholesterol Mother    Anorexia nervosa Mother    Hypertension Mother    ADD / ADHD Brother     Anxiety disorder Brother    Cancer Maternal Grandmother        lung   Cancer Maternal Grandfather        lung   Cancer Paternal Grandmother        Breast   Heart disease Paternal Grandfather    Cancer Paternal Grandfather        renal cancer went to his lungs   Colon cancer Neg Hx    Thyroid cancer Neg Hx     Social History:  Social History   Socioeconomic History   Marital status: Married    Spouse name: Not on file   Number of children: Not on file   Years of education: Not on file   Highest education level: Not on file  Occupational History   Not on file  Tobacco Use   Smoking status: Never   Smokeless tobacco: Never  Substance and Sexual Activity   Alcohol use: No    Comment: Maybe once a month   Drug use: No   Sexual activity: Yes  Other Topics Concern   Not on file  Social History Narrative   Lives in [place] with husband      Work - home      Two children- 4 years, 1 years      Diet - regular, followed bariatric clinic   Social Drivers of Health   Financial Resource Strain: Not on file  Food Insecurity: Not on file  Transportation Needs: Not on file  Physical Activity: Not on file  Stress: Not on file  Social Connections: Unknown (12/05/2021)   Received [facility]   Social Network    Social Network: Not on file    Allergies:  Allergies  Allergen Reactions   Advil [Ibuprofen] Swelling    Swelling of face and hands   Derma Guard [Protective Adhesive Powder]     Dermabond - urticaria    Metabolic Disorder Labs: Lab Results  Component Value Date   HGBA1C 5.7 08/22/2023   No results found for: "PROLACTIN" Lab Results  Component Value Date   CHOL 188 08/22/2023   TRIG 79.0 08/22/2023   HDL 54.90 08/22/2023   CHOLHDL 3 08/22/2023   VLDL 15.8 08/22/2023   LDLCALC 117 (H) 08/22/2023   LDLCALC 116 (H) 04/11/2021   Lab Results  Component Value Date   TSH 2.12 08/22/2023   TSH 2.22 04/11/2021    Therapeutic  Level Labs: No results found for: "LITHIUM" No results found for: "VALPROATE" No results found for: "CBMZ"  Current Medications: Current Outpatient Medications  Medication Sig Dispense Refill   buPROPion (WELLBUTRIN XL) 150 MG 24 hr tablet Take 1 tablet (150 mg total) by  mouth daily. Take total of 450 mg daily. Take along with 300 mg tab 90 tablet 0   buPROPion (WELLBUTRIN XL) 300 MG 24 hr tablet Take 1 tablet (300 mg total) by mouth daily. Take total of 450 mg daily. Take along with 150 mg tab 90 tablet 1   escitalopram (LEXAPRO) 20 MG tablet Take 1 tablet (20 mg total) by mouth daily. 90 tablet 1   tirzepatide (ZEPBOUND) 10 MG/0.5ML Pen Inject 10 mg into the skin once a week. 2 mL 3   No current facility-administered medications for this visit.     Musculoskeletal: Strength & Muscle Tone:  N/A Gait & Station:  N/A Patient leans: N/A  Psychiatric Specialty Exam: Review of Systems  Psychiatric/Behavioral:  Negative for agitation, behavioral problems, confusion, decreased concentration, dysphoric mood, hallucinations, self-injury, sleep disturbance and suicidal ideas. The patient is nervous/anxious. The patient is not hyperactive.   All other systems reviewed and are negative.   There were no vitals taken for this visit.There is no height or weight on file to calculate BMI.  General Appearance: Well Groomed  Eye Contact:  Good  Speech:  Clear and Coherent  Volume:  Normal  Mood:   good  Affect:  Appropriate, Congruent, and Full Range  Thought Process:  Coherent  Orientation:  Full (Time, Place, and Person)  Thought Content: Logical   Suicidal Thoughts:  No  Homicidal Thoughts:  No  Memory:  Immediate;   Good  Judgement:  Good  Insight:  Good  Psychomotor Activity:  Normal  Concentration:  Attention Span: Good  Recall:  Good  Fund of Knowledge: Good  Language: Good  Akathisia:  No  Handed:  Right  AIMS (if indicated): not done  Assets:  Communication Skills Desire for  Improvement  ADL's:  Intact  Cognition: WNL  Sleep:  Fair   Screenings: GAD-7    Garment/textile technologist Visit from 08/22/2023 in [facility] Visit from 12/22/2022 in [facility] Visit from 09/07/2022 in [facility] Visit from 07/13/2022 in [facility] Visit from 05/22/2022 in [facility]  Total GAD-7 Score 0 2 5 2 4       PHQ2-9    Flowsheet Row Office Visit from 08/22/2023 in [facility] Visit from 12/22/2022 in [facility] Visit from 10/16/2022 in [facility] Visit from 09/07/2022 in [facility] Office Visit from 07/13/2022 in [facility]  PHQ-2 Total Score 0 2 0 4 2  PHQ-9 Total Score 0 4 -- 13 6      Flowsheet Row Office Visit from 07/13/2022 in [facility] Office Visit from 05/22/2022 in [facility]  C-SSRS RISK CATEGORY Error: Question 2 not populated Error: Q3, 4, or 5 should not be populated when Q2 is No        Assessment and Plan:  Sue Hernandez is a 43 y.o. year old female with a history of depression, anxiety, migraine, s/p bariatric surgery/gastric sleeve in 2010/2014, who presents for follow up appointment for below.   1. MDD (major depressive disorder), recurrent, in partial remission (HCC) 2. Anxiety disorder, unspecified type Acute stressors include:  Other stressors include: financial strain, marital conflict  History:  Although she reports worsening in irritability and anxiety prior to menstrual cycle, she denies any other concern  about her mood.  Will continue current dose of Lexapro to target depression and anxiety, along with bupropion for depression.  Noted that she reports interest in tapering down bupropion at the next visit.  This medication was up titrated back in May; will consider this tapering if her mood continues to be stable.   3. Other specified eating disorder - She has a history of bulimia, binge eating, although she denies history of anorexia.   Stable since being on Zepbound.  Will continue to assess as needed.    Plan Continue Lexapro 20 mg daily  Continue bupropion 450 mg daily - she declined a refill Next appointment: 5/14 at 1 pm for 20 mins, video   The patient demonstrates the following risk factors for suicide: Chronic risk factors for suicide include: psychiatric disorder of depression, anxiety . Acute risk factors for suicide include: unemployment. Protective factors for this patient include: positive social support, responsibility to others (children, family), coping skills, and hope for the future. Considering these factors, the overall suicide risk at this point appears to be low. Patient is appropriate for outpatient follow up.       Collaboration of Care: Collaboration of Care: Other reviewed notes in Epic  Patient/Guardian was advised Release of Information must be obtained prior to any record release in order to collaborate their care with an outside provider. Patient/Guardian was advised if they have not already done so to contact the registration department to sign all necessary forms in order for [place] to release information regarding their care.   Consent: Patient/Guardian gives verbal consent for treatment and assignment of benefits for services provided during this visit. Patient/Guardian expressed understanding and agreed to proceed.    Neysa Hotter, MD 10/08/2023, 2:02 PM

## 2023-10-08 ENCOUNTER — Encounter: Payer: Self-pay | Admitting: Psychiatry

## 2023-10-08 ENCOUNTER — Telehealth (INDEPENDENT_AMBULATORY_CARE_PROVIDER_SITE_OTHER): Admitting: Psychiatry

## 2023-10-08 DIAGNOSIS — F3341 Major depressive disorder, recurrent, in partial remission: Secondary | ICD-10-CM | POA: Diagnosis not present

## 2023-10-08 DIAGNOSIS — F419 Anxiety disorder, unspecified: Secondary | ICD-10-CM | POA: Diagnosis not present

## 2023-10-08 DIAGNOSIS — F5089 Other specified eating disorder: Secondary | ICD-10-CM

## 2023-10-08 NOTE — Patient Instructions (Signed)
 1. Continue Lexapro 20 mg daily  2. Continue bupropion 450 mg daily  3. Next appointment: 5/14 at 1 pm

## 2023-10-28 ENCOUNTER — Encounter: Payer: Self-pay | Admitting: Family

## 2023-10-29 ENCOUNTER — Other Ambulatory Visit: Payer: Self-pay | Admitting: Family

## 2023-10-29 ENCOUNTER — Other Ambulatory Visit: Payer: Self-pay

## 2023-10-29 DIAGNOSIS — E669 Obesity, unspecified: Secondary | ICD-10-CM

## 2023-10-29 MED ORDER — ZEPBOUND 10 MG/0.5ML ~~LOC~~ SOAJ: 10.0000 mg | SUBCUTANEOUS | 3 refills | Status: DC

## 2023-11-01 ENCOUNTER — Other Ambulatory Visit: Payer: Self-pay | Admitting: Family

## 2023-11-01 DIAGNOSIS — E669 Obesity, unspecified: Secondary | ICD-10-CM

## 2023-11-01 MED ORDER — ZEPBOUND 10 MG/0.5ML ~~LOC~~ SOAJ
10.0000 mg | SUBCUTANEOUS | 3 refills | Status: AC
Start: 2023-11-01 — End: ?

## 2023-11-01 NOTE — Telephone Encounter (Signed)
 Per note from pt, she is obtaining medication outside of normal pharmacy benefits. No PA is needed, Please sign off on rx in this encounter as PA team is unable to resolve RX requests. Thank you

## 2023-11-30 NOTE — Progress Notes (Unsigned)
 Virtual Visit via Video Note  I connected with Sue Hernandez on 12/05/23 at  1:00 PM EDT by a video enabled telemedicine application and verified that I am speaking with the correct person using two identifiers.  Location: Patient: home Provider: home office Persons participated in the visit- patient, provider    I discussed the limitations of evaluation and management by telemedicine and the availability of in person appointments. The patient expressed understanding and agreed to proceed.    I discussed the assessment and treatment plan with the patient. The patient was provided an opportunity to ask questions and all were answered. The patient agreed with the plan and demonstrated an understanding of the instructions.   The patient was advised to call back or seek an in-person evaluation if the symptoms worsen or if the condition fails to improve as anticipated.   Todd Fossa, MD    [facility] MD/PA/NP OP Progress Note  12/05/2023 1:25 PM Sue Hernandez  MRN:  [id number]  Chief Complaint:  Chief Complaint  Patient presents with   Follow-up   HPI:  This is a follow-up appointment for depression and anxiety.  She states that she moved in to the new place.  It has been going better, although she is trying to get situated.  She will have a summer break soon, and she is looking forward to it.  She states that the mood has been good.  Although she may feel irritable at times around menstrual cycle, it subsides after the day.  She is still on Zepbound .  She has to pay $650.  She was advised by her provider to consider metformin.  She has been doing good otherwise.  She denies feeling depressed.  She denies change in sleep.  She denies SI.  She denies anxiety or panic attacks.  She enjoys being with her family.  She denies concern at this time.   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 MDD (major depressive disorder), recurrent, in partial remission (HCC)  F33.41     2. Anxiety disorder, unspecified type  F41.9       Past Psychiatric History: Please see initial evaluation for full details. I have reviewed the history. No updates at this time.     Past Medical History:  Past Medical History:  Diagnosis Date   COVID-19    fall 2021   Depression    Dr. Clydia Dart   Eating disorder    bulemia   Migraines     Past Surgical History:  Procedure Laterality Date   CESAREAN SECTION N/A 10/07/2016   Procedure: CESAREAN SECTION;  Surgeon: Prescilla Brod, MD;  Location: [facility] ORS;  Service: Obstetrics;  Laterality: N/A;   CESAREAN SECTION N/A 02/24/2020   Procedure: CESAREAN SECTION;  Surgeon: Madelene Schanz, Joselyn Nicely, MD;  Location: [facility] ORS;  Service: Obstetrics;  Laterality: N/A;   CHOLECYSTECTOMY  2009   LAPAROSCOPIC GASTRIC BANDING  2009   Dr. Carlye Child   LAPAROSCOPIC GASTRIC SLEEVE RESECTION  01/2013  TONSILLECTOMY  2006   UPPER GI ENDOSCOPY  2014   hiatial hernia, Dr. Therese Flash    Family Psychiatric History: Please see initial evaluation for full details. I have reviewed the history. No updates at this time.     Family History:  Family History  Problem Relation Age of Onset   Depression Mother    High Cholesterol Mother    Anorexia nervosa Mother    Hypertension Mother    ADD / ADHD Brother    Anxiety disorder Brother    Cancer Maternal Grandmother        lung   Cancer Maternal Grandfather        lung   Cancer Paternal Grandmother        Breast   Heart disease Paternal Grandfather    Cancer Paternal Grandfather        renal cancer went to  his lungs   Colon cancer Neg Hx    Thyroid  cancer Neg Hx     Social History:  Social History   Socioeconomic History   Marital status: Married    Spouse name: Not on file   Number of children: Not on file   Years of education: Not on file   Highest education level: Not on file  Occupational History   Not on file  Tobacco Use   Smoking status: Never   Smokeless tobacco: Never  Substance and Sexual Activity   Alcohol use: No    Comment: Maybe once a month   Drug use: No   Sexual activity: Yes  Other Topics Concern   Not on file  Social History Narrative   Lives in [place] with husband      Work - home      Two children- 4 years, 1 years      Diet - regular, followed bariatric clinic   Social Drivers of Health   Financial Resource Strain: Not on file  Food Insecurity: Not on file  Transportation Needs: Not on file  Physical Activity: Not on file  Stress: Not on file  Social Connections: Unknown (12/05/2021)   [facility]   Social Network    Social Network: Not on file    Allergies:  Allergies  Allergen Reactions   Advil [Ibuprofen] Swelling    Swelling of face and hands   Derma Guard [Protective Adhesive Powder]     Dermabond - urticaria    Metabolic Disorder Labs: Lab Results  Component Value Date   HGBA1C 5.7 08/22/2023   No results found for: "PROLACTIN" Lab Results  Component Value Date   CHOL 188 08/22/2023   TRIG 79.0 08/22/2023   HDL 54.90 08/22/2023   CHOLHDL 3 08/22/2023   VLDL 15.8 08/22/2023   LDLCALC 117 (H) 08/22/2023   LDLCALC 116 (H) 04/11/2021   Lab Results  Component Value Date   TSH 2.12 08/22/2023   TSH 2.22 04/11/2021    Therapeutic Level Labs: No results found for: "LITHIUM" No results found for: "VALPROATE" No results found for: "CBMZ"  Current Medications: Current Outpatient Medications  Medication Sig Dispense Refill   buPROPion  (WELLBUTRIN  XL) 300 MG 24 hr tablet Take 1 tablet  (300 mg total) by mouth daily. 90 tablet 0   escitalopram  (LEXAPRO ) 20 MG tablet Take 1 tablet (20 mg total) by mouth daily. 90 tablet 1   tirzepatide  (ZEPBOUND ) 10 MG/0.5ML Pen Inject 10 mg into the skin once a week. 2 mL 3   No current facility-administered medications for this  visit.     Musculoskeletal: Strength & Muscle Tone: N/A Gait & Station: N/A Patient leans: N/A  Psychiatric Specialty Exam: Review of Systems  Psychiatric/Behavioral:  Negative for agitation, behavioral problems, confusion, decreased concentration, dysphoric mood, hallucinations, self-injury, sleep disturbance and suicidal ideas. The patient is nervous/anxious. The patient is not hyperactive.   All other systems reviewed and are negative.   There were no vitals taken for this visit.There is no height or weight on file to calculate BMI.  General Appearance: Well Groomed  Eye Contact:  Good  Speech:  Clear and Coherent  Volume:  Normal  Mood:  good  Affect:  Appropriate, Congruent, and Full Range  Thought Process:  Coherent  Orientation:  Full (Time, Place, and Person)  Thought Content: Logical   Suicidal Thoughts:  No  Homicidal Thoughts:  No  Memory:  Immediate;   Good  Judgement:  Good  Insight:  Good  Psychomotor Activity:  Normal  Concentration:  Concentration: Good and Attention Span: Good  Recall:  Good  Fund of Knowledge: Good  Language: Good  Akathisia:  No  Handed:  Right  AIMS (if indicated): not done  Assets:  Communication Skills Desire for Improvement  ADL's:  Intact  Cognition: WNL  Sleep:  Good   Screenings: GAD-7    Flowsheet Row Office Visit from 08/22/2023 in [facility] Visit from 12/22/2022 in [facility] Visit from 09/07/2022 in [facility] Visit from 07/13/2022 in [facility] Visit  from 05/22/2022 in [facility]  Total GAD-7 Score 0 2 5 2 4       PHQ2-9    Flowsheet Row Office Visit from 08/22/2023 in [facility] Visit from 12/22/2022 in [facility] Visit from 10/16/2022 in [facility] [employer] Visit from 09/07/2022 in [facility] Office Visit from 07/13/2022 in [facility]  PHQ-2 Total Score 0 2 0 4 2  PHQ-9 Total Score 0 4 -- 13 6      Flowsheet Row Office Visit from 07/13/2022 in [facility] Office Visit from 05/22/2022 in [facility]  C-SSRS RISK CATEGORY Error: Question 2 not populated Error: Q3, 4, or 5 should not be populated when Q2 is No        Assessment and Plan:  Sue Hernandez is a 43 y.o. year old female with a history of depression, anxiety, migraine, s/p bariatric surgery/gastric sleeve in 2010/2014, s/p COVID, who presents for follow up appointment for below.   1. MDD (major depressive disorder), recurrent, in partial remission (HCC) 2. Anxiety disorder, unspecified type Acute stressors include:  Other stressors include: financial strain, marital conflict    History: depression and eating disorder since age 72, worsening in depression since the birth of her second child in 2021  She reports improvement in irritability, anxiety while menstrual cycle, and she denies any other significant mood symptoms since the last visit.  She has been on the current medication regimen since last May as maintenance treatment.  She reports interest in tapering down bupropion  at this time.  Will lower the dose of bupropion  while monitoring any relapse in her mood symptoms.  Will continue Lexapro  to target depression and anxiety.  3.  Other specified eating disorder - She has a history of bulimia, binge eating, although she denies history of anorexia.   Stable since being on Zepbound .  Will continue to assess as needed.    Plan Continue Lexapro  20 mg daily  Decrease bupropion  300 mg daily  - since 11/2023 Next appointment: 7/30 at 1 pm for 20 mins, video   The patient demonstrates the following risk factors for suicide: Chronic risk factors for suicide include: psychiatric disorder of depression, anxiety . Acute risk factors for suicide include: unemployment. Protective factors for this patient include: positive social support, responsibility to others (children, family), coping skills, and hope for the future. Considering these factors, the overall suicide risk at this point appears to be low. Patient is appropriate for outpatient follow up.       Collaboration of Care: Collaboration of Care: Other reviewed notes in Epic  Patient/Guardian was advised Release of Information must be obtained prior to any record release in order to collaborate their care with an outside provider. Patient/Guardian was advised if they have not already done so to contact the registration department to sign all necessary forms in order for us  to release information regarding their care.   Consent: Patient/Guardian gives verbal consent for treatment and assignment of benefits for services provided during this visit. Patient/Guardian expressed understanding and agreed to proceed.    Todd Fossa, MD 12/05/2023, 1:25 PM

## 2023-12-05 ENCOUNTER — Telehealth (INDEPENDENT_AMBULATORY_CARE_PROVIDER_SITE_OTHER): Payer: PRIVATE HEALTH INSURANCE | Admitting: Psychiatry

## 2023-12-05 ENCOUNTER — Encounter: Payer: Self-pay | Admitting: Psychiatry

## 2023-12-05 DIAGNOSIS — F419 Anxiety disorder, unspecified: Secondary | ICD-10-CM

## 2023-12-05 DIAGNOSIS — F3341 Major depressive disorder, recurrent, in partial remission: Secondary | ICD-10-CM

## 2023-12-05 MED ORDER — BUPROPION HCL ER (XL) 300 MG PO TB24: 300.0000 mg | ORAL_TABLET | Freq: Every day | ORAL | 0 refills | Status: DC

## 2023-12-05 MED ORDER — ESCITALOPRAM OXALATE 20 MG PO TABS
20.0000 mg | ORAL_TABLET | Freq: Every day | ORAL | 1 refills | Status: DC
Start: 2023-12-05 — End: 2024-04-16

## 2023-12-05 NOTE — Patient Instructions (Signed)
 Continue Lexapro  20 mg daily  Decrease bupropion  300 mg daily   Next appointment: 7/30 at 1 pm

## 2024-01-08 ENCOUNTER — Encounter (HOSPITAL_BASED_OUTPATIENT_CLINIC_OR_DEPARTMENT_OTHER): Admitting: Radiology

## 2024-01-08 DIAGNOSIS — Z1231 Encounter for screening mammogram for malignant neoplasm of breast: Secondary | ICD-10-CM

## 2024-01-15 ENCOUNTER — Encounter (HOSPITAL_BASED_OUTPATIENT_CLINIC_OR_DEPARTMENT_OTHER): Admitting: Radiology

## 2024-01-15 DIAGNOSIS — Z1231 Encounter for screening mammogram for malignant neoplasm of breast: Secondary | ICD-10-CM

## 2024-01-17 ENCOUNTER — Encounter: Payer: Self-pay | Admitting: Family

## 2024-01-18 ENCOUNTER — Other Ambulatory Visit: Payer: Self-pay | Admitting: Family

## 2024-01-18 MED ORDER — ONDANSETRON 4 MG PO TBDP
4.0000 mg | ORAL_TABLET | Freq: Three times a day (TID) | ORAL | 0 refills | Status: AC | PRN
Start: 2024-01-18 — End: ?

## 2024-02-05 ENCOUNTER — Encounter (HOSPITAL_BASED_OUTPATIENT_CLINIC_OR_DEPARTMENT_OTHER): Admitting: Radiology

## 2024-02-05 DIAGNOSIS — Z1231 Encounter for screening mammogram for malignant neoplasm of breast: Secondary | ICD-10-CM

## 2024-02-16 NOTE — Progress Notes (Unsigned)
 Virtual Visit via Video Note  I connected with Sue Hernandez on 02/20/24 at  1:00 PM EDT by a video enabled telemedicine application and verified that I am speaking with the correct person using two identifiers.  Location: Patient: home Provider: home office Persons participated in the visit- patient, provider    I discussed the limitations of evaluation and management by telemedicine and the availability of in person appointments. The patient expressed understanding and agreed to proceed.  I discussed the assessment and treatment plan with the patient. The patient was provided an opportunity to ask questions and all were answered. The patient agreed with the plan and demonstrated an understanding of the instructions.   The patient was advised to call back or seek an in-person evaluation if the symptoms worsen or if the condition fails to improve as anticipated.    Sue Sleet, MD   [facility] MD/PA/NP OP Progress Note  02/20/2024 1:27 PM Sue Hernandez  MRN:  [id number]  Chief Complaint:  Chief Complaint  Patient presents with   Follow-up   HPI:  This is a follow-up appointment for depression and anxiety.  She states that she has felt decrease in medicine lately.  She has been more emotional, although she does not think it is irrational.  She went to see her grandmother, who is 42 year old in [place] .  It was incredibly emotional visit.  She has not been able to visit her as much as she wishes due to financial strain.  She also reports financial issues.  It has been traumatic to her parent issues related to her husband's small business.  She states that her husband has toxic positivity, and he does not understand why she has been feeling this way.  She has noticed that she has been a little more times, yelling a little more.  Although it is not crazy, she feels more vulnerable.  She has fair sleep, although she feels fatigued, which she attributes to emotional stress.  Although she  may do some binge eating, it is nothing compared to before.  She denies SI, HI, hallucinations.  She agrees with the plans as outlined below.    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 MDD (major depressive disorder), recurrent episode, mild (HCC)  F33.0     2. Anxiety disorder, unspecified type  F41.9       Past Psychiatric History: Please see initial evaluation for full details. I have reviewed the history. No updates at this time.     Past Medical History:  Past Medical History:  Diagnosis Date   COVID-19    fall 2021   Depression    Dr. Gershon   Eating disorder    bulemia   Migraines     Past Surgical History:  Procedure Laterality Date   CESAREAN SECTION N/A 10/07/2016   Procedure: CESAREAN SECTION;  Surgeon: Heather Penton, MD;  Location: [facility] OR[facility];  Service: Obstetrics;  Laterality: N/A;   CESAREAN SECTION N/A 02/24/2020  Procedure: CESAREAN SECTION;  Surgeon: Schermerhorn, Debby PARAS, MD;  Location: [facility];  Service: Obstetrics;  Laterality: N/A;   CHOLECYSTECTOMY  2009   LAPAROSCOPIC GASTRIC BANDING  2009   Dr. Margaretta   LAPAROSCOPIC GASTRIC SLEEVE RESECTION  01/2013   TONSILLECTOMY  2006   UPPER GI ENDOSCOPY  2014   hiatial hernia, Dr. Wolm    Family Psychiatric History: Please see initial evaluation for full details. I have reviewed the history. No updates at this time.     Family History:  Family History  Problem Relation Age of Onset   Depression Mother    High Cholesterol Mother    Anorexia nervosa Mother    Hypertension Mother     ADD / ADHD Brother    Anxiety disorder Brother    Cancer Maternal Grandmother        lung   Cancer Maternal Grandfather        lung   Cancer Paternal Grandmother        Breast   Heart disease Paternal Grandfather    Cancer Paternal Grandfather        renal cancer went to his lungs   Colon cancer Neg Hx    Thyroid  cancer Neg Hx     Social History:  Social History   Socioeconomic History   Marital status: Married    Spouse name: Not on file   Number of children: Not on file   Years of education: Not on file   Highest education level: Not on file  Occupational History   Not on file  Tobacco Use   Smoking status: Never   Smokeless tobacco: Never  Substance and Sexual Activity   Alcohol use: No    Comment: Maybe once a month   Drug use: No   Sexual activity: Yes  Other Topics Concern   Not on file  Social History Narrative   Lives in [place] with husband      Work - home      Two children- 4 years, 1 years      Diet - regular, followed bariatric clinic   Social Drivers of Health   Financial Resource Strain: Not on file  Food Insecurity: Not on file  Transportation Needs: Not on file  Physical Activity: Not on file  Stress: Not on file  Social Connections: Unknown (12/05/2021)   Received from [employer]   Social Network    Social Network: Not on file    Allergies:  Allergies  Allergen Reactions   Advil [Ibuprofen] Swelling    Swelling of face and hands   Derma Guard [Protective Adhesive Powder]     Dermabond - urticaria    Metabolic Disorder Labs: Lab Results  Component Value Date   HGBA1C 5.7 08/22/2023   No results found for: PROLACTIN Lab Results  Component Value Date   CHOL 188 08/22/2023   TRIG 79.0 08/22/2023   HDL 54.90 08/22/2023   CHOLHDL 3 08/22/2023   VLDL 15.8 08/22/2023   LDLCALC 117 (H) 08/22/2023   LDLCALC 116 (H) 04/11/2021   Lab Results  Component Value Date   TSH 2.12 08/22/2023   TSH 2.22 04/11/2021     Therapeutic Level Labs: No results found for: LITHIUM No results found for: VALPROATE No results found for: CBMZ  Current Medications: Current Outpatient Medications  Medication Sig Dispense Refill   buPROPion  (WELLBUTRIN  XL) 150 MG 24 hr tablet Take 1 tablet (150 mg total) by mouth daily. Total of 450 mg daily, take  along with 300 mg tab 90 tablet 0   [START ON 03/04/2024] buPROPion  (WELLBUTRIN  XL) 300 MG 24 hr tablet Take 1 tablet (300 mg total) by mouth daily. 90 tablet 0   escitalopram  (LEXAPRO ) 20 MG tablet Take 1 tablet (20 mg total) by mouth daily. 90 tablet 1   ondansetron  (ZOFRAN -ODT) 4 MG disintegrating tablet Take 1 tablet (4 mg total) by mouth every 8 (eight) hours as needed for nausea or vomiting. 20 tablet 0   tirzepatide  (ZEPBOUND ) 10 MG/0.5ML Pen Inject 10 mg into the skin once a week. 2 mL 3   No current facility-administered medications for this visit.     Musculoskeletal: Strength & Muscle Tone: N/A Gait & Station: N/A Patient leans: N/A  Psychiatric Specialty Exam: Review of Systems  Psychiatric/Behavioral:  Positive for dysphoric mood. Negative for agitation, behavioral problems, confusion, decreased concentration, hallucinations, self-injury, sleep disturbance and suicidal ideas. The patient is nervous/anxious. The patient is not hyperactive.   All other systems reviewed and are negative.   There were no vitals taken for this visit.There is no height or weight on file to calculate BMI.  General Appearance: Well Groomed  Eye Contact:  Good  Speech:  Clear and Coherent  Volume:  Normal  Mood:  Anxious  Affect:  Appropriate, Congruent, and slightly tense  Thought Process:  Coherent  Orientation:  Full (Time, Place, and Person)  Thought Content: Logical   Suicidal Thoughts:  No  Homicidal Thoughts:  No  Memory:  Immediate;   Good  Judgement:  Good  Insight:  Good  Psychomotor Activity:  Normal  Concentration:  Concentration: Good and Attention  Span: Good  Recall:  Good  Fund of Knowledge: Good  Language: Good  Akathisia:  No  Handed:  Right  AIMS (if indicated): not done  Assets:  Communication Skills Desire for Improvement  ADL's:  Intact  Cognition: WNL  Sleep:  Fair   Screenings: GAD-7    Garment/textile technologist Visit from 08/22/2023 in [facility] Visit from 12/22/2022 in [facility] [employer] Visit from 09/07/2022 in [facility] Visit from 07/13/2022 in [facility] Office Visit from 05/22/2022 in [facility]  Total GAD-7 Score 0 2 5 2 4    PHQ2-9    Flowsheet Row Office Visit from 08/22/2023 in [facility] Visit from 12/22/2022 in [facility] [employer] Visit from 10/16/2022 in [facility] [employer] Visit from 09/07/2022 in [facility] Office Visit from 07/13/2022 in [facility]  PHQ-2 Total Score 0 2 0 4 2  PHQ-9 Total Score 0 4 -- 13 6   Flowsheet Row Office Visit from 07/13/2022 in [facility] Office Visit from 05/22/2022 in [facility]  C-SSRS RISK CATEGORY Error: Question 2 not populated Error: Q3, 4, or 5 should not be populated when Q2 is No     Assessment and Plan:  GILDA ABBOUD is a 43 y.o. year old female with a history of depression, anxiety, migraine, s/p bariatric surgery/gastric sleeve in 2010/2014, s/p COVID, who presents for follow up appointment for below.    1. MDD (major depressive disorder), recurrent episode, mild (HCC) 2. Anxiety disorder, unspecified type History: depression and eating disorder since age  19, worsening in depression since the birth of her second child in 2021   There has been worsening in depressive symptoms and anxiety in the context of financial strain, and seeing her grandmother in [place] , which coincided with tapering down bupropion  after completing maintenance treatment for over 1 year.  She is receptive to uptitrate bupropion  the original dose to mitigate risk of relapse in her mood symptoms.  Will continue Lexapro  to target depression and anxiety, along with higher dose of bupropion .    3. Other specified eating disorder - She has a history of bulimia, binge eating, although she denies history of anorexia.   Overall stable since being on Zepbound .  Will continue to assess as needed.    Plan Continue Lexapro  20 mg daily  Increase bupropion  450 mg daily- uptitrated back 01/2024 Next appointment: 9/24 at 1 20 for 20 mins, video   The patient demonstrates the following risk factors for suicide: Chronic risk factors for suicide include: psychiatric disorder of depression, anxiety . Acute risk factors for suicide include: unemployment. Protective factors for this patient include: positive social support, responsibility to others (children, family), coping skills, and hope for the future. Considering these factors, the overall suicide risk at this point appears to be low. Patient is appropriate for outpatient follow up.     Collaboration of Care: Collaboration of Care: Other reviewed notes in Epic  Patient/Guardian was advised Release of Information must be obtained prior to any record release in order to collaborate their care with an outside provider. Patient/Guardian was advised if they have not already done so to contact the registration department to sign all necessary forms in order for us  to release information regarding their care.   Consent: Patient/Guardian gives verbal consent for treatment and assignment of benefits for services provided during this visit.  Patient/Guardian expressed understanding and agreed to proceed.    Sue Sleet, MD 02/20/2024, 1:27 PM

## 2024-02-20 ENCOUNTER — Telehealth: Payer: PRIVATE HEALTH INSURANCE | Admitting: Psychiatry

## 2024-02-20 ENCOUNTER — Encounter: Payer: Self-pay | Admitting: Psychiatry

## 2024-02-20 DIAGNOSIS — F419 Anxiety disorder, unspecified: Secondary | ICD-10-CM | POA: Diagnosis not present

## 2024-02-20 DIAGNOSIS — F330 Major depressive disorder, recurrent, mild: Secondary | ICD-10-CM | POA: Diagnosis not present

## 2024-02-20 MED ORDER — BUPROPION HCL ER (XL) 300 MG PO TB24: 300.0000 mg | ORAL_TABLET | Freq: Every day | ORAL | 0 refills | Status: DC

## 2024-02-20 MED ORDER — BUPROPION HCL ER (XL) 150 MG PO TB24: 150.0000 mg | ORAL_TABLET | Freq: Every day | ORAL | 0 refills | Status: DC

## 2024-02-20 NOTE — Patient Instructions (Signed)
 Continue Lexapro  20 mg daily  Increase bupropion  450 mg daily Next appointment: 9/24 at 1 20

## 2024-04-13 NOTE — Progress Notes (Unsigned)
 Virtual Visit via Video Note  I connected with Sue Hernandez on 04/16/24 at  1:20 PM EDT by a video enabled telemedicine application and verified that I am speaking with the correct person using two identifiers.  Location: Patient: home Provider: home office Persons participated in the visit- patient, provider    I discussed the limitations of evaluation and management by telemedicine and the availability of in person appointments. The patient expressed understanding and agreed to proceed.  I discussed the assessment and treatment plan with the patient. The patient was provided an opportunity to ask questions and all were answered. The patient agreed with the plan and demonstrated an understanding of the instructions.   The patient was advised to call back or seek an in-person evaluation if the symptoms worsen or if the condition fails to improve as anticipated.  Katheren Sleet, MD    [facility] MD/PA/NP OP Progress Note  04/16/2024 1:38 PM Sue Hernandez  MRN:  [id number]  Chief Complaint:  Chief Complaint  Patient presents with   Follow-up   HPI:  This is a follow-up appointment for depression, anxiety.  She states that she has been doing good.  She is now back to school, teaching.  She keeps herself busy.  Although her husband will travel soon on the every other weekend, she thinks things are in control.  She reports better communication with them.  They have been trying to work on parenting for her 44-year-old, who started to play video games.  She has been trying to take time for herself.  She enjoys going out with her girlfriends.  She thinks the school has been easier, and she likes her Geophysicist/field seismologist.  She sleeps well.  She denies feeling depressed.  She denies anxiety.  She denies SI, HI, hallucinations.  She agrees with the plans as outlined.   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 MDD (major depressive disorder), recurrent, in partial remission  F33.41     2. Anxiety disorder, unspecified type  F41.9       Past Psychiatric History: Please see initial evaluation for full details. I have reviewed the history. No updates at this time.     Past Medical History:  Past Medical History:  Diagnosis Date   COVID-19    fall 2021   Depression    Dr. Gershon   Eating disorder    bulemia   Migraines     Past Surgical History:  Procedure Laterality Date   CESAREAN SECTION N/A 10/07/2016   Procedure: CESAREAN SECTION;  Surgeon: Heather Penton, MD;  Location: [facility] ORS;  Service: Obstetrics;  Laterality: N/A;   CESAREAN SECTION N/A 02/24/2020   Procedure: CESAREAN SECTION;  Surgeon: Lovetta, Debby PARAS, MD;  Location: [facility] ORS;  Service: Obstetrics;  Laterality: N/A;   CHOLECYSTECTOMY  2009   LAPAROSCOPIC GASTRIC BANDING  2009   Dr. Margaretta   LAPAROSCOPIC GASTRIC SLEEVE RESECTION  01/2013   TONSILLECTOMY  2006   UPPER GI ENDOSCOPY  2014   hiatial hernia, Dr. Wolm    Family Psychiatric History: Please see initial evaluation for full details. I have reviewed the history. No updates at this time.     Family History:  Family History  Problem Relation Age of Onset   Depression Mother    High Cholesterol Mother    Anorexia nervosa Mother    Hypertension Mother    ADD / ADHD Brother    Anxiety disorder Brother    Cancer Maternal Grandmother        lung   Cancer Maternal Grandfather        lung   Cancer Paternal Grandmother        Breast   Heart disease Paternal Grandfather    Cancer Paternal Grandfather        renal cancer  went to his lungs   Colon cancer Neg Hx    Thyroid  cancer Neg Hx     Social History:  Social History   Socioeconomic History   Marital status: Married    Spouse name: Not on file   Number of children: Not on file   Years of education: Not on file   Highest education level: Not on file  Occupational History   Not on file  Tobacco Use   Smoking status: Never   Smokeless tobacco: Never  Substance and Sexual Activity   Alcohol use: No    Comment: Maybe once a month   Drug use: No   Sexual activity: Yes  Other Topics Concern   Not on file  Social History Narrative   Lives in [place] with husband      Work - home      Two children- 4 years, 1 years      Diet - regular, followed bariatric clinic   Social Drivers of Health   Financial Resource Strain: Not on file  Food Insecurity: Not on file  Transportation Needs: Not on file  Physical Activity: Not on file  Stress: Not on file  Social Connections: Unknown (12/05/2021)   Received from [employer]   Social Network    Social Network: Not on file    Allergies:  Allergies  Allergen Reactions   Advil [Ibuprofen] Swelling    Swelling of face and hands   Derma Guard [Protective Adhesive Powder]     Dermabond - urticaria    Metabolic Disorder Labs: Lab Results  Component Value Date   HGBA1C 5.7 08/22/2023   No results found for: PROLACTIN Lab Results  Component Value Date   CHOL 188 08/22/2023   TRIG 79.0 08/22/2023   HDL 54.90 08/22/2023   CHOLHDL 3 08/22/2023   VLDL 15.8 08/22/2023   LDLCALC 117 (H) 08/22/2023   LDLCALC 116 (H) 04/11/2021   Lab Results  Component Value Date   TSH 2.12 08/22/2023   TSH 2.22 04/11/2021    Therapeutic Level Labs: No results found for: LITHIUM No results found for: VALPROATE No results found for: CBMZ  Current Medications: Current Outpatient Medications  Medication Sig Dispense Refill   [START ON 05/20/2024] buPROPion  (WELLBUTRIN  XL) 150 MG 24 hr  tablet Take 1 tablet (150 mg total) by mouth daily. Total of 450 mg daily, take along with 300 mg tab 90 tablet 0   [START ON 06/02/2024] buPROPion  (WELLBUTRIN  XL) 300 MG 24 hr tablet Take 1 tablet (300 mg total) by mouth daily. 90 tablet 0   [START ON 06/02/2024] escitalopram  (LEXAPRO ) 20 MG tablet Take 1 tablet (20 mg total) by mouth  daily. 90 tablet 1   ondansetron  (ZOFRAN -ODT) 4 MG disintegrating tablet Take 1 tablet (4 mg total) by mouth every 8 (eight) hours as needed for nausea or vomiting. 20 tablet 0   tirzepatide  (ZEPBOUND ) 10 MG/0.5ML Pen Inject 10 mg into the skin once a week. 2 mL 3   No current facility-administered medications for this visit.     Musculoskeletal: Strength & Muscle Tone: N/A Gait & Station: N/A Patient leans: N/A  Psychiatric Specialty Exam: Review of Systems  Psychiatric/Behavioral: Negative.    All other systems reviewed and are negative.   There were no vitals taken for this visit.There is no height or weight on file to calculate BMI.  General Appearance: Well Groomed  Eye Contact:  Good  Speech:  Clear and Coherent  Volume:  Normal  Mood:  good  Affect:  Appropriate, Congruent, and calm  Thought Process:  Coherent  Orientation:  Full (Time, Place, and Person)  Thought Content: Logical   Suicidal Thoughts:  No  Homicidal Thoughts:  No  Memory:  Immediate;   Good  Judgement:  Good  Insight:  Good  Psychomotor Activity:  Normal  Concentration:  Concentration: Good and Attention Span: Good  Recall:  Good  Fund of Knowledge: Good  Language: Good  Akathisia:  No  Handed:  Right  AIMS (if indicated): not done  Assets:  Communication Skills Desire for Improvement  ADL's:  Intact  Cognition: WNL  Sleep:  Good   Screenings: GAD-7    Flowsheet Row Office Visit from 08/22/2023 in [facility] Visit from 12/22/2022 in [facility] Visit from 09/07/2022  in [facility] Visit from 07/13/2022 in [facility] Office Visit from 05/22/2022 in [facility]  Total GAD-7 Score 0 2 5 2 4    PHQ2-9    Flowsheet Row Office Visit from 08/22/2023 in [facility] Visit from 12/22/2022 in [facility] Visit from 10/16/2022 in [facility] Visit from 09/07/2022 in [facility] Office Visit from 07/13/2022 in [facility]  PHQ-2 Total Score 0 2 0 4 2  PHQ-9 Total Score 0 4 -- 13 6   Flowsheet Row Office Visit from 07/13/2022 in [facility] Office Visit from 05/22/2022 in [facility]  C-SSRS RISK CATEGORY Error: Question 2 not populated Error: Q3, 4, or 5 should not be populated when Q2 is No     Assessment and Plan:  Sue Hernandez is a 43 y.o. year old female with a history of depression, anxiety, migraine, s/p bariatric surgery/gastric sleeve in 2010/2014, s/p COVID, who presents for follow up appointment for below.   1. MDD (major depressive disorder), recurrent, in partial remission 2. Anxiety disorder, unspecified type History: depression and eating disorder since age 27, worsening in depression since the birth of her second child in 2021, relapse in depression when tapering down bupropion  after >1 year of maintenance treatment She reports improvement in depressive symptoms and anxiety since restarting the original dose of bupropion .  Will continue current medication regimen.  Will continue Lexapro  to target depression and anxiety, and bupropion  as adjunctive treatment for depression.    3. Other specified eating disorder - She has a  history  of bulimia, binge eating, although she denies history of anorexia.   Overall stable since being on Zepbound .  Will continue to assess as needed.    Plan Continue Lexapro  20 mg daily  Continue bupropion  450 mg daily- uptitrated back 01/2024 Next appointment: 12/10 at 1 pm for 20 mins, video   The patient demonstrates the following risk factors for suicide: Chronic risk factors for suicide include: psychiatric disorder of depression, anxiety . Acute risk factors for suicide include: unemployment. Protective factors for this patient include: positive social support, responsibility to others (children, family), coping skills, and hope for the future. Considering these factors, the overall suicide risk at this point appears to be low. Patient is appropriate for outpatient follow up.     Collaboration of Care: Collaboration of Care: Other reviewed notes in Epic  Patient/Guardian was advised Release of Information must be obtained prior to any record release in order to collaborate their care with an outside provider. Patient/Guardian was advised if they have not already done so to contact the registration department to sign all necessary forms in order for us  to release information regarding their care.   Consent: Patient/Guardian gives verbal consent for treatment and assignment of benefits for services provided during this visit. Patient/Guardian expressed understanding and agreed to proceed.    Katheren Sleet, MD 04/16/2024, 1:38 PM

## 2024-04-16 ENCOUNTER — Encounter: Payer: Self-pay | Admitting: Psychiatry

## 2024-04-16 ENCOUNTER — Telehealth: Payer: PRIVATE HEALTH INSURANCE | Admitting: Psychiatry

## 2024-04-16 DIAGNOSIS — F3341 Major depressive disorder, recurrent, in partial remission: Secondary | ICD-10-CM | POA: Diagnosis not present

## 2024-04-16 DIAGNOSIS — F419 Anxiety disorder, unspecified: Secondary | ICD-10-CM

## 2024-04-16 MED ORDER — BUPROPION HCL ER (XL) 300 MG PO TB24: 300.0000 mg | ORAL_TABLET | Freq: Every day | ORAL | 0 refills | Status: DC

## 2024-04-16 MED ORDER — ESCITALOPRAM OXALATE 20 MG PO TABS: 20.0000 mg | ORAL_TABLET | Freq: Every day | ORAL | 1 refills | Status: AC

## 2024-04-16 MED ORDER — BUPROPION HCL ER (XL) 150 MG PO TB24: 150.0000 mg | ORAL_TABLET | Freq: Every day | ORAL | 0 refills | Status: DC

## 2024-04-16 NOTE — Patient Instructions (Signed)
 Continue Lexapro  20 mg daily  Continue bupropion  450 mg daily Next appointment: 12/10 at 1 pm

## 2024-04-29 NOTE — Progress Notes (Signed)
    Assessment & Plan:  1. Pneumonia due to COVID-19 virus (Primary) - AMB REFERRAL FOR DME   Patient Instructions  You on Symbicort  160/4.5, 2 inhalations twice a day.   Can discontinue your.  Will call adapt to cancel that order.   We will see you in follow-up in 2 months time.  Please note: late entry documentation due to logistical difficulties during COVID-19 pandemic. This note is filed for information purposes only, and is not intended to be used for billing, nor does it represent the full scope/nature of the visit in question. Please see any associated scanned media linked to date of encounter for additional pertinent information.  Subjective:    HPI: Sue Hernandez is a 43 y.o. female presenting to the pulmonology clinic on 03/09/2020 with report of: Hospitalization Follow-up (sob with stairs.  cough and sob.  cream colored phlem.)     Outpatient Encounter Medications as of 03/09/2020  Medication Sig   [EXPIRED] ascorbic acid  (VITAMIN C) 500 MG tablet Take 1 tablet (500 mg total) by mouth daily.   [EXPIRED] zinc  sulfate 220 (50 Zn) MG capsule Take 1 capsule (220 mg total) by mouth daily.   [DISCONTINUED] cyclobenzaprine (FLEXERIL) 5 MG tablet Take 5 mg by mouth at bedtime. (Patient not taking: Reported on 12/23/2020)   [DISCONTINUED] escitalopram  (LEXAPRO ) 10 MG tablet Take 10 mg by mouth daily.   [DISCONTINUED] ipratropium (ATROVENT  HFA) 17 MCG/ACT inhaler Inhale 2 puffs into the lungs every 4 (four) hours as needed for wheezing. (Patient not taking: Reported on 12/23/2020)   [DISCONTINUED] Prenatal Vit-Fe Fumarate-FA (PRENATAL MULTIVITAMIN) TABS tablet Take 1 tablet by mouth daily at 12 noon. (Patient not taking: Reported on 12/23/2020)   [DISCONTINUED] budesonide -formoterol  (SYMBICORT ) 160-4.5 MCG/ACT inhaler Inhale 2 puffs into the lungs 2 (two) times daily. (Patient not taking: Reported on 12/23/2020)   [DISCONTINUED] predniSONE  (STERAPRED UNI-PAK 21 TAB) 10 MG (21) TBPK  tablet Start 60 mg po daily, taper 10 mg po daily until done (Patient not taking: No sig reported)   No facility-administered encounter medications on file as of 03/09/2020.      Objective:   Vitals:   03/09/20 1045  BP: 100/70  Pulse: (!) 104  Temp: (!) 97.1 F (36.2 C)  Height: 5' 3 (1.6 m)  Weight: 226 lb 3.2 oz (102.6 kg)  SpO2: 95%  TempSrc: Temporal  BMI (Calculated): 40.08     Physical exam documentation is limited by delayed entry of information.

## 2024-06-28 NOTE — Progress Notes (Deleted)
 [facility] MD/PA/NP OP Progress Note  06/28/2024 10:51 PM Sue Hernandez  MRN:  [id number]  Chief Complaint: No chief complaint on file.  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  Past Psychiatric History: Please see initial evaluation for full details. I have reviewed the history. No updates at this time.     Past Medical History:  Past Medical History:  Diagnosis Date   COVID-19    fall 2021   Depression    Dr. Gershon   Eating disorder    bulemia   Migraines     Past Surgical History:  Procedure Laterality Date   CESAREAN SECTION N/A 10/07/2016   Procedure: CESAREAN SECTION;  Surgeon: Heather Penton, MD;  Location: [facility] ORS;  Service: Obstetrics;  Laterality: N/A;   CESAREAN SECTION N/A 02/24/2020   Procedure: CESAREAN SECTION;  Surgeon: Lovetta, Debby PARAS, MD;  Location: [facility] ORS;  Service: Obstetrics;  Laterality: N/A;   CHOLECYSTECTOMY  2009   LAPAROSCOPIC GASTRIC BANDING  2009   Dr. Margaretta   LAPAROSCOPIC GASTRIC SLEEVE RESECTION  01/2013   TONSILLECTOMY  2006   UPPER GI ENDOSCOPY  2014   hiatial hernia, Dr. Wolm    Family Psychiatric History: Please see initial evaluation for full details. I have reviewed the history. No updates at this time.     Family History:  Family History  Problem Relation Age of Onset   Depression Mother    High Cholesterol Mother    Anorexia nervosa Mother    Hypertension Mother    ADD / ADHD Brother    Anxiety disorder Brother    Cancer Maternal Grandmother        lung   Cancer Maternal Grandfather         lung   Cancer Paternal Grandmother        Breast   Heart disease Paternal Grandfather    Cancer Paternal Grandfather        renal cancer went to his lungs   Colon cancer Neg Hx    Thyroid  cancer Neg Hx     Social History:  Social History   Socioeconomic History   Marital status: Married    Spouse name: Not on file   Number of children: Not on file   Years of education: Not on file   Highest education level: Not on file  Occupational History   Not on file  Tobacco Use   Smoking status: Never   Smokeless tobacco: Never  Substance and Sexual Activity   Alcohol use: No    Comment: Maybe once a month   Drug use: No   Sexual activity: Yes  Other Topics Concern   Not on file  Social History Narrative   Lives in [place] with husband      Work - home      Two children- 4 years, 1 years      Diet - regular, followed bariatric clinic   Social Drivers of Health   Financial Resource Strain: Not  on file  Food Insecurity: Not on file  Transportation Needs: Not on file  Physical Activity: Not on file  Stress: Not on file  Social Connections: Unknown (12/05/2021)   [facility]   Social Network    Social Network: Not on file    Allergies:  Allergies  Allergen Reactions   Advil [Ibuprofen] Swelling    Swelling of face and hands   Derma Guard [Protective Adhesive Powder]     Dermabond - urticaria    Metabolic Disorder Labs: Lab Results  Component Value Date   HGBA1C 5.7 08/22/2023   No results found for: PROLACTIN Lab Results  Component Value Date   CHOL 188 08/22/2023   TRIG 79.0 08/22/2023   HDL 54.90 08/22/2023   CHOLHDL 3 08/22/2023   VLDL 15.8 08/22/2023   LDLCALC 117 (H) 08/22/2023   LDLCALC 116 (H) 04/11/2021   Lab Results  Component Value Date   TSH 2.12 08/22/2023   TSH 2.22 04/11/2021    Therapeutic Level Labs: No results found for: LITHIUM No results found for: VALPROATE No results found for:  CBMZ  Current Medications: Current Outpatient Medications  Medication Sig Dispense Refill   buPROPion  (WELLBUTRIN  XL) 150 MG 24 hr tablet Take 1 tablet (150 mg total) by mouth daily. Total of 450 mg daily, take along with 300 mg tab 90 tablet 0   buPROPion  (WELLBUTRIN  XL) 300 MG 24 hr tablet Take 1 tablet (300 mg total) by mouth daily. 90 tablet 0   escitalopram  (LEXAPRO ) 20 MG tablet Take 1 tablet (20 mg total) by mouth daily. 90 tablet 1   ondansetron  (ZOFRAN -ODT) 4 MG disintegrating tablet Take 1 tablet (4 mg total) by mouth every 8 (eight) hours as needed for nausea or vomiting. 20 tablet 0   tirzepatide  (ZEPBOUND ) 10 MG/0.5ML Pen Inject 10 mg into the skin once a week. 2 mL 3   No current facility-administered medications for this visit.     Musculoskeletal: Strength & Muscle Tone: N/A Gait & Station: N/A Patient leans: N/A  Psychiatric Specialty Exam: Review of Systems  There were no vitals taken for this visit.There is no height or weight on file to calculate BMI.  General Appearance: {Appearance:22683}  Eye Contact:  {BHH EYE CONTACT:22684}  Speech:  Clear and Coherent  Volume:  Normal  Mood:  {BHH MOOD:22306}  Affect:  {Affect (PAA):22687}  Thought Process:  Coherent  Orientation:  Full (Time, Place, and Person)  Thought Content: Logical   Suicidal Thoughts:  {ST/HT (PAA):22692}  Homicidal Thoughts:  {ST/HT (PAA):22692}  Memory:  Immediate;   Good  Judgement:  {Judgement (PAA):22694}  Insight:  {Insight (PAA):22695}  Psychomotor Activity:  Normal  Concentration:  Concentration: Good and Attention Span: Good  Recall:  Good  Fund of Knowledge: Good  Language: Good  Akathisia:  No  Handed:  Right  AIMS (if indicated): not done  Assets:  Communication Skills Desire for Improvement  ADL's:  Intact  Cognition: WNL  Sleep:  {BHH GOOD/FAIR/POOR:22877}   Screenings: GAD-7    Garment/textile Technologist Visit from 08/22/2023 in [facility] Visit from 12/22/2022 in [facility] Visit from 09/07/2022 in [facility] Visit from 07/13/2022 in [facility] Office Visit from 05/22/2022 in [facility]  Total GAD-7 Score 0 2 5 2 4    PHQ2-9    Flowsheet Row Office Visit  from 08/22/2023 in [facility] Visit from 12/22/2022 in [facility] Visit from 10/16/2022 in [facility] Visit from 09/07/2022 in [facility] Office Visit from 07/13/2022 in [facility]  PHQ-2 Total Score 0 2 0 4 2  PHQ-9 Total Score 0 4 -- 13 6   Flowsheet Row Office Visit from 07/13/2022 in [facility] Office Visit from 05/22/2022 in [facility]  C-SSRS RISK CATEGORY Error: Question 2 not populated Error: Q3, 4, or 5 should not be populated when Q2 is No     Assessment and Plan:  Sue Hernandez is a 43 y.o. year old female with a history of depression, anxiety, migraine, s/p bariatric surgery/gastric sleeve in 2010/2014, s/p COVID, who presents for follow up appointment for below.    1. MDD (major depressive disorder), recurrent, in partial remission 2. Anxiety disorder, unspecified type History: depression and eating disorder since age 46, worsening in depression since the birth of her second child in 2021, relapse in depression when tapering down bupropion  after >1 year of maintenance treatment She reports improvement in depressive symptoms and anxiety since restarting the original dose of bupropion .  Will continue current medication regimen.  Will continue Lexapro  to target  depression and anxiety, and bupropion  as adjunctive treatment for depression.    3. Other specified eating disorder - She has a history of bulimia, binge eating, although she denies history of anorexia.   Overall stable since being on Zepbound .  Will continue to assess as needed.    Plan Continue Lexapro  20 mg daily  Continue bupropion  450 mg daily- uptitrated back 01/2024 Next appointment: 12/10 at 1 pm for 20 mins, video   The patient demonstrates the following risk factors for suicide: Chronic risk factors for suicide include: psychiatric disorder of depression, anxiety . Acute risk factors for suicide include: unemployment. Protective factors for this patient include: positive social support, responsibility to others (children, family), coping skills, and hope for the future. Considering these factors, the overall suicide risk at this point appears to be low. Patient is appropriate for outpatient follow up.   Collaboration of Care: Collaboration of Care: {BH OP Collaboration of Care:21014065}  Patient/Guardian was advised Release of Information must be obtained prior to any record release in order to collaborate their care with an outside provider. Patient/Guardian was advised if they have not already done so to contact the registration department to sign all necessary forms in order for us  to release information regarding their care.   Consent: Patient/Guardian gives verbal consent for treatment and assignment of benefits for services provided during this visit. Patient/Guardian expressed understanding and agreed to proceed.    Katheren Sleet, MD 06/28/2024, 10:51 PM

## 2024-07-02 ENCOUNTER — Telehealth: Payer: PRIVATE HEALTH INSURANCE | Admitting: Psychiatry

## 2024-07-18 ENCOUNTER — Other Ambulatory Visit (HOSPITAL_BASED_OUTPATIENT_CLINIC_OR_DEPARTMENT_OTHER): Payer: Self-pay | Admitting: Family

## 2024-07-18 DIAGNOSIS — Z1231 Encounter for screening mammogram for malignant neoplasm of breast: Secondary | ICD-10-CM

## 2024-08-09 NOTE — Progress Notes (Unsigned)
 Virtual Visit via Video Note  I connected with Elysia K Johal on 08/13/24 at  3:00 PM EST by a video enabled telemedicine application and verified that I am speaking with the correct person using two identifiers.  Location: Patient: home Provider: home office Persons participated in the visit- patient, provider    I discussed the limitations of evaluation and management by telemedicine and the availability of in person appointments. The patient expressed understanding and agreed to proceed.    I discussed the assessment and treatment plan with the patient. The patient was provided an opportunity to ask questions and all were answered. The patient agreed with the plan and demonstrated an understanding of the instructions.   The patient was advised to call back or seek an in-person evaluation if the symptoms worsen or if the condition fails to improve as anticipated.    Katheren Sleet, MD    [facility] MD/PA/NP OP Progress Note  08/13/2024 3:24 PM MARIAHA ELLINGTON  MRN:  [id number]  Chief Complaint:  Chief Complaint  Patient presents with   Follow-up   HPI:  - she is seen last in Sept.  This is a follow-up appointment for depression, anxiety.  She states that she is just back from [place] .  She was seeing her grandmother, who is in hospice care.  Leaving her was tough.  She reports occasional insomnia as she tends to think about her.  She also has found out that they need to move out from the house by April.  Although she acknowledges stress, she thinks she will be able to handle it as they did a year ago.  She states that her emotion is worn out due to the current condition and with her grandmother.  Her energy is good otherwise.  She denies change in appetite and has been on Zepbound .  She denies SI, hallucinations.  She feels comfortable to stay on the current medication regimen at this time.   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 MDD (major depressive disorder), recurrent, in partial remission  F33.41     2. Anxiety disorder, unspecified type  F41.9       Past Psychiatric History: Please see initial evaluation for full details. I have reviewed the history. No updates at this time.     Past Medical History:  Past Medical History:  Diagnosis Date   COVID-19    fall 2021   Depression    Dr. Gershon   Eating disorder    bulemia   Migraines     Past Surgical History:  Procedure Laterality Date   CESAREAN SECTION N/A 10/07/2016   Procedure: CESAREAN SECTION;  Surgeon: Heather Penton, MD;  Location: [facility] ORS;  Service: Obstetrics;  Laterality: N/A;   CESAREAN SECTION N/A 02/24/2020   Procedure: CESAREAN SECTION;  Surgeon: Lovetta, Debby PARAS, MD;  Location: [facility] ORS;  Service: Obstetrics;  Laterality: N/A;   CHOLECYSTECTOMY  2009   LAPAROSCOPIC GASTRIC BANDING  2009   Dr.  Eanic   LAPAROSCOPIC GASTRIC SLEEVE RESECTION  01/2013   TONSILLECTOMY  2006   UPPER GI ENDOSCOPY  2014   hiatial hernia, Dr. Wolm    Family Psychiatric History: Please see initial evaluation for full details. I have reviewed the history. No updates at this time.     Family History:  Family History  Problem Relation Age of Onset   Depression Mother    High Cholesterol Mother    Anorexia nervosa Mother    Hypertension Mother    ADD / ADHD Brother    Anxiety disorder Brother    Cancer Maternal Grandmother        lung   Cancer Maternal Grandfather        lung   Cancer Paternal Grandmother        Breast   Heart disease Paternal Grandfather    Cancer  Paternal Grandfather        renal cancer went to his lungs   Colon cancer Neg Hx    Thyroid  cancer Neg Hx     Social History:  Social History   Socioeconomic History   Marital status: Married    Spouse name: Not on file   Number of children: Not on file   Years of education: Not on file   Highest education level: Not on file  Occupational History   Not on file  Tobacco Use   Smoking status: Never   Smokeless tobacco: Never  Substance and Sexual Activity   Alcohol use: No    Comment: Maybe once a month   Drug use: No   Sexual activity: Yes  Other Topics Concern   Not on file  Social History Narrative   Lives in [place] with husband      Work - home      Two children- 4 years, 1 years      Diet - regular, followed bariatric clinic   Social Drivers of Health   Tobacco Use: Low Risk (08/13/2024)   Patient History    Smoking Tobacco Use: Never    Smokeless Tobacco Use: Never    Passive Exposure: Not on file  Financial Resource Strain: Not on file  Food Insecurity: Not on file  Transportation Needs: Not on file  Physical Activity: Not on file  Stress: Not on file  Social Connections: Unknown (12/05/2021)   Received from [facility]   Social Network    Social Network: Not on file  Depression (PHQ2-9): Low Risk (08/22/2023)   Depression (PHQ2-9)    PHQ-2 Score: 0  Alcohol Screen: Not on file  Housing: Not on file  Utilities: Not on file  Health Literacy: Not on file    Allergies: Allergies[1]  Metabolic Disorder Labs: Lab Results  Component Value Date   HGBA1C 5.7 08/22/2023   No results found for: PROLACTIN Lab Results  Component Value Date   CHOL 188 08/22/2023   TRIG 79.0 08/22/2023   HDL 54.90 08/22/2023   CHOLHDL 3 08/22/2023   VLDL 15.8 08/22/2023   LDLCALC 117 (H) 08/22/2023   LDLCALC 116 (H) 04/11/2021   Lab Results  Component Value Date   TSH 2.12 08/22/2023   TSH 2.22 04/11/2021    Therapeutic Level Labs: No results found  for: LITHIUM No results found for: VALPROATE No results found for: CBMZ  Current Medications: Current Outpatient Medications  Medication Sig Dispense Refill   buPROPion  (WELLBUTRIN  XL) 150 MG 24 hr tablet Take 1 tablet (150 mg total) by mouth daily. Total of 450  mg daily, take along with 300 mg tab 90 tablet 0   buPROPion  (WELLBUTRIN  XL) 300 MG 24 hr tablet Take 1 tablet (300 mg total) by mouth daily. 90 tablet 0   escitalopram  (LEXAPRO ) 20 MG tablet Take 1 tablet (20 mg total) by mouth daily. 90 tablet 1   ondansetron  (ZOFRAN -ODT) 4 MG disintegrating tablet Take 1 tablet (4 mg total) by mouth every 8 (eight) hours as needed for nausea or vomiting. 20 tablet 0   tirzepatide  (ZEPBOUND ) 10 MG/0.5ML Pen Inject 10 mg into the skin once a week. 2 mL 3   No current facility-administered medications for this visit.     Musculoskeletal: Strength & Muscle Tone: N/A Gait & Station: N/A Patient leans: N/A  Psychiatric Specialty Exam: Review of Systems  There were no vitals taken for this visit.There is no height or weight on file to calculate BMI.  General Appearance: Well Groomed  Eye Contact:  Good  Speech:  Clear and Coherent  Volume:  Normal  Mood:  good  Affect:  Appropriate, Congruent, and Full Range  Thought Process:  Coherent  Orientation:  Full (Time, Place, and Person)  Thought Content: Logical   Suicidal Thoughts:  No  Homicidal Thoughts:  No  Memory:  Immediate;   Good  Judgement:  Good  Insight:  Good  Psychomotor Activity:  Normal  Concentration:  Concentration: Good and Attention Span: Good  Recall:  Good  Fund of Knowledge: Good  Language: Good  Akathisia:  No  Handed:  Right  AIMS (if indicated): not done  Assets:  Communication Skills Desire for Improvement  ADL's:  Intact  Cognition: WNL  Sleep:  Fair   Screenings: GAD-7    Garment/textile Technologist Visit from 08/22/2023 in [facility] Visit from 12/22/2022  in [facility] from 09/07/20[facility] Visit from 07/13/2022 in [facility] Visit from 05/22/2022 in [facility]  Total GAD-7 Score 0 2 5 2 4    PHQ2-9    Flowsheet Row Office Visit from 08/22/2023 in [facility] Visit from 12/22/2022 in [facility] Visit from 10/16/2022 in [facility] Visit from 09/07/2022 in [facility] Visit from 07/13/2022 in [facility]  PHQ-2 Total Score 0 2 0 4 2  PHQ-9 Total Score 0 4 -- 13 6   Flowsheet Row Office Visit from 07/13/2022 in [facility] Office Visit from 05/22/2022 in [facility]  C-SSRS RISK CATEGORY Error: Question 2 not populated Error: Q3, 4, or 5 should not be populated when Q2 is No     Assessment and Plan:  PHILIS DOKE is a 43  year old female with a history of depression, anxiety, migraine, s/p bariatric surgery/gastric sleeve in 2010/2014, s/p COVID, who presents for follow up appointment for below.   1. MDD (major depressive disorder), recurrent, in partial remission 2. Anxiety disorder, unspecified type History: depression and eating disorder since age 61, worsening in depression since the birth of her second child in 2021, relapse in depression when tapering down bupropion  after >1 year of maintenance treatment  She reports anxiety and occasional insomnia in the context of stress related to her grandmother,  who is in hospice care.  However, things are manageable and she denies significant concern at this time.  Will continue Lexapro  to target  depression and anxiety, along with bupropion  as adjunctive treatment for depression.    3. Other specified eating disorder - She has a history of bulimia, binge eating, although she denies history of anorexia.   Overall stable since being on Zepbound .  Will continue to assess as needed.    Plan Continue Lexapro  20 mg daily  Continue bupropion  450 mg daily- uptitrated back 01/2024 Next appointment: 3/11 at 2 pm, video   The patient demonstrates the following risk factors for suicide: Chronic risk factors for suicide include: psychiatric disorder of depression, anxiety . Acute risk factors for suicide include: unemployment. Protective factors for this patient include: positive social support, responsibility to others (children, family), coping skills, and hope for the future. Considering these factors, the overall suicide risk at this point appears to be low. Patient is appropriate for outpatient follow up.     Collaboration of Care: Collaboration of Care: Other reviewed notes in Epic  Patient/Guardian was advised Release of Information must be obtained prior to any record release in order to collaborate their care with an outside provider. Patient/Guardian was advised if they have not already done so to contact the registration department to sign all necessary forms in order for us  to release information regarding their care.   Consent: Patient/Guardian gives verbal consent for treatment and assignment of benefits for services provided during this visit. Patient/Guardian expressed understanding and agreed to proceed.    Katheren Sleet, MD 08/13/2024, 3:24 PM     [1]  Allergies Allergen Reactions   Advil [Ibuprofen] Swelling    Swelling of face and hands   Derma Guard [Protective Adhesive Powder]     Dermabond - urticaria

## 2024-08-13 ENCOUNTER — Telehealth: Payer: PRIVATE HEALTH INSURANCE | Admitting: Psychiatry

## 2024-08-13 ENCOUNTER — Encounter: Payer: Self-pay | Admitting: Psychiatry

## 2024-08-13 DIAGNOSIS — F419 Anxiety disorder, unspecified: Secondary | ICD-10-CM

## 2024-08-13 DIAGNOSIS — F3341 Major depressive disorder, recurrent, in partial remission: Secondary | ICD-10-CM

## 2024-08-13 MED ORDER — BUPROPION HCL ER (XL) 300 MG PO TB24: 300.0000 mg | ORAL_TABLET | Freq: Every day | ORAL | 1 refills | Status: AC

## 2024-08-13 MED ORDER — BUPROPION HCL ER (XL) 150 MG PO TB24: 150.0000 mg | ORAL_TABLET | Freq: Every day | ORAL | 0 refills | Status: AC

## 2024-08-22 ENCOUNTER — Other Ambulatory Visit (HOSPITAL_BASED_OUTPATIENT_CLINIC_OR_DEPARTMENT_OTHER): Admitting: Radiology

## 2024-10-01 ENCOUNTER — Telehealth: Payer: PRIVATE HEALTH INSURANCE | Admitting: Psychiatry
